# Patient Record
Sex: Female | Born: 1945 | Race: Black or African American | Hispanic: No | Smoking: Former smoker
Health system: Southern US, Community
[De-identification: ages and names within clinical notes are randomized; demographics above are authoritative.]

## PROBLEM LIST (undated history)

## (undated) DIAGNOSIS — J449 Chronic obstructive pulmonary disease, unspecified: Secondary | ICD-10-CM

## (undated) DIAGNOSIS — J189 Pneumonia, unspecified organism: Secondary | ICD-10-CM

## (undated) DIAGNOSIS — R51 Headache: Secondary | ICD-10-CM

## (undated) DIAGNOSIS — I10 Essential (primary) hypertension: Secondary | ICD-10-CM

## (undated) DIAGNOSIS — E119 Type 2 diabetes mellitus without complications: Secondary | ICD-10-CM

## (undated) DIAGNOSIS — N390 Urinary tract infection, site not specified: Secondary | ICD-10-CM

## (undated) DIAGNOSIS — G4730 Sleep apnea, unspecified: Secondary | ICD-10-CM

## (undated) DIAGNOSIS — R519 Headache, unspecified: Secondary | ICD-10-CM

## (undated) DIAGNOSIS — G4733 Obstructive sleep apnea (adult) (pediatric): Secondary | ICD-10-CM

## (undated) DIAGNOSIS — D649 Anemia, unspecified: Secondary | ICD-10-CM

## (undated) DIAGNOSIS — M1990 Unspecified osteoarthritis, unspecified site: Secondary | ICD-10-CM

## (undated) DIAGNOSIS — J45909 Unspecified asthma, uncomplicated: Secondary | ICD-10-CM

## (undated) HISTORY — PX: FOOT SURGERY: SHX648

## (undated) HISTORY — DX: Obstructive sleep apnea (adult) (pediatric): G47.33

---

## 2004-02-20 ENCOUNTER — Encounter (INDEPENDENT_AMBULATORY_CARE_PROVIDER_SITE_OTHER): Payer: Self-pay | Admitting: *Deleted

## 2004-02-20 LAB — CONVERTED CEMR LAB

## 2004-03-20 ENCOUNTER — Encounter: Admission: RE | Admit: 2004-03-20 | Discharge: 2004-03-20 | Payer: Self-pay | Admitting: Family Medicine

## 2004-04-05 ENCOUNTER — Encounter: Admission: RE | Admit: 2004-04-05 | Discharge: 2004-04-05 | Payer: Self-pay | Admitting: Sports Medicine

## 2004-04-11 ENCOUNTER — Encounter: Admission: RE | Admit: 2004-04-11 | Discharge: 2004-04-11 | Payer: Self-pay | Admitting: Sports Medicine

## 2004-04-22 ENCOUNTER — Encounter: Admission: RE | Admit: 2004-04-22 | Discharge: 2004-04-22 | Payer: Self-pay | Admitting: Sports Medicine

## 2004-04-24 ENCOUNTER — Encounter: Admission: RE | Admit: 2004-04-24 | Discharge: 2004-04-24 | Payer: Self-pay | Admitting: Family Medicine

## 2004-08-05 ENCOUNTER — Encounter: Admission: RE | Admit: 2004-08-05 | Discharge: 2004-08-05 | Payer: Self-pay | Admitting: Family Medicine

## 2004-11-28 ENCOUNTER — Encounter: Admission: RE | Admit: 2004-11-28 | Discharge: 2004-11-28 | Payer: Self-pay | Admitting: Occupational Medicine

## 2004-12-23 ENCOUNTER — Encounter: Admission: RE | Admit: 2004-12-23 | Discharge: 2005-01-08 | Payer: Self-pay | Admitting: Nurse Practitioner

## 2005-03-17 ENCOUNTER — Emergency Department (HOSPITAL_COMMUNITY): Admission: EM | Admit: 2005-03-17 | Discharge: 2005-03-17 | Payer: Self-pay | Admitting: Emergency Medicine

## 2005-03-21 ENCOUNTER — Ambulatory Visit (HOSPITAL_COMMUNITY): Admission: RE | Admit: 2005-03-21 | Discharge: 2005-03-21 | Payer: Self-pay | Admitting: Family Medicine

## 2005-03-21 ENCOUNTER — Emergency Department (HOSPITAL_COMMUNITY): Admission: EM | Admit: 2005-03-21 | Discharge: 2005-03-21 | Payer: Self-pay | Admitting: Emergency Medicine

## 2005-03-22 ENCOUNTER — Emergency Department (HOSPITAL_COMMUNITY): Admission: EM | Admit: 2005-03-22 | Discharge: 2005-03-22 | Payer: Self-pay | Admitting: Family Medicine

## 2005-03-31 ENCOUNTER — Ambulatory Visit: Payer: Self-pay | Admitting: Internal Medicine

## 2005-04-10 ENCOUNTER — Ambulatory Visit: Payer: Self-pay | Admitting: Internal Medicine

## 2005-04-16 ENCOUNTER — Encounter (HOSPITAL_COMMUNITY): Admission: RE | Admit: 2005-04-16 | Discharge: 2005-07-15 | Payer: Self-pay | Admitting: Internal Medicine

## 2005-05-27 ENCOUNTER — Ambulatory Visit: Payer: Self-pay | Admitting: *Deleted

## 2005-05-27 ENCOUNTER — Ambulatory Visit: Payer: Self-pay | Admitting: Internal Medicine

## 2005-06-03 ENCOUNTER — Ambulatory Visit: Payer: Self-pay | Admitting: Internal Medicine

## 2005-08-07 ENCOUNTER — Ambulatory Visit: Payer: Self-pay | Admitting: Internal Medicine

## 2006-01-14 ENCOUNTER — Ambulatory Visit: Payer: Self-pay | Admitting: Internal Medicine

## 2006-01-28 ENCOUNTER — Emergency Department (HOSPITAL_COMMUNITY): Admission: EM | Admit: 2006-01-28 | Discharge: 2006-01-28 | Payer: Self-pay | Admitting: Emergency Medicine

## 2006-03-20 ENCOUNTER — Ambulatory Visit: Payer: Self-pay | Admitting: Internal Medicine

## 2006-04-22 ENCOUNTER — Encounter: Admission: RE | Admit: 2006-04-22 | Discharge: 2006-06-17 | Payer: Self-pay | Admitting: Orthopedic Surgery

## 2006-08-20 ENCOUNTER — Ambulatory Visit: Payer: Self-pay | Admitting: Internal Medicine

## 2006-10-19 ENCOUNTER — Emergency Department (HOSPITAL_COMMUNITY): Admission: EM | Admit: 2006-10-19 | Discharge: 2006-10-19 | Payer: Self-pay | Admitting: Emergency Medicine

## 2006-11-23 ENCOUNTER — Ambulatory Visit: Payer: Self-pay | Admitting: Internal Medicine

## 2006-12-30 ENCOUNTER — Emergency Department (HOSPITAL_COMMUNITY): Admission: EM | Admit: 2006-12-30 | Discharge: 2006-12-30 | Payer: Self-pay | Admitting: Emergency Medicine

## 2007-01-25 ENCOUNTER — Ambulatory Visit: Payer: Self-pay | Admitting: Internal Medicine

## 2007-02-18 DIAGNOSIS — Z87891 Personal history of nicotine dependence: Secondary | ICD-10-CM | POA: Insufficient documentation

## 2007-02-18 DIAGNOSIS — M545 Low back pain, unspecified: Secondary | ICD-10-CM | POA: Insufficient documentation

## 2007-02-18 DIAGNOSIS — J309 Allergic rhinitis, unspecified: Secondary | ICD-10-CM | POA: Insufficient documentation

## 2007-02-18 DIAGNOSIS — M79609 Pain in unspecified limb: Secondary | ICD-10-CM | POA: Insufficient documentation

## 2007-02-18 DIAGNOSIS — F17200 Nicotine dependence, unspecified, uncomplicated: Secondary | ICD-10-CM | POA: Insufficient documentation

## 2007-02-18 DIAGNOSIS — J45909 Unspecified asthma, uncomplicated: Secondary | ICD-10-CM | POA: Insufficient documentation

## 2007-02-19 ENCOUNTER — Encounter (INDEPENDENT_AMBULATORY_CARE_PROVIDER_SITE_OTHER): Payer: Self-pay | Admitting: *Deleted

## 2007-07-29 ENCOUNTER — Ambulatory Visit: Payer: Self-pay | Admitting: Internal Medicine

## 2007-07-29 LAB — CONVERTED CEMR LAB
ALT: 22 units/L (ref 0–35)
AST: 17 units/L (ref 0–37)
Albumin: 3.8 g/dL (ref 3.5–5.2)
Alkaline Phosphatase: 156 units/L — ABNORMAL HIGH (ref 39–117)
BUN: 13 mg/dL (ref 6–23)
CO2: 23 meq/L (ref 19–32)
Calcium: 9.7 mg/dL (ref 8.4–10.5)
Chloride: 102 meq/L (ref 96–112)
Creatinine, Ser: 0.62 mg/dL (ref 0.40–1.20)
Free Thyroxine Index: 10.9 — ABNORMAL HIGH (ref 1.0–3.9)
Glucose, Bld: 355 mg/dL — ABNORMAL HIGH (ref 70–99)
Microalb, Ur: 0.55 mg/dL (ref 0.00–1.89)
Potassium: 4.4 meq/L (ref 3.5–5.3)
Sodium: 138 meq/L (ref 135–145)
T3 Uptake Ratio: 53 % — ABNORMAL HIGH (ref 22.5–37.0)
T4, Total: 20.6 ug/dL — ABNORMAL HIGH (ref 5.0–12.5)
TSH: 0.004 microintl units/mL — ABNORMAL LOW (ref 0.350–5.50)
Total Bilirubin: 0.3 mg/dL (ref 0.3–1.2)
Total Protein: 7.1 g/dL (ref 6.0–8.3)

## 2007-08-02 ENCOUNTER — Ambulatory Visit (HOSPITAL_COMMUNITY): Admission: RE | Admit: 2007-08-02 | Discharge: 2007-08-02 | Payer: Self-pay | Admitting: Internal Medicine

## 2007-08-19 ENCOUNTER — Ambulatory Visit: Payer: Self-pay | Admitting: Internal Medicine

## 2007-09-08 ENCOUNTER — Encounter (INDEPENDENT_AMBULATORY_CARE_PROVIDER_SITE_OTHER): Payer: Self-pay | Admitting: *Deleted

## 2007-12-06 ENCOUNTER — Ambulatory Visit: Payer: Self-pay | Admitting: Internal Medicine

## 2007-12-07 ENCOUNTER — Encounter: Payer: Self-pay | Admitting: Internal Medicine

## 2007-12-07 LAB — CONVERTED CEMR LAB
ALT: 40 units/L — ABNORMAL HIGH (ref 0–35)
AST: 29 units/L (ref 0–37)
Albumin: 4.3 g/dL (ref 3.5–5.2)
Alkaline Phosphatase: 186 units/L — ABNORMAL HIGH (ref 39–117)
BUN: 17 mg/dL (ref 6–23)
Basophils Absolute: 0 10*3/uL (ref 0.0–0.1)
Basophils Relative: 0 % (ref 0–1)
CO2: 25 meq/L (ref 19–32)
Calcium: 10.1 mg/dL (ref 8.4–10.5)
Chloride: 104 meq/L (ref 96–112)
Creatinine, Ser: 0.54 mg/dL (ref 0.40–1.20)
Eosinophils Absolute: 0.4 10*3/uL (ref 0.2–0.7)
Eosinophils Relative: 4 % (ref 0–5)
Free T4: 4.37 ng/dL — ABNORMAL HIGH (ref 0.89–1.80)
Glucose, Bld: 104 mg/dL — ABNORMAL HIGH (ref 70–99)
HCT: 43.6 % (ref 36.0–46.0)
Hemoglobin: 14.4 g/dL (ref 12.0–15.0)
Lymphocytes Relative: 33 % (ref 12–46)
Lymphs Abs: 3.4 10*3/uL (ref 0.7–4.0)
MCHC: 33 g/dL (ref 30.0–36.0)
MCV: 82.4 fL (ref 78.0–100.0)
Monocytes Absolute: 0.9 10*3/uL (ref 0.1–1.0)
Monocytes Relative: 9 % (ref 3–12)
Neutro Abs: 5.5 10*3/uL (ref 1.7–7.7)
Neutrophils Relative %: 54 % (ref 43–77)
Platelets: 251 10*3/uL (ref 150–400)
Potassium: 4.4 meq/L (ref 3.5–5.3)
RBC: 5.29 M/uL — ABNORMAL HIGH (ref 3.87–5.11)
RDW: 13.9 % (ref 11.5–15.5)
Sodium: 141 meq/L (ref 135–145)
TSH: <0.004 microintl units/mL — ABNORMAL LOW (ref 0.350–5.50)
Total Bilirubin: 0.4 mg/dL (ref 0.3–1.2)
Total Protein: 8 g/dL (ref 6.0–8.3)
WBC: 10.1 10*3/uL (ref 4.0–10.5)

## 2007-12-14 ENCOUNTER — Ambulatory Visit: Payer: Self-pay | Admitting: Internal Medicine

## 2007-12-21 ENCOUNTER — Ambulatory Visit: Payer: Self-pay | Admitting: Internal Medicine

## 2007-12-21 LAB — CONVERTED CEMR LAB
Free Thyroxine Index: 5.3 — ABNORMAL HIGH (ref 1.0–3.9)
T3 Uptake Ratio: 39.8 % — ABNORMAL HIGH (ref 22.5–37.0)
T4, Total: 13.3 ug/dL — ABNORMAL HIGH (ref 5.0–12.5)
TSH: 0.008 microintl units/mL — ABNORMAL LOW (ref 0.350–5.50)

## 2008-01-07 ENCOUNTER — Ambulatory Visit: Payer: Self-pay | Admitting: Internal Medicine

## 2008-01-07 LAB — CONVERTED CEMR LAB
Free Thyroxine Index: 5.4 — ABNORMAL HIGH (ref 1.0–3.9)
T3 Uptake Ratio: 37.4 % — ABNORMAL HIGH (ref 22.5–37.0)
T4, Total: 14.5 ug/dL — ABNORMAL HIGH (ref 5.0–12.5)
TSH: <0.004 microintl units/mL — ABNORMAL LOW (ref 0.350–5.50)

## 2008-03-09 ENCOUNTER — Ambulatory Visit: Payer: Self-pay | Admitting: Internal Medicine

## 2008-03-09 LAB — CONVERTED CEMR LAB
ALT: 20 units/L (ref 0–35)
AST: 22 units/L (ref 0–37)
Albumin: 4.7 g/dL (ref 3.5–5.2)
Alkaline Phosphatase: 209 units/L — ABNORMAL HIGH (ref 39–117)
BUN: 17 mg/dL (ref 6–23)
CO2: 24 meq/L (ref 19–32)
Calcium: 10.1 mg/dL (ref 8.4–10.5)
Chloride: 103 meq/L (ref 96–112)
Creatinine, Ser: 0.57 mg/dL (ref 0.40–1.20)
Free T4: 1.64 ng/dL (ref 0.89–1.80)
Glucose, Bld: 120 mg/dL — ABNORMAL HIGH (ref 70–99)
Potassium: 4.7 meq/L (ref 3.5–5.3)
Sodium: 137 meq/L (ref 135–145)
TSH: 0.014 microintl units/mL — ABNORMAL LOW (ref 0.350–5.50)
Total Bilirubin: 0.5 mg/dL (ref 0.3–1.2)
Total Protein: 8.4 g/dL — ABNORMAL HIGH (ref 6.0–8.3)

## 2008-05-08 ENCOUNTER — Ambulatory Visit: Payer: Self-pay | Admitting: Internal Medicine

## 2008-05-16 ENCOUNTER — Ambulatory Visit: Payer: Self-pay | Admitting: Internal Medicine

## 2008-05-25 ENCOUNTER — Ambulatory Visit (HOSPITAL_COMMUNITY): Admission: RE | Admit: 2008-05-25 | Discharge: 2008-05-25 | Payer: Self-pay | Admitting: Internal Medicine

## 2009-12-28 ENCOUNTER — Ambulatory Visit: Payer: Self-pay | Admitting: Family Medicine

## 2009-12-28 ENCOUNTER — Encounter (INDEPENDENT_AMBULATORY_CARE_PROVIDER_SITE_OTHER): Payer: Self-pay | Admitting: Family Medicine

## 2009-12-28 LAB — CONVERTED CEMR LAB
ALT: 19 units/L (ref 0–35)
AST: 20 units/L (ref 0–37)
Albumin: 4.5 g/dL (ref 3.5–5.2)
Alkaline Phosphatase: 84 units/L (ref 39–117)
BUN: 10 mg/dL (ref 6–23)
Basophils Absolute: 0.1 10*3/uL (ref 0.0–0.1)
Basophils Relative: 1 % (ref 0–1)
CO2: 24 meq/L (ref 19–32)
Calcium: 9.1 mg/dL (ref 8.4–10.5)
Chloride: 103 meq/L (ref 96–112)
Creatinine, Ser: 0.7 mg/dL (ref 0.40–1.20)
Eosinophils Absolute: 0.3 10*3/uL (ref 0.0–0.7)
Eosinophils Relative: 4 % (ref 0–5)
Glucose, Bld: 201 mg/dL — ABNORMAL HIGH (ref 70–99)
HCT: 41 % (ref 36.0–46.0)
Hemoglobin: 13.2 g/dL (ref 12.0–15.0)
Hgb A1c MFr Bld: 8.7 % — ABNORMAL HIGH (ref 4.6–6.1)
Lymphocytes Relative: 35 % (ref 12–46)
Lymphs Abs: 3.1 10*3/uL (ref 0.7–4.0)
MCHC: 32.2 g/dL (ref 30.0–36.0)
MCV: 91.3 fL (ref 78.0–100.0)
Monocytes Absolute: 0.7 10*3/uL (ref 0.1–1.0)
Monocytes Relative: 8 % (ref 3–12)
Neutro Abs: 4.6 10*3/uL (ref 1.7–7.7)
Neutrophils Relative %: 53 % (ref 43–77)
Platelets: 210 10*3/uL (ref 150–400)
Potassium: 4.1 meq/L (ref 3.5–5.3)
RBC: 4.49 M/uL (ref 3.87–5.11)
RDW: 13.3 % (ref 11.5–15.5)
Sodium: 139 meq/L (ref 135–145)
T3 Uptake Ratio: 34.5 % (ref 22.5–37.0)
T4, Total: 10.6 ug/dL (ref 5.0–12.5)
TSH: 1.176 microintl units/mL (ref 0.350–4.500)
Total Bilirubin: 0.4 mg/dL (ref 0.3–1.2)
Total Protein: 7.6 g/dL (ref 6.0–8.3)
Vit D, 25-Hydroxy: 17 ng/mL — ABNORMAL LOW (ref 30–89)
WBC: 8.7 10*3/uL (ref 4.0–10.5)

## 2010-01-11 ENCOUNTER — Ambulatory Visit: Payer: Self-pay | Admitting: Internal Medicine

## 2010-01-11 LAB — CONVERTED CEMR LAB
Cholesterol: 184 mg/dL (ref 0–200)
HDL: 46 mg/dL (ref >39)
LDL Cholesterol: 113 mg/dL — ABNORMAL HIGH (ref 0–99)
Total CHOL/HDL Ratio: 4
Triglycerides: 125 mg/dL (ref <150)
VLDL: 25 mg/dL (ref 0–40)

## 2010-04-08 ENCOUNTER — Ambulatory Visit: Payer: Self-pay | Admitting: Family Medicine

## 2016-12-22 DIAGNOSIS — N390 Urinary tract infection, site not specified: Secondary | ICD-10-CM

## 2016-12-22 HISTORY — DX: Urinary tract infection, site not specified: N39.0

## 2017-06-29 ENCOUNTER — Encounter: Payer: Self-pay | Admitting: Emergency Medicine

## 2017-06-29 ENCOUNTER — Inpatient Hospital Stay (HOSPITAL_COMMUNITY)
Admission: EM | Admit: 2017-06-29 | Discharge: 2017-07-04 | DRG: 871 | Disposition: A | Discharge status: Home / Self Care | Payer: Medicare (Managed Care) | Attending: Internal Medicine | Admitting: Internal Medicine

## 2017-06-29 ENCOUNTER — Inpatient Hospital Stay (HOSPITAL_COMMUNITY): Payer: Medicare (Managed Care)

## 2017-06-29 ENCOUNTER — Emergency Department (HOSPITAL_COMMUNITY): Payer: Medicare (Managed Care)

## 2017-06-29 DIAGNOSIS — R1110 Vomiting, unspecified: Secondary | ICD-10-CM

## 2017-06-29 DIAGNOSIS — D649 Anemia, unspecified: Secondary | ICD-10-CM

## 2017-06-29 DIAGNOSIS — R531 Weakness: Secondary | ICD-10-CM | POA: Diagnosis present

## 2017-06-29 DIAGNOSIS — R400 Somnolence: Secondary | ICD-10-CM

## 2017-06-29 DIAGNOSIS — G4733 Obstructive sleep apnea (adult) (pediatric): Secondary | ICD-10-CM | POA: Diagnosis present

## 2017-06-29 DIAGNOSIS — M4316 Spondylolisthesis, lumbar region: Secondary | ICD-10-CM | POA: Diagnosis present

## 2017-06-29 DIAGNOSIS — D500 Iron deficiency anemia secondary to blood loss (chronic): Secondary | ICD-10-CM | POA: Diagnosis present

## 2017-06-29 DIAGNOSIS — K31819 Angiodysplasia of stomach and duodenum without bleeding: Secondary | ICD-10-CM | POA: Diagnosis present

## 2017-06-29 DIAGNOSIS — M5440 Lumbago with sciatica, unspecified side: Secondary | ICD-10-CM | POA: Diagnosis not present

## 2017-06-29 DIAGNOSIS — J440 Chronic obstructive pulmonary disease with acute lower respiratory infection: Secondary | ICD-10-CM | POA: Diagnosis present

## 2017-06-29 DIAGNOSIS — K5730 Diverticulosis of large intestine without perforation or abscess without bleeding: Secondary | ICD-10-CM | POA: Diagnosis present

## 2017-06-29 DIAGNOSIS — M545 Low back pain, unspecified: Secondary | ICD-10-CM

## 2017-06-29 DIAGNOSIS — N170 Acute kidney failure with tubular necrosis: Secondary | ICD-10-CM | POA: Diagnosis not present

## 2017-06-29 DIAGNOSIS — A4151 Sepsis due to Escherichia coli [E. coli]: Secondary | ICD-10-CM | POA: Diagnosis present

## 2017-06-29 DIAGNOSIS — R42 Dizziness and giddiness: Secondary | ICD-10-CM

## 2017-06-29 DIAGNOSIS — Z87891 Personal history of nicotine dependence: Secondary | ICD-10-CM

## 2017-06-29 DIAGNOSIS — J209 Acute bronchitis, unspecified: Secondary | ICD-10-CM | POA: Diagnosis present

## 2017-06-29 DIAGNOSIS — R059 Cough, unspecified: Secondary | ICD-10-CM

## 2017-06-29 DIAGNOSIS — E1169 Type 2 diabetes mellitus with other specified complication: Secondary | ICD-10-CM | POA: Diagnosis present

## 2017-06-29 DIAGNOSIS — E538 Deficiency of other specified B group vitamins: Secondary | ICD-10-CM | POA: Diagnosis present

## 2017-06-29 DIAGNOSIS — Z7982 Long term (current) use of aspirin: Secondary | ICD-10-CM

## 2017-06-29 DIAGNOSIS — K625 Hemorrhage of anus and rectum: Secondary | ICD-10-CM | POA: Diagnosis present

## 2017-06-29 DIAGNOSIS — K648 Other hemorrhoids: Secondary | ICD-10-CM | POA: Diagnosis present

## 2017-06-29 DIAGNOSIS — Z6834 Body mass index (BMI) 34.0-34.9, adult: Secondary | ICD-10-CM

## 2017-06-29 DIAGNOSIS — R7881 Bacteremia: Secondary | ICD-10-CM | POA: Diagnosis present

## 2017-06-29 DIAGNOSIS — E861 Hypovolemia: Secondary | ICD-10-CM | POA: Diagnosis present

## 2017-06-29 DIAGNOSIS — R05 Cough: Secondary | ICD-10-CM

## 2017-06-29 DIAGNOSIS — Z8249 Family history of ischemic heart disease and other diseases of the circulatory system: Secondary | ICD-10-CM

## 2017-06-29 DIAGNOSIS — K644 Residual hemorrhoidal skin tags: Secondary | ICD-10-CM | POA: Diagnosis present

## 2017-06-29 DIAGNOSIS — Z833 Family history of diabetes mellitus: Secondary | ICD-10-CM

## 2017-06-29 DIAGNOSIS — K921 Melena: Secondary | ICD-10-CM | POA: Diagnosis not present

## 2017-06-29 DIAGNOSIS — R6521 Severe sepsis with septic shock: Secondary | ICD-10-CM | POA: Diagnosis present

## 2017-06-29 DIAGNOSIS — A419 Sepsis, unspecified organism: Secondary | ICD-10-CM | POA: Diagnosis present

## 2017-06-29 DIAGNOSIS — E871 Hypo-osmolality and hyponatremia: Secondary | ICD-10-CM | POA: Diagnosis present

## 2017-06-29 DIAGNOSIS — R195 Other fecal abnormalities: Secondary | ICD-10-CM | POA: Diagnosis not present

## 2017-06-29 DIAGNOSIS — N179 Acute kidney failure, unspecified: Secondary | ICD-10-CM | POA: Diagnosis present

## 2017-06-29 DIAGNOSIS — N3000 Acute cystitis without hematuria: Secondary | ICD-10-CM | POA: Insufficient documentation

## 2017-06-29 DIAGNOSIS — N800 Endometriosis of uterus: Secondary | ICD-10-CM | POA: Diagnosis present

## 2017-06-29 DIAGNOSIS — I10 Essential (primary) hypertension: Secondary | ICD-10-CM | POA: Diagnosis present

## 2017-06-29 DIAGNOSIS — D508 Other iron deficiency anemias: Secondary | ICD-10-CM | POA: Diagnosis not present

## 2017-06-29 DIAGNOSIS — N390 Urinary tract infection, site not specified: Secondary | ICD-10-CM | POA: Diagnosis present

## 2017-06-29 DIAGNOSIS — R319 Hematuria, unspecified: Secondary | ICD-10-CM

## 2017-06-29 DIAGNOSIS — E119 Type 2 diabetes mellitus without complications: Secondary | ICD-10-CM | POA: Diagnosis present

## 2017-06-29 DIAGNOSIS — E1165 Type 2 diabetes mellitus with hyperglycemia: Secondary | ICD-10-CM | POA: Diagnosis not present

## 2017-06-29 DIAGNOSIS — D251 Intramural leiomyoma of uterus: Secondary | ICD-10-CM | POA: Diagnosis present

## 2017-06-29 DIAGNOSIS — D269 Other benign neoplasm of uterus, unspecified: Secondary | ICD-10-CM | POA: Diagnosis not present

## 2017-06-29 DIAGNOSIS — N939 Abnormal uterine and vaginal bleeding, unspecified: Secondary | ICD-10-CM

## 2017-06-29 DIAGNOSIS — E669 Obesity, unspecified: Secondary | ICD-10-CM | POA: Diagnosis present

## 2017-06-29 DIAGNOSIS — Z7984 Long term (current) use of oral hypoglycemic drugs: Secondary | ICD-10-CM

## 2017-06-29 DIAGNOSIS — D509 Iron deficiency anemia, unspecified: Secondary | ICD-10-CM | POA: Diagnosis not present

## 2017-06-29 HISTORY — DX: Type 2 diabetes mellitus without complications: E11.9

## 2017-06-29 LAB — URINALYSIS, ROUTINE W REFLEX MICROSCOPIC
Bilirubin Urine: NEGATIVE
GLUCOSE, UA: 50 mg/dL — AB
KETONES UR: NEGATIVE mg/dL
NITRITE: NEGATIVE
PROTEIN: 30 mg/dL — AB
Specific Gravity, Urine: 1.006 (ref 1.005–1.030)
pH: 5 (ref 5.0–8.0)

## 2017-06-29 LAB — I-STAT TROPONIN, ED: Troponin i, poc: 0.02 ng/mL (ref 0.00–0.08)

## 2017-06-29 LAB — BLOOD GAS, ARTERIAL
Acid-base deficit: 3.2 mmol/L — ABNORMAL HIGH (ref 0.0–2.0)
BICARBONATE: 20.3 mmol/L (ref 20.0–28.0)
Drawn by: 308601
FIO2: 21
O2 Saturation: 96.6 %
PATIENT TEMPERATURE: 98.6
PH ART: 7.435 (ref 7.350–7.450)
PO2 ART: 86.6 mmHg (ref 83.0–108.0)
pCO2 arterial: 30.8 mmHg — ABNORMAL LOW (ref 32.0–48.0)

## 2017-06-29 LAB — BASIC METABOLIC PANEL
Anion gap: 10 (ref 5–15)
BUN: 21 mg/dL — ABNORMAL HIGH (ref 6–20)
CO2: 22 mmol/L (ref 22–32)
CREATININE: 2.05 mg/dL — AB (ref 0.44–1.00)
Calcium: 8.2 mg/dL — ABNORMAL LOW (ref 8.9–10.3)
Chloride: 94 mmol/L — ABNORMAL LOW (ref 101–111)
GFR calc Af Amer: 28 mL/min — ABNORMAL LOW (ref >60)
GFR calc non Af Amer: 24 mL/min — ABNORMAL LOW (ref >60)
Glucose, Bld: 329 mg/dL — ABNORMAL HIGH (ref 65–99)
POTASSIUM: 4.2 mmol/L (ref 3.5–5.1)
Sodium: 126 mmol/L — ABNORMAL LOW (ref 135–145)

## 2017-06-29 LAB — IRON AND TIBC
IRON: 5 ug/dL — AB (ref 28–170)
Saturation Ratios: 1 % — ABNORMAL LOW (ref 10.4–31.8)
TIBC: 398 ug/dL (ref 250–450)
UIBC: 393 ug/dL

## 2017-06-29 LAB — LIPASE, BLOOD: LIPASE: 26 U/L (ref 11–51)

## 2017-06-29 LAB — FOLATE: FOLATE: 25.9 ng/mL (ref >5.9)

## 2017-06-29 LAB — CBC WITH DIFFERENTIAL/PLATELET
Basophils Absolute: 0 10*3/uL (ref 0.0–0.1)
Basophils Relative: 0 %
EOS ABS: 0 10*3/uL (ref 0.0–0.7)
Eosinophils Relative: 0 %
HCT: 17.5 % — ABNORMAL LOW (ref 36.0–46.0)
Hemoglobin: 5.1 g/dL — CL (ref 12.0–15.0)
Lymphocytes Relative: 4 %
Lymphs Abs: 0.9 10*3/uL (ref 0.7–4.0)
MCH: 17.6 pg — ABNORMAL LOW (ref 26.0–34.0)
MCHC: 29.1 g/dL — ABNORMAL LOW (ref 30.0–36.0)
MCV: 60.6 fL — ABNORMAL LOW (ref 78.0–100.0)
Monocytes Absolute: 2.6 10*3/uL — ABNORMAL HIGH (ref 0.1–1.0)
Monocytes Relative: 11 %
Neutro Abs: 20.1 10*3/uL — ABNORMAL HIGH (ref 1.7–7.7)
Neutrophils Relative %: 85 %
PLATELETS: 348 10*3/uL (ref 150–400)
RBC: 2.89 MIL/uL — ABNORMAL LOW (ref 3.87–5.11)
RDW: 18.2 % — ABNORMAL HIGH (ref 11.5–15.5)
WBC MORPHOLOGY: INCREASED
WBC: 23.6 10*3/uL — ABNORMAL HIGH (ref 4.0–10.5)

## 2017-06-29 LAB — HEPATIC FUNCTION PANEL
ALBUMIN: 2.8 g/dL — AB (ref 3.5–5.0)
ALK PHOS: 58 U/L (ref 38–126)
ALT: 20 U/L (ref 14–54)
AST: 34 U/L (ref 15–41)
BILIRUBIN TOTAL: 0.7 mg/dL (ref 0.3–1.2)
Bilirubin, Direct: 0.3 mg/dL (ref 0.1–0.5)
Indirect Bilirubin: 0.4 mg/dL (ref 0.3–0.9)
TOTAL PROTEIN: 6.2 g/dL — AB (ref 6.5–8.1)

## 2017-06-29 LAB — RETICULOCYTES
RBC.: 2.85 MIL/uL — ABNORMAL LOW (ref 3.87–5.11)
Retic Count, Absolute: 48.5 10*3/uL (ref 19.0–186.0)
Retic Ct Pct: 1.7 % (ref 0.4–3.1)

## 2017-06-29 LAB — APTT: APTT: 41 s — AB (ref 24–36)

## 2017-06-29 LAB — FERRITIN: FERRITIN: 32 ng/mL (ref 11–307)

## 2017-06-29 LAB — GLUCOSE, CAPILLARY
GLUCOSE-CAPILLARY: 185 mg/dL — AB (ref 65–99)
Glucose-Capillary: 265 mg/dL — ABNORMAL HIGH (ref 65–99)
Glucose-Capillary: 246 mg/dL — ABNORMAL HIGH (ref 65–99)

## 2017-06-29 LAB — HEMOGLOBIN AND HEMATOCRIT, BLOOD
HCT: 20.3 % — ABNORMAL LOW (ref 36.0–46.0)
HEMOGLOBIN: 6.1 g/dL — AB (ref 12.0–15.0)

## 2017-06-29 LAB — PREPARE RBC (CROSSMATCH)

## 2017-06-29 LAB — VITAMIN B12: Vitamin B-12: 335 pg/mL (ref 180–914)

## 2017-06-29 LAB — ABO/RH: ABO/RH(D): O POS

## 2017-06-29 LAB — MRSA PCR SCREENING: MRSA by PCR: NEGATIVE

## 2017-06-29 LAB — PROTIME-INR
INR: 1.22
PROTHROMBIN TIME: 15.5 s — AB (ref 11.4–15.2)

## 2017-06-29 LAB — LACTIC ACID, PLASMA
Lactic Acid, Venous: 2.2 mmol/L (ref 0.5–1.9)
Lactic Acid, Venous: 1.2 mmol/L (ref 0.5–1.9)

## 2017-06-29 LAB — POC OCCULT BLOOD, ED: Fecal Occult Bld: NEGATIVE

## 2017-06-29 LAB — PROCALCITONIN: PROCALCITONIN: 8.98 ng/mL

## 2017-06-29 MED ORDER — PIPERACILLIN-TAZOBACTAM 3.375 G IVPB 30 MIN: 3.3750 g | Freq: Once | INTRAVENOUS | Status: DC

## 2017-06-29 MED ORDER — SODIUM CHLORIDE 0.9 % IV SOLN
Freq: Once | INTRAVENOUS | Status: AC
  Administered 2017-06-29: 22:00:00 via INTRAVENOUS

## 2017-06-29 MED ORDER — ONDANSETRON HCL 4 MG/2ML IJ SOLN: 4.0000 mg | Freq: Four times a day (QID) | INTRAMUSCULAR | Status: DC | PRN

## 2017-06-29 MED ORDER — AZITHROMYCIN 500 MG IV SOLR
500.0000 mg | Freq: Once | INTRAVENOUS | Status: AC
  Administered 2017-06-29: 500 mg via INTRAVENOUS
  Filled 2017-06-29: qty 500

## 2017-06-29 MED ORDER — ACETAMINOPHEN 325 MG PO TABS
650.0000 mg | ORAL_TABLET | Freq: Four times a day (QID) | ORAL | Status: DC | PRN
  Administered 2017-06-29 – 2017-07-02 (×6): 650 mg via ORAL
  Filled 2017-06-29 (×5): qty 2

## 2017-06-29 MED ORDER — SODIUM CHLORIDE 0.9 % IV SOLN
10.0000 mL/h | Freq: Once | INTRAVENOUS | Status: AC
  Administered 2017-06-29: 10 mL/h via INTRAVENOUS

## 2017-06-29 MED ORDER — IPRATROPIUM-ALBUTEROL 0.5-2.5 (3) MG/3ML IN SOLN: 3.0000 mL | Freq: Four times a day (QID) | RESPIRATORY_TRACT | Status: DC | PRN

## 2017-06-29 MED ORDER — ONDANSETRON HCL 4 MG/2ML IJ SOLN
4.0000 mg | Freq: Once | INTRAMUSCULAR | Status: AC
  Administered 2017-06-29: 4 mg via INTRAVENOUS
  Filled 2017-06-29: qty 2

## 2017-06-29 MED ORDER — ONDANSETRON HCL 4 MG PO TABS: 4.0000 mg | ORAL_TABLET | Freq: Four times a day (QID) | ORAL | Status: DC | PRN

## 2017-06-29 MED ORDER — SODIUM CHLORIDE 0.9 % IV BOLUS (SEPSIS)
500.0000 mL | Freq: Once | INTRAVENOUS | Status: AC
  Administered 2017-06-29: 500 mL via INTRAVENOUS

## 2017-06-29 MED ORDER — SODIUM CHLORIDE 0.9 % IV BOLUS (SEPSIS)
2000.0000 mL | Freq: Once | INTRAVENOUS | Status: AC
  Administered 2017-06-29: 2000 mL via INTRAVENOUS

## 2017-06-29 MED ORDER — DEXTROSE 5 % IV SOLN
1.0000 g | Freq: Once | INTRAVENOUS | Status: AC
  Administered 2017-06-29: 1 g via INTRAVENOUS
  Filled 2017-06-29: qty 10

## 2017-06-29 MED ORDER — SODIUM CHLORIDE 0.9 % IV SOLN
INTRAVENOUS | Status: AC
  Administered 2017-06-29 (×2): 100 mL/h via INTRAVENOUS

## 2017-06-29 MED ORDER — SODIUM CHLORIDE 0.9 % IV BOLUS (SEPSIS): 2000.0000 mL | Freq: Once | INTRAVENOUS | Status: DC

## 2017-06-29 MED ORDER — DEXTROSE 5 % IV SOLN
500.0000 mg | INTRAVENOUS | Status: DC
  Administered 2017-06-30: 500 mg via INTRAVENOUS
  Filled 2017-06-29: qty 500

## 2017-06-29 MED ORDER — ALBUTEROL SULFATE (2.5 MG/3ML) 0.083% IN NEBU
5.0000 mg | INHALATION_SOLUTION | Freq: Once | RESPIRATORY_TRACT | Status: AC
  Administered 2017-06-29: 5 mg via RESPIRATORY_TRACT
  Filled 2017-06-29: qty 6

## 2017-06-29 MED ORDER — LIP MEDEX EX OINT
TOPICAL_OINTMENT | CUTANEOUS | Status: AC
  Administered 2017-06-29: 14:00:00
  Filled 2017-06-29: qty 7

## 2017-06-29 MED ORDER — INSULIN ASPART 100 UNIT/ML ~~LOC~~ SOLN
0.0000 [IU] | Freq: Three times a day (TID) | SUBCUTANEOUS | Status: DC
  Administered 2017-06-29: 5 [IU] via SUBCUTANEOUS
  Administered 2017-06-29 – 2017-06-30 (×4): 3 [IU] via SUBCUTANEOUS
  Administered 2017-07-01: 2 [IU] via SUBCUTANEOUS

## 2017-06-29 MED ORDER — DEXTROSE 5 % IV SOLN
1.0000 g | INTRAVENOUS | Status: DC
  Administered 2017-06-30: 1 g via INTRAVENOUS
  Filled 2017-06-29 (×2): qty 10

## 2017-06-29 MED ORDER — ACETAMINOPHEN 325 MG PO TABS
650.0000 mg | ORAL_TABLET | Freq: Once | ORAL | Status: AC
  Administered 2017-06-29: 650 mg via ORAL
  Filled 2017-06-29: qty 2

## 2017-06-29 MED ORDER — ACETAMINOPHEN 650 MG RE SUPP: 650.0000 mg | Freq: Four times a day (QID) | RECTAL | Status: DC | PRN

## 2017-06-29 MED ORDER — VANCOMYCIN HCL IN DEXTROSE 1-5 GM/200ML-% IV SOLN: 1000.0000 mg | Freq: Once | INTRAVENOUS | Status: DC

## 2017-06-29 NOTE — ED Notes (Signed)
Second set of blood cultures sent. *Ordered and collected after pt had antibiotics*

## 2017-06-29 NOTE — ED Notes (Signed)
Patient transported to CT 

## 2017-06-29 NOTE — ED Notes (Signed)
Called 4W, per secretary, bed not approved yet. Will check again in about 15 min.

## 2017-06-29 NOTE — ED Notes (Signed)
Bed: HC62 Expected date:  Expected time:  Means of arrival:  Comments: 71 yo F/Fever N/V

## 2017-06-29 NOTE — ED Provider Notes (Signed)
[facility] DEPT Provider Note   CSN: [id number] Arrival date & time: 06/29/17  0022  By signing my name below, I, Ny'Kea Lewis, attest that this documentation has been prepared under the direction and in the presence of Plato, Aleksey Newbern, MD. Electronically Signed: Lise Auer, ED Scribe. 06/29/17. 2:08 AM.  History   Chief Complaint Chief Complaint  Patient presents with  . Nausea  . Emesis   The history is provided by the patient and a relative. No language interpreter was used.  Cough  This is a chronic problem. The current episode started more than 1 week ago. The problem occurs constantly. The problem has not changed since onset.The cough is productive of sputum. There has been no fever. Pertinent negatives include no chest pain and no wheezing. She has tried nothing for the symptoms. The treatment provided no relief. She is a smoker. Her past medical history does not include pneumonia.   HPI Comments: [name] [name] is a 71 y.o. female with a history of diabetes, brought in by ambulance, who presents to the Emergency Department complaining of acute on chronic vomiting for the past two months. Pt notes associated fatigue, decreased appetite, cough, and a subjective fever. Pt reports she has been vomiting and coughing up thick mucus for the past two months. Pt's accompanying party suggested that pt come in. She reports that the pt has been sleeping all day and had a decreased appetite. Pt is currently on Ramipril. She has note taken any medication to relieve her systems. Denies tobacco or alcohol usage. Denies melena, hematochezia, or blood in stool.  Past Medical History:  Diagnosis Date  . Diabetes mellitus without complication [facility])    Patient Active Problem List   Diagnosis Date Noted  . TOBACCO DEPENDENCE 02/18/2007  . RHINITIS, ALLERGIC 02/18/2007  . ASTHMA, UNSPECIFIED 02/18/2007  . BACK PAIN, LOW 02/18/2007  . LEG PAIN OR KNEE PAIN 02/18/2007   History reviewed. No  pertinent surgical history.  OB History    No data available     Home Medications    Prior to Admission medications   Not on File   Family History No family history on file.  Social History Social History  Substance Use Topics  . Smoking status: Not on file  . Smokeless tobacco: Not on file  . Alcohol use Not on file   Allergies   Patient has no allergy information on record.  Review of Systems Review of Systems  Constitutional: Positive for fatigue.  Respiratory: Positive for cough. Negative for wheezing.   Cardiovascular: Negative for chest pain.  Gastrointestinal: Positive for vomiting. Negative for blood in stool.  Genitourinary: Negative for dysuria.  All other systems reviewed and are negative.  Physical Exam Updated Vital Signs BP (!) 96/49 (BP Location: Left Arm)   Pulse (!) 113   Temp 99.7 F (37.6 C) (Oral)   Resp 16   SpO2 (!) 89%   Physical Exam  Constitutional: She appears well-developed and well-nourished.  HENT:  Head: Normocephalic.  Mouth/Throat: Oropharynx is clear and moist. No oropharyngeal exudate.  Eyes: Conjunctivae and EOM are normal. Pupils are equal, round, and reactive to light. Right eye exhibits no discharge. Left eye exhibits no discharge. No scleral icterus.  Neck: Normal range of motion. Neck supple. No JVD present. No tracheal deviation present.  Trachea is midline. No stridor or carotid bruits.   Cardiovascular: Normal rate, regular rhythm, normal heart sounds and intact distal pulses.   No murmur heard. Diminished heart sounds.  Pulmonary/Chest: Effort normal and breath sounds normal. No stridor. Tachypnea noted. No respiratory distress. She has no wheezes. She has no rales.  Diminished breath sounds.   Abdominal: Soft. Bowel sounds are normal. She exhibits no distension. There is no tenderness. There is no rebound and no guarding.  Genitourinary: Rectal exam shows guaiac negative stool.  Genitourinary Comments: Chaperone  present. No gross blood. Rectum exam was normal.   Musculoskeletal: Normal range of motion. She exhibits no edema () or tenderness.  All compartments are soft. No palpable cords.   Lymphadenopathy:    She has no cervical adenopathy.  Neurological: She is alert. She has normal reflexes. She displays normal reflexes. She exhibits normal muscle tone.  Skin: Skin is warm and dry. Capillary refill takes less than 2 seconds. There is pallor.  Psychiatric: She has a normal mood and affect. Her behavior is normal.  Nursing note and vitals reviewed.  ED Treatments / Results   Vitals:   06/29/17 0430 06/29/17 0445  BP: (!) 97/56 98/68  Pulse: (!) 112 (!) 108  Resp: (!) 26 (!) 26  Temp:      DIAGNOSTIC STUDIES: Oxygen Saturation is 89% on RA, low by my interpretation.   COORDINATION OF CARE: 1:54 AM-Discussed next steps with pt. Pt verbalized understanding and is agreeable with the plan.   Labs (all labs ordered are listed, but only abnormal results are displayed)  Results for orders placed or performed during the hospital encounter of 06/29/17  CBC with Differential  Result Value Ref Range   WBC 23.6 (H) 4.0 - 10.5 K/uL   RBC 2.89 (L) 3.87 - 5.11 MIL/uL   Hemoglobin 5.1 (LL) 12.0 - 15.0 g/dL   HCT 17.5 (L) 36.0 - 46.0 %   MCV 60.6 (L) 78.0 - 100.0 fL   MCH 17.6 (L) 26.0 - 34.0 pg   MCHC 29.1 (L) 30.0 - 36.0 g/dL   RDW 18.2 (H) 11.5 - 15.5 %   Platelets 348 150 - 400 K/uL   Neutrophils Relative % 85 %   Lymphocytes Relative 4 %   Monocytes Relative 11 %   Eosinophils Relative 0 %   Basophils Relative 0 %   Neutro Abs 20.1 (H) 1.7 - 7.7 K/uL   Lymphs Abs 0.9 0.7 - 4.0 K/uL   Monocytes Absolute 2.6 (H) 0.1 - 1.0 K/uL   Eosinophils Absolute 0.0 0.0 - 0.7 K/uL   Basophils Absolute 0.0 0.0 - 0.1 K/uL   RBC Morphology POLYCHROMASIA PRESENT    WBC Morphology INCREASED BANDS (>20% BANDS)   Basic metabolic panel  Result Value Ref Range   Sodium 126 (L) 135 - 145 mmol/L    Potassium 4.2 3.5 - 5.1 mmol/L   Chloride 94 (L) 101 - 111 mmol/L   CO2 22 22 - 32 mmol/L   Glucose, Bld 329 (H) 65 - 99 mg/dL   BUN 21 (H) 6 - 20 mg/dL   Creatinine, Ser 2.05 (H) 0.44 - 1.00 mg/dL   Calcium 8.2 (L) 8.9 - 10.3 mg/dL   GFR calc non Af Amer 24 (L) >60 mL/min   GFR calc Af Amer 28 (L) >60 mL/min   Anion gap 10 5 - 15  Lipase, blood  Result Value Ref Range   Lipase 26 11 - 51 U/L  Urinalysis, Routine w reflex microscopic  Result Value Ref Range   Color, Urine YELLOW YELLOW   APPearance CLOUDY (A) CLEAR   Specific Gravity, Urine 1.006 1.005 - 1.030   pH 5.0  5.0 - 8.0   Glucose, UA 50 (A) NEGATIVE mg/dL   Hgb urine dipstick MODERATE (A) NEGATIVE   Bilirubin Urine NEGATIVE NEGATIVE   Ketones, ur NEGATIVE NEGATIVE mg/dL   Protein, ur 30 (A) NEGATIVE mg/dL   Nitrite NEGATIVE NEGATIVE   Leukocytes, UA LARGE (A) NEGATIVE   RBC / HPF 6-30 0 - 5 RBC/hpf   WBC, UA TOO NUMEROUS TO COUNT 0 - 5 WBC/hpf   Bacteria, UA MANY (A) NONE SEEN   Squamous Epithelial / LPF 0-5 (A) NONE SEEN   Mucous PRESENT    Hyaline Casts, UA PRESENT   Blood gas, arterial ([facility] & [facility] ONLY)  Result Value Ref Range   FIO2 21.00    Mode PRESSURE REGULATED VOLUME CONTROL    pH, Arterial 7.435 7.350 - 7.450   pCO2 arterial 30.8 (L) 32.0 - 48.0 mmHg   pO2, Arterial 86.6 83.0 - 108.0 mmHg   Bicarbonate 20.3 20.0 - 28.0 mmol/L   Acid-base deficit 3.2 (H) 0.0 - 2.0 mmol/L   O2 Saturation 96.6 %   Patient temperature 98.6    Collection site LEFT RADIAL    Drawn by [id number]    Sample type ARTERIAL DRAW    Allens test (pass/fail) PASS PASS  POC occult blood, ED  Result Value Ref Range   Fecal Occult Bld NEGATIVE NEGATIVE  I-stat troponin, ED  Result Value Ref Range   Troponin i, poc 0.02 0.00 - 0.08 ng/mL   Comment 3          Type and screen [place]  Result Value Ref Range   ABO/RH(D) O POS    Antibody Screen NEG    Sample Expiration 07/02/2017    Unit Number [id number]     Blood Component Type RED CELLS,LR    Unit division 00    Status of Unit ALLOCATED    Transfusion Status OK TO TRANSFUSE    Crossmatch Result Compatible    Unit Number [id number]    Blood Component Type RED CELLS,LR    Unit division 00    Status of Unit ALLOCATED    Transfusion Status OK TO TRANSFUSE    Crossmatch Result Compatible   Prepare RBC  Result Value Ref Range   Order Confirmation ORDER PROCESSED BY BLOOD BANK   ABO/Rh  Result Value Ref Range   ABO/RH(D) O POS   BPAM RBC  Result Value Ref Range   Blood Product Unit Number [id number]    Unit Type and Rh 5100    Blood Product Expiration Date 301601093235    Blood Product Unit Number [id number]    Unit Type and Rh 5100    Blood Product Expiration Date [id number]    Dg Abd Acute W/chest  Result Date: 06/29/2017 CLINICAL DATA:  71 year old female with weakness and mid abdominal pain. EXAM: DG ABDOMEN ACUTE W/ 1V CHEST COMPARISON:  Chest radiograph dated 08/02/2007 FINDINGS: Mild diffuse interstitial coarsening primarily involving the lung bases. No focal consolidation, pleural effusion, or pneumothorax. The cardiac silhouette is within normal limits. There degenerative changes of the shoulders. No acute osseous pathology. No bowel dilatation or evidence of obstruction. No free air or radiopaque calculi. The osseous structures and soft tissues appear unremarkable. IMPRESSION: Negative abdominal radiographs.  No acute cardiopulmonary disease. Electronically Signed   By: Anner Crete M.D.   On: 06/29/2017 01:51     EKG Interpretation  Date/Time:  Monday June 29 2017 02:54:57 EDT Ventricular Rate:  112 PR Interval:    QRS  Duration: 92 QT Interval:  317 QTC Calculation: 433 R Axis:   77 Text Interpretation:  Sinus tachycardia Confirmed by Randal Buba, Akhila Mahnken ([id number]) on 06/29/2017 4:31:53 AM        Procedures Procedures (including critical care time)  Medications Ordered in ED  Medications  sodium chloride 0.9  % bolus 2,000 mL (0 mLs Intravenous Stopped 06/29/17 0248)  cefTRIAXone (ROCEPHIN) 1 g in dextrose 5 % 50 mL IVPB (0 g Intravenous Stopped 06/29/17 0258)  azithromycin (ZITHROMAX) 500 mg in dextrose 5 % 250 mL IVPB (0 mg Intravenous Stopped 06/29/17 0450)  0.9 %  sodium chloride infusion (10 mL/hr Intravenous New Bag/Given 06/29/17 0232)  ondansetron (ZOFRAN) injection 4 mg (4 mg Intravenous Given 06/29/17 0227)  albuterol (PROVENTIL) (2.5 MG/3ML) 0.083% nebulizer solution 5 mg (5 mg Nebulization Given 06/29/17 0211)   Blood transfusion  Final Clinical Impressions(s) / ED Diagnoses  Will admit for transfusion and IV antibiotics and renal failure   Niang Mitcheltree, MD 06/29/17 6147

## 2017-06-29 NOTE — Progress Notes (Signed)
PROGRESS NOTE    [name] [name]  JJK:[id number] DOB: 1946-03-13 DOA: 06/29/2017 PCP: System, Pcp Not In    Brief Narrative: HPI: [name] [name] is a 71 y.o. female with history of diabetes mellitus type 2 who is visiting her daughter in [place] and is about to go to [place] back in 2-3 days started experiencing dizziness on exertion with nausea and poor appetite and weakness. Patient also has subjective feeling of fever and chills. Denies any diarrhea. Denies any chest pain but has been having persistent productive cough. Patient states he was started on Lasix last month at [place] for lower extremity edema. Patient states she also takes ramipril. Patient says many years ago she has had a cardiac cath but does not have any stents.  ED Course: In the ER patient is found to be febrile and tachycardic with leukocytosis and hemoglobin of 5. Patient states she is not aware that patient has had anemia previously. Labs also showed renal failure. Patient is hypotensive. Chest x-ray does not show anything acute. UA shows features consistent with UTI. Blood cultures obtained patient was started on fluid bolus and admitted for sepsis and severe anemia. Stool for occult blood as being negative. Patient states she has never had any colonoscopy previously.    Assessment & Plan:   Principal Problem:   Sepsis ([place]) Active Problems:   Acute lower UTI   Diabetes mellitus type 2 in obese (HCC)   Microcytic hypochromic anemia   ARF (acute renal failure) (HCC)  Sepsis; UTI Hypotension; Presents with hypotension, fever, leukocytosis.  Admit to step down unit. She is alert and in no distress. IV bolus, IV antibiotic.  Check lactic acid.  If hypotension persist will consult CCM.    Hyponatremia; suspect related to hypovolemia, renal failure.  IV fluids.  Repeat labs in am.   Nausea, vomiting; Cough  she vomit after she cough/  [place], no cholelithiasis.  Check LFT.  Support care.  Repeat chest x ray  tomorrow after hydration.   History of COPD; start nebulizer.   Acute renal failure; in setting of hypovolemia, hypotension.  IV fluids. Repeat labs in am.  Hold ACE and metformin.   DM; SSI/  Hold metformin.   Severe Anemia;  Occult blood negative.  Repeat occult blood. Denies bleeding. She doesn't know if she has had black stool.  Await anemia panel.  Getting 2 units of PRBC.  Repeat hb after transfusion.  Might need GI evaluation at some point.   UTI; IV ceftriaxone. Follow urine culture.    DVT prophylaxis: SCD Code Status: full code.  Family Communication: daughter was at bedside.  Disposition Plan: admit step down unit    Consultants:   none   Procedures:   [place];    Antimicrobials:  Ceftriaxone  Azithro   Subjective: She is alert, talking, she report vomiting after she cough, phlegm./  Denies abdominal pain.   Objective: Vitals:   06/29/17 0600 06/29/17 0646 06/29/17 0730 06/29/17 0731  BP: (!) 103/56  (!) 87/49 (!) 95/50  Pulse: (!) 112  (!) 101 (!) 102  Resp: (!) 21  (!) 23 (!) 27  Temp:  (S) (!) 101.2 F (38.4 C)    TempSrc:  (S) Oral    SpO2: 100%  91% 93%   No intake or output data in the 24 hours ending 06/29/17 0825 There were no vitals filed for this visit.  Examination:  General exam: Appears calm and comfortable  Respiratory system: Clear to auscultation.  Respiratory effort normal. Cardiovascular system: S1 & S2 heard, RRR. No JVD, murmurs, rubs, gallops or clicks. No pedal edema. Gastrointestinal system: Abdomen is nondistended, soft and nontender. No organomegaly or masses felt. Normal bowel sounds heard. Central nervous system: Alert and oriented. No focal neurological deficits. Extremities: Symmetric 5 x 5 power. Skin: No rashes, lesions or ulcers Psychiatry: Judgement and insight appear normal. Mood & affect appropriate.     Data Reviewed: I have personally reviewed following labs and imaging studies  CBC:  Recent  Labs Lab 06/29/17 0040  WBC 23.6*  NEUTROABS 20.1*  HGB 5.1*  HCT 17.5*  MCV 60.6*  PLT 277   Basic Metabolic Panel:  Recent Labs Lab 06/29/17 0040  NA 126*  K 4.2  CL 94*  CO2 22  GLUCOSE 329*  BUN 21*  CREATININE 2.05*  CALCIUM 8.2*   GFR: CrCl cannot be calculated (Unknown ideal weight.). Liver Function Tests: No results for input(s): AST, ALT, ALKPHOS, BILITOT, PROT, ALBUMIN in the last 168 hours.  Recent Labs Lab 06/29/17 0040  LIPASE 26   No results for input(s): AMMONIA in the last 168 hours. Coagulation Profile: No results for input(s): INR, PROTIME in the last 168 hours. Cardiac Enzymes: No results for input(s): CKTOTAL, CKMB, CKMBINDEX, TROPONINI in the last 168 hours. BNP (last 3 results) No results for input(s): PROBNP in the last 8760 hours. HbA1C: No results for input(s): HGBA1C in the last 72 hours. CBG: No results for input(s): GLUCAP in the last 168 hours. Lipid Profile: No results for input(s): CHOL, HDL, LDLCALC, TRIG, CHOLHDL, LDLDIRECT in the last 72 hours. Thyroid Function Tests: No results for input(s): TSH, T4TOTAL, FREET4, T3FREE, THYROIDAB in the last 72 hours. Anemia Panel:  Recent Labs  06/29/17 0040  RETICCTPCT 1.7   Sepsis Labs: No results for input(s): PROCALCITON, LATICACIDVEN in the last 168 hours.  No results found for this or any previous visit (from the past 240 hour(s)).       Radiology Studies: Ct Head Wo Contrast  Result Date: 06/29/2017 CLINICAL DATA:  71 year old female with dizziness and fatigue. History of diabetes. EXAM: CT HEAD WITHOUT CONTRAST TECHNIQUE: Contiguous axial images were obtained from the base of the skull through the vertex without intravenous contrast. COMPARISON:  None. FINDINGS: Brain: There is mild age-related atrophy and chronic microvascular ischemic changes. No acute intracranial hemorrhage. No mass effect or midline shift. No extra-axial fluid collection. Vascular: No hyperdense  vessel or unexpected calcification. Skull: Normal. Negative for fracture or focal lesion. Sinuses/Orbits: No acute finding. Bilateral exophthalmos. Clinical correlation is recommended. There is mild thickened appearance of the ocular muscles which may represent thyroid opthalmopathy. Clinical correlation is recommended. Other: None IMPRESSION: 1. No acute intracranial pathology. 2. Mild age-related atrophy and chronic microvascular ischemic changes. 3. Bilateral exophthalmos may be related to underlying thyroid disease. Electronically Signed   By: Anner Crete M.D.   On: 06/29/2017 05:48   US Abdomen Complete  Result Date: 06/29/2017 CLINICAL DATA:  Initial evaluation for acute dizziness. EXAM: ABDOMEN ULTRASOUND COMPLETE COMPARISON:  Prior radiograph from earlier same day. FINDINGS: Gallbladder: No gallstones or wall thickening visualized. A positive sonographic Murphy sign was elicited on exam. Common bile duct: Diameter: 2.2 mm Liver: No focal lesion identified. Within normal limits in parenchymal echogenicity. IVC: No abnormality visualized. Pancreas: Visualized portion unremarkable. Spleen: Size and appearance within normal limits. Right Kidney: Length: 12.9 cm. Echogenicity within normal limits. No mass or hydronephrosis visualized. Left Kidney: Length: 10.9 cm. Echogenicity within normal limits.  No mass or hydronephrosis visualized. Abdominal aorta: No aneurysm visualized. Other findings: None. IMPRESSION: 1. Positive sonographic Murphy's sign without cholelithiasis or other sonographic features to suggest acute cholecystitis. No biliary dilatation. 2. Otherwise unremarkable abdominal ultrasound. Electronically Signed   By: Jeannine Boga M.D.   On: 06/29/2017 06:50   Dg Abd Acute W/chest  Result Date: 06/29/2017 CLINICAL DATA:  71 year old female with weakness and mid abdominal pain. EXAM: DG ABDOMEN ACUTE W/ 1V CHEST COMPARISON:  Chest radiograph dated 08/02/2007 FINDINGS: Mild diffuse  interstitial coarsening primarily involving the lung bases. No focal consolidation, pleural effusion, or pneumothorax. The cardiac silhouette is within normal limits. There degenerative changes of the shoulders. No acute osseous pathology. No bowel dilatation or evidence of obstruction. No free air or radiopaque calculi. The osseous structures and soft tissues appear unremarkable. IMPRESSION: Negative abdominal radiographs.  No acute cardiopulmonary disease. Electronically Signed   By: Anner Crete M.D.   On: 06/29/2017 01:51        Scheduled Meds: . insulin aspart  0-9 Units Subcutaneous TID WC   Continuous Infusions: . sodium chloride    . sodium chloride       LOS: 0 days    Time spent: 35 minutes.     Elmarie Shiley, MD [facility] Pager [phone]  If 7PM-7AM, please contact night-coverage [email or web address] Password [email or web address] 06/29/2017, 8:25 AM

## 2017-06-29 NOTE — ED Triage Notes (Signed)
Per EMS pt complaining of nausea, vomiting, and dizziness. CBG 450. Pt states she only takes metformin 1000mg  twice a day and states she has taken all medications for today. Pt A&O x 4 but delayed in answering questions with EMS. EMS placed 18g in the left AC. Pt denies any pain at this time.   93/54 HR 112 96% RA

## 2017-06-29 NOTE — ED Notes (Signed)
Pt has elevated temperature, blood was returned to blood bank, Dr Hal Hope notified, VO for tylenol 650 received.

## 2017-06-29 NOTE — Progress Notes (Signed)
Dr. Frederic Jericho notified of lactic acid 2.2, hgb 6.1 Orders received

## 2017-06-29 NOTE — ED Notes (Signed)
NOTE:  1st unit of RBC that was released was returned back to blood bank d/t pt's elevated temperature; pt's 2 units of RBC in blood bank.

## 2017-06-29 NOTE — H&P (Signed)
History and Physical    [name] [name]:[id number] DOB: 05-04-1946 DOA: 06/29/2017  PCP: System, Pcp Not In  Patient coming from: Home.  Chief Complaint: Dizziness weakness and nausea.  HPI: [name] [name] is a 71 y.o. female with history of diabetes mellitus type 2 who is visiting [name] daughter in [place] and is about to go to [place] back in 2-3 days started experiencing dizziness on exertion with nausea and poor appetite and weakness. Patient also has subjective feeling of fever and chills. Denies any diarrhea. Denies any chest pain but has been having persistent productive cough. Patient states he was started on Lasix last month at [place] for lower extremity edema. Patient states she also takes ramipril. Patient says many years ago she has had a cardiac cath but does not have any stents.  ED Course: In the ER patient is found to be febrile and tachycardic with leukocytosis and hemoglobin of 5. Patient states she is not aware that patient has had anemia previously. Labs also showed renal failure. Patient is hypotensive. Chest x-ray does not show anything acute. UA shows features consistent with UTI. Blood cultures obtained patient was started on fluid bolus and admitted for sepsis and severe anemia. Stool for occult blood as being negative. Patient states she has never had any colonoscopy previously.  Review of Systems: As per HPI, rest all negative.   Past Medical History:  Diagnosis Date  . Diabetes mellitus without complication [facility])     Past Surgical History:  Procedure Laterality Date  . CESAREAN SECTION       reports that she has quit smoking. She has never used smokeless tobacco. She reports that she drinks alcohol. [name] drug history is not on file.  No Known Allergies  Family History  Problem Relation Age of Onset  . CAD Neg Hx   . Diabetes Mellitus II Neg Hx     Prior to Admission medications   Medication Sig Start Date End Date Taking? Authorizing Provider    aspirin EC 81 MG tablet Take 81 mg by mouth daily.   Yes [provider]  metFORMIN (GLUCOPHAGE) 1000 MG tablet Take 1,000 mg by mouth 2 (two) times daily with a meal.   Yes [provider]    Physical Exam: Vitals:   06/29/17 0425 06/29/17 0430 06/29/17 0445 06/29/17 0543  BP: (!) 92/51 (!) 97/56 98/68 (!) 97/54  Pulse: (!) 115 (!) 112 (!) 108 (!) 111  Resp: (!) 21 (!) 26 (!) 26 20  Temp:    (S) (!) 103.1 F (39.5 C)  TempSrc:    (S) Oral  SpO2: 100% 99% 99% 100%      Constitutional: Obese not in distress. Vitals:   06/29/17 0425 06/29/17 0430 06/29/17 0445 06/29/17 0543  BP: (!) 92/51 (!) 97/56 98/68 (!) 97/54  Pulse: (!) 115 (!) 112 (!) 108 (!) 111  Resp: (!) 21 (!) 26 (!) 26 20  Temp:    (S) (!) 103.1 F (39.5 C)  TempSrc:    (S) Oral  SpO2: 100% 99% 99% 100%   Eyes: Anicteric no pallor. ENMT: No discharge from the ears eyes nose and mouth. Neck: No JVD appreciated no mass felt. Respiratory: No rhonchi or crepitations. Cardiovascular: S1-S2 heard no murmurs appreciated. Abdomen: Soft nontender bowel sounds present. Musculoskeletal: No edema. No joint effusion. Skin: No rash or skin appears warm. Neurologic: Alert awake oriented to time place and person. Moves all extremities. Psychiatric: Appears normal. Normal affect.  Labs on Admission: I have personally reviewed following labs and imaging studies  CBC:  Recent Labs Lab 06/29/17 0040  WBC 23.6*  NEUTROABS 20.1*  HGB 5.1*  HCT 17.5*  MCV 60.6*  PLT 176   Basic Metabolic Panel:  Recent Labs Lab 06/29/17 0040  NA 126*  K 4.2  CL 94*  CO2 22  GLUCOSE 329*  BUN 21*  CREATININE 2.05*  CALCIUM 8.2*   GFR: CrCl cannot be calculated (Unknown ideal weight.). Liver Function Tests: No results for input(s): AST, ALT, ALKPHOS, BILITOT, PROT, ALBUMIN in the last 168 hours.  Recent Labs Lab 06/29/17 0040  LIPASE 26   No results for input(s): AMMONIA in the last 168  hours. Coagulation Profile: No results for input(s): INR, PROTIME in the last 168 hours. Cardiac Enzymes: No results for input(s): CKTOTAL, CKMB, CKMBINDEX, TROPONINI in the last 168 hours. BNP (last 3 results) No results for input(s): PROBNP in the last 8760 hours. HbA1C: No results for input(s): HGBA1C in the last 72 hours. CBG: No results for input(s): GLUCAP in the last 168 hours. Lipid Profile: No results for input(s): CHOL, HDL, LDLCALC, TRIG, CHOLHDL, LDLDIRECT in the last 72 hours. Thyroid Function Tests: No results for input(s): TSH, T4TOTAL, FREET4, T3FREE, THYROIDAB in the last 72 hours. Anemia Panel:  Recent Labs  06/29/17 0040  RETICCTPCT 1.7   Urine analysis:    Component Value Date/Time   COLORURINE YELLOW 06/29/2017 0255   APPEARANCEUR CLOUDY (A) 06/29/2017 0255   LABSPEC 1.006 06/29/2017 0255   PHURINE 5.0 06/29/2017 0255   GLUCOSEU 50 (A) 06/29/2017 0255   HGBUR MODERATE (A) 06/29/2017 0255   BILIRUBINUR NEGATIVE 06/29/2017 0255   KETONESUR NEGATIVE 06/29/2017 0255   PROTEINUR 30 (A) 06/29/2017 0255   NITRITE NEGATIVE 06/29/2017 0255   LEUKOCYTESUR LARGE (A) 06/29/2017 0255   Sepsis Labs: @LABRCNTIP (procalcitonin:4,lacticidven:4) )No results found for this or any previous visit (from the past 240 hour(s)).   Radiological Exams on Admission: Ct Head Wo Contrast  Result Date: 06/29/2017 CLINICAL DATA:  71 year old female with dizziness and fatigue. History of diabetes. EXAM: CT HEAD WITHOUT CONTRAST TECHNIQUE: Contiguous axial images were obtained from the base of the skull through the vertex without intravenous contrast. COMPARISON:  None. FINDINGS: Brain: There is mild age-related atrophy and chronic microvascular ischemic changes. No acute intracranial hemorrhage. No mass effect or midline shift. No extra-axial fluid collection. Vascular: No hyperdense vessel or unexpected calcification. Skull: Normal. Negative for fracture or focal lesion.  Sinuses/Orbits: No acute finding. Bilateral exophthalmos. Clinical correlation is recommended. There is mild thickened appearance of the ocular muscles which may represent thyroid opthalmopathy. Clinical correlation is recommended. Other: None IMPRESSION: 1. No acute intracranial pathology. 2. Mild age-related atrophy and chronic microvascular ischemic changes. 3. Bilateral exophthalmos may be related to underlying thyroid disease. Electronically Signed   By: Anner Crete M.D.   On: 06/29/2017 05:48   Dg Abd Acute W/chest  Result Date: 06/29/2017 CLINICAL DATA:  71 year old female with weakness and mid abdominal pain. EXAM: DG ABDOMEN ACUTE W/ 1V CHEST COMPARISON:  Chest radiograph dated 08/02/2007 FINDINGS: Mild diffuse interstitial coarsening primarily involving the lung bases. No focal consolidation, pleural effusion, or pneumothorax. The cardiac silhouette is within normal limits. There degenerative changes of the shoulders. No acute osseous pathology. No bowel dilatation or evidence of obstruction. No free air or radiopaque calculi. The osseous structures and soft tissues appear unremarkable. IMPRESSION: Negative abdominal radiographs.  No acute cardiopulmonary disease. Electronically Signed   By: Milas Hock  Radparvar M.D.   On: 06/29/2017 01:51    EKG: Independently reviewed. Sinus tachycardia.  Assessment/Plan Principal Problem:   Sepsis ([place]) Active Problems:   Acute lower UTI   Diabetes mellitus type 2 in obese (HCC)   Microcytic hypochromic anemia   ARF (acute renal failure) ([place])    1. Sepsis likely source could be UTI - patient is on attempting antibiotics follow cultures continue hydration for lactate levels procalcitonin levels. 2. Nausea - I have ordered abdominal sonogram both to check for any gallbladder pathology and also to rule out any urinary obstruction. We'll also check LFTs and lipase. 3. Severe anemia microcytic hypochromic - stool for blood has been negative. Check  anemia panel. Transfuse 2 units of PRBC. 4. Diabetes mellitus type 2 - for now I'll place patient on sliding scale coverage. Follow CBGs closely. 5. Acute renal failure - no old labs to compare. Gently hydrated and closely follow metabolic panel. Follow sonogram of abdomen for any obstruction. 6. Patient states she does take ramipril and Lasix which will be held.   DVT prophylaxis: SCDs. Code Status: Full code.  Family Communication: Discussed with patient.  Disposition Plan: Home.  Consults called: None.  Admission status: Inpatient.    Rise Patience MD [facility] Pager [phone].  If 7PM-7AM, please contact night-coverage [email or web address] Password [email or web address]  06/29/2017, 6:02 AM

## 2017-06-29 NOTE — ED Notes (Signed)
Pt ambulated to bathroom, got very lightheaded, and had to sit down in the hallway. Wheel chair was brought on and pt assisted to bathroom and back to room.

## 2017-06-29 NOTE — ED Notes (Signed)
Admitting MD at the bedside.  

## 2017-06-30 ENCOUNTER — Inpatient Hospital Stay (HOSPITAL_COMMUNITY): Payer: Medicare (Managed Care)

## 2017-06-30 ENCOUNTER — Encounter (HOSPITAL_COMMUNITY): Payer: Self-pay | Admitting: Radiology

## 2017-06-30 DIAGNOSIS — D509 Iron deficiency anemia, unspecified: Secondary | ICD-10-CM

## 2017-06-30 LAB — BASIC METABOLIC PANEL
Anion gap: 10 (ref 5–15)
BUN: 16 mg/dL (ref 6–20)
CALCIUM: 8.2 mg/dL — AB (ref 8.9–10.3)
CO2: 22 mmol/L (ref 22–32)
CREATININE: 1.34 mg/dL — AB (ref 0.44–1.00)
Chloride: 102 mmol/L (ref 101–111)
GFR calc Af Amer: 46 mL/min — ABNORMAL LOW (ref >60)
GFR calc non Af Amer: 40 mL/min — ABNORMAL LOW (ref >60)
Glucose, Bld: 260 mg/dL — ABNORMAL HIGH (ref 65–99)
Potassium: 4.1 mmol/L (ref 3.5–5.1)
SODIUM: 134 mmol/L — AB (ref 135–145)

## 2017-06-30 LAB — CBC
HCT: 26.1 % — ABNORMAL LOW (ref 36.0–46.0)
Hemoglobin: 8.4 g/dL — ABNORMAL LOW (ref 12.0–15.0)
MCH: 21.7 pg — AB (ref 26.0–34.0)
MCHC: 32.2 g/dL (ref 30.0–36.0)
MCV: 67.4 fL — ABNORMAL LOW (ref 78.0–100.0)
PLATELETS: 294 10*3/uL (ref 150–400)
RBC: 3.87 MIL/uL (ref 3.87–5.11)
RDW: 22.8 % — AB (ref 11.5–15.5)
WBC: 21.9 10*3/uL — ABNORMAL HIGH (ref 4.0–10.5)

## 2017-06-30 LAB — TYPE AND SCREEN
ABO/RH(D): O POS
ANTIBODY SCREEN: NEGATIVE
UNIT DIVISION: 0
UNIT DIVISION: 0
Unit division: 0
Unit division: 0

## 2017-06-30 LAB — BLOOD CULTURE ID PANEL (REFLEXED)
ACINETOBACTER BAUMANNII: NOT DETECTED
CANDIDA ALBICANS: NOT DETECTED
CANDIDA KRUSEI: NOT DETECTED
CANDIDA PARAPSILOSIS: NOT DETECTED
CARBAPENEM RESISTANCE: NOT DETECTED
Candida glabrata: NOT DETECTED
Candida tropicalis: NOT DETECTED
ENTEROBACTER CLOACAE COMPLEX: NOT DETECTED
ENTEROBACTERIACEAE SPECIES: DETECTED — AB
ENTEROCOCCUS SPECIES: NOT DETECTED
Escherichia coli: DETECTED — AB
Haemophilus influenzae: NOT DETECTED
KLEBSIELLA OXYTOCA: NOT DETECTED
KLEBSIELLA PNEUMONIAE: NOT DETECTED
LISTERIA MONOCYTOGENES: NOT DETECTED
Neisseria meningitidis: NOT DETECTED
PSEUDOMONAS AERUGINOSA: NOT DETECTED
Proteus species: NOT DETECTED
STAPHYLOCOCCUS AUREUS BCID: NOT DETECTED
STREPTOCOCCUS PNEUMONIAE: NOT DETECTED
STREPTOCOCCUS PYOGENES: NOT DETECTED
STREPTOCOCCUS SPECIES: NOT DETECTED
Serratia marcescens: NOT DETECTED
Staphylococcus species: NOT DETECTED
Streptococcus agalactiae: NOT DETECTED

## 2017-06-30 LAB — BPAM RBC
BLOOD PRODUCT EXPIRATION DATE: 201807282359
BLOOD PRODUCT EXPIRATION DATE: 201807282359
BLOOD PRODUCT EXPIRATION DATE: 201807282359
Blood Product Expiration Date: 201807282359
ISSUE DATE / TIME: 201807090822
ISSUE DATE / TIME: 201807092141
ISSUE DATE / TIME: 201807091212
ISSUE DATE / TIME: 201807092335
UNIT TYPE AND RH: 5100
Unit Type and Rh: 5100
Unit Type and Rh: 5100
Unit Type and Rh: 5100

## 2017-06-30 LAB — GLUCOSE, CAPILLARY
GLUCOSE-CAPILLARY: 248 mg/dL — AB (ref 65–99)
GLUCOSE-CAPILLARY: 249 mg/dL — AB (ref 65–99)
GLUCOSE-CAPILLARY: 217 mg/dL — AB (ref 65–99)
Glucose-Capillary: 206 mg/dL — ABNORMAL HIGH (ref 65–99)

## 2017-06-30 LAB — URINE CULTURE

## 2017-06-30 MED ORDER — DEXTROSE 5 % IV SOLN
2.0000 g | INTRAVENOUS | Status: DC
  Administered 2017-06-30 – 2017-07-02 (×3): 2 g via INTRAVENOUS
  Filled 2017-06-30 (×3): qty 2

## 2017-06-30 MED ORDER — SODIUM CHLORIDE 0.9 % IV SOLN
INTRAVENOUS | Status: DC
  Administered 2017-06-30 – 2017-07-01 (×2): via INTRAVENOUS

## 2017-06-30 MED ORDER — IOPAMIDOL (ISOVUE-300) INJECTION 61%
INTRAVENOUS | Status: AC
  Administered 2017-06-30: 15 mL via ORAL
  Filled 2017-06-30: qty 30

## 2017-06-30 MED ORDER — IOPAMIDOL (ISOVUE-300) INJECTION 61%
15.0000 mL | Freq: Once | INTRAVENOUS | Status: AC | PRN
  Administered 2017-06-30: 15 mL via ORAL

## 2017-06-30 NOTE — Progress Notes (Signed)
PHARMACY - PHYSICIAN COMMUNICATION CRITICAL VALUE ALERT - BLOOD CULTURE IDENTIFICATION (BCID)  Results for orders placed or performed during the hospital encounter of 06/29/17  Blood Culture ID Panel (Reflexed) (Collected: 06/29/2017 12:38 AM)  Result Value Ref Range   Enterococcus species NOT DETECTED NOT DETECTED   Listeria monocytogenes NOT DETECTED NOT DETECTED   Staphylococcus species NOT DETECTED NOT DETECTED   Staphylococcus aureus NOT DETECTED NOT DETECTED   Streptococcus species NOT DETECTED NOT DETECTED   Streptococcus agalactiae NOT DETECTED NOT DETECTED   Streptococcus pneumoniae NOT DETECTED NOT DETECTED   Streptococcus pyogenes NOT DETECTED NOT DETECTED   Acinetobacter baumannii NOT DETECTED NOT DETECTED   Enterobacteriaceae species DETECTED (A) NOT DETECTED   Enterobacter cloacae complex NOT DETECTED NOT DETECTED   Escherichia coli DETECTED (A) NOT DETECTED   Klebsiella oxytoca NOT DETECTED NOT DETECTED   Klebsiella pneumoniae NOT DETECTED NOT DETECTED   Proteus species NOT DETECTED NOT DETECTED   Serratia marcescens NOT DETECTED NOT DETECTED   Carbapenem resistance NOT DETECTED NOT DETECTED   Haemophilus influenzae NOT DETECTED NOT DETECTED   Neisseria meningitidis NOT DETECTED NOT DETECTED   Pseudomonas aeruginosa NOT DETECTED NOT DETECTED   Candida albicans NOT DETECTED NOT DETECTED   Candida glabrata NOT DETECTED NOT DETECTED   Candida krusei NOT DETECTED NOT DETECTED   Candida parapsilosis NOT DETECTED NOT DETECTED   Candida tropicalis NOT DETECTED NOT DETECTED    Name of physician (or Provider) Contacted: Forrest Moron  Changes to prescribed antibiotics required: Increased ceftriaxone to 2 gr IV q24h   Royetta Asal, PharmD, BCPS Pager [phone] 06/30/2017 9:17 PM

## 2017-06-30 NOTE — Progress Notes (Signed)
RT placed patient on CPAP due to patient having periods of apnea. Sterile water added to water chamber for humidification.RT placed patient on auto 8-20 cmH2O for patient comfort. 2 liters oxygen bleed into tubing. Patient is tolerating well at this time. RT will continue to monitor .

## 2017-06-30 NOTE — Progress Notes (Signed)
PROGRESS NOTE    [name]  KVQ:[id number] DOB: [birth year] DOA: 06/29/2017 PCP: System, Pcp Not In    Brief Narrative: HPI: [name] [name] is a 71 y.o. female with history of diabetes mellitus type 2 who is visiting her daughter in [name] and is about to go to [name] back in 2-3 days started experiencing dizziness on exertion with nausea and poor appetite and weakness. Patient also has subjective feeling of fever and chills. Denies any diarrhea. Denies any chest pain but has been having persistent productive cough. Patient states he was started on Lasix last month at [name] for lower extremity edema. Patient states she also takes ramipril. Patient says many years ago she has had a cardiac cath but does not have any stents.  ED Course: In the ER patient is found to be febrile and tachycardic with leukocytosis and hemoglobin of 5. Patient states she is not aware that patient has had anemia previously. Labs also showed renal failure. Patient is hypotensive. Chest x-ray does not show anything acute. UA shows features consistent with UTI. Blood cultures obtained patient was started on fluid bolus and admitted for sepsis and severe anemia. Stool for occult blood as being negative. Patient states she has never had any colonoscopy previously.    Assessment & Plan:   Principal Problem:   Sepsis ([place]) Active Problems:   Acute lower UTI   Diabetes mellitus type 2 in obese (HCC)   Microcytic hypochromic anemia   ARF (acute renal failure) (HCC)  Sepsis; UTI Hypotension; Presents with hypotension, fever, leukocytosis.  Admit to step down unit. She is alert and in no distress. IV bolus, IV antibiotic.  Lactic acid 2.2--1.2 Improved.  continue with ceftriaxone and Zithromax.  WBC trending down 23--21  Hyponatremia; suspect related to hypovolemia, renal failure.  improved with fluids.   Nausea, vomiting; Cough  she vomit after she cough/  [place], no cholelithiasis.  LFT normal.  Support  care.  Chest x ray pleural effusion.   History of COPD;  nebulizer.   Acute Renal Failure; in setting of hypovolemia, hypotension.  Improved with IV fluids and blood transfusion.  Cr peak to 2..0 Hold ACE and metformin.   DM; SSI/  Hold metformin.   Severe Anemia;  Occult blood negative.  Repeat occult blood. Denies bleeding. She doesn't know if she has had black stool.  anemia panel Iron deficiency anemia. She will need screening colonoscopy./  Will get CT abdomen.  Received 4  units of PRBC. ----hb at 7.9. Repeat hb , transfusion as needed.  Will need GI evaluation at some point.  Needs IV iron when infection resolved.   UTI; IV ceftriaxone. Follow urine culture.     DVT prophylaxis: SCD Code Status: full code.  Family Communication: daughter 7-09 Disposition Plan: admit step down unit    Consultants:   none   Procedures:   [place];    Antimicrobials:  Ceftriaxone  Azithro   Subjective: She is feeling better, no significant dizziness.  She use CPAP last night, she will need sleep study   Objective: Vitals:   06/30/17 0500 06/30/17 0503 06/30/17 0509 06/30/17 0600  BP:  (!) 101/59  (!) 100/58  Pulse:  88  86  Resp:  (!) 26  (!) 21  Temp:   98.1 F (36.7 C)   TempSrc:   Oral   SpO2:  99%  100%  Weight: 95 kg (209 lb 7 oz)     Height:  Intake/Output Summary (Last 24 hours) at 06/30/17 0740 Last data filed at 06/30/17 0600  Gross per 24 hour  Intake             3935 ml  Output             1400 ml  Net             2535 ml   Filed Weights   06/29/17 0908 06/29/17 1200 06/30/17 0500  Weight: 89.4 kg (197 lb) 92.4 kg (203 lb 11.3 oz) 95 kg (209 lb 7 oz)    Examination:  General exam: Sitting in chair, no acute distress.  Respiratory system: CTA, normal respiratory effort.  Cardiovascular system: S 1, S 2 RRR Gastrointestinal system: obese, NT Central nervous system: non focal.  Extremities: Symmetric power.  Skin: No rashes, lesions or  ulcers Psychiatry: Judgement and insight appear normal. Mood & affect appropriate.     Data Reviewed: I have personally reviewed following labs and imaging studies  CBC:  Recent Labs Lab 06/29/17 0040 06/29/17 1710 06/30/17 0359  WBC 23.6*  --  21.7*  NEUTROABS 20.1*  --   --   HGB 5.1* 6.1* 7.9*  HCT 17.5* 20.3* 25.1*  MCV 60.6*  --  68.8*  PLT 348  --  768   Basic Metabolic Panel:  Recent Labs Lab 06/29/17 0040 06/30/17 0359  NA 126* 134*  K 4.2 4.1  CL 94* 102  CO2 22 22  GLUCOSE 329* 260*  BUN 21* 16  CREATININE 2.05* 1.34*  CALCIUM 8.2* 8.2*   GFR: Estimated Creatinine Clearance: 45.8 mL/min (A) (by C-G formula based on SCr of 1.34 mg/dL (H)). Liver Function Tests:  Recent Labs Lab 06/29/17 1710  AST 34  ALT 20  ALKPHOS 58  BILITOT 0.7  PROT 6.2*  ALBUMIN 2.8*    Recent Labs Lab 06/29/17 0040  LIPASE 26   No results for input(s): AMMONIA in the last 168 hours. Coagulation Profile:  Recent Labs Lab 06/29/17 1710  INR 1.22   Cardiac Enzymes: No results for input(s): CKTOTAL, CKMB, CKMBINDEX, TROPONINI in the last 168 hours. BNP (last 3 results) No results for input(s): PROBNP in the last 8760 hours. HbA1C: No results for input(s): HGBA1C in the last 72 hours. CBG:  Recent Labs Lab 06/29/17 1203 06/29/17 1534 06/29/17 2033  GLUCAP 265* 246* 185*   Lipid Profile: No results for input(s): CHOL, HDL, LDLCALC, TRIG, CHOLHDL, LDLDIRECT in the last 72 hours. Thyroid Function Tests: No results for input(s): TSH, T4TOTAL, FREET4, T3FREE, THYROIDAB in the last 72 hours. Anemia Panel:  Recent Labs  06/29/17 0040  VITAMINB12 335  FOLATE 25.9  FERRITIN 32  TIBC 398  IRON 5*  RETICCTPCT 1.7   Sepsis Labs:  Recent Labs Lab 06/29/17 1710 06/29/17 2112  PROCALCITON 8.98  --   LATICACIDVEN 2.2* 1.2    Recent Results (from the past 240 hour(s))  MRSA PCR Screening     Status: None   Collection Time: 06/29/17 12:11 PM  Result  Value Ref Range Status   MRSA by PCR NEGATIVE NEGATIVE Final    Comment:        The GeneXpert MRSA Assay (FDA approved for NASAL specimens only), is one component of a comprehensive MRSA colonization surveillance program. It is not intended to diagnose MRSA infection nor to guide or monitor treatment for MRSA infections.          Radiology Studies: Ct Head Wo Contrast  Result Date: 06/29/2017 CLINICAL  DATA:  71 year old female with dizziness and fatigue. History of diabetes. EXAM: CT HEAD WITHOUT CONTRAST TECHNIQUE: Contiguous axial images were obtained from the base of the skull through the vertex without intravenous contrast. COMPARISON:  None. FINDINGS: Brain: There is mild age-related atrophy and chronic microvascular ischemic changes. No acute intracranial hemorrhage. No mass effect or midline shift. No extra-axial fluid collection. Vascular: No hyperdense vessel or unexpected calcification. Skull: Normal. Negative for fracture or focal lesion. Sinuses/Orbits: No acute finding. Bilateral exophthalmos. Clinical correlation is recommended. There is mild thickened appearance of the ocular muscles which may represent thyroid opthalmopathy. Clinical correlation is recommended. Other: None IMPRESSION: 1. No acute intracranial pathology. 2. Mild age-related atrophy and chronic microvascular ischemic changes. 3. Bilateral exophthalmos may be related to underlying thyroid disease. Electronically Signed   By: Anner Crete M.D.   On: 06/29/2017 05:48   US Abdomen Complete  Result Date: 06/29/2017 CLINICAL DATA:  Initial evaluation for acute dizziness. EXAM: ABDOMEN ULTRASOUND COMPLETE COMPARISON:  Prior radiograph from earlier same day. FINDINGS: Gallbladder: No gallstones or wall thickening visualized. A positive sonographic Murphy sign was elicited on exam. Common bile duct: Diameter: 2.2 mm Liver: No focal lesion identified. Within normal limits in parenchymal echogenicity. IVC: No  abnormality visualized. Pancreas: Visualized portion unremarkable. Spleen: Size and appearance within normal limits. Right Kidney: Length: 12.9 cm. Echogenicity within normal limits. No mass or hydronephrosis visualized. Left Kidney: Length: 10.9 cm. Echogenicity within normal limits. No mass or hydronephrosis visualized. Abdominal aorta: No aneurysm visualized. Other findings: None. IMPRESSION: 1. Positive sonographic Murphy's sign without cholelithiasis or other sonographic features to suggest acute cholecystitis. No biliary dilatation. 2. Otherwise unremarkable abdominal ultrasound. Electronically Signed   By: Jeannine Boga M.D.   On: 06/29/2017 06:50   Dg Abd Acute W/chest  Result Date: 06/29/2017 CLINICAL DATA:  71 year old female with weakness and mid abdominal pain. EXAM: DG ABDOMEN ACUTE W/ 1V CHEST COMPARISON:  Chest radiograph dated 08/02/2007 FINDINGS: Mild diffuse interstitial coarsening primarily involving the lung bases. No focal consolidation, pleural effusion, or pneumothorax. The cardiac silhouette is within normal limits. There degenerative changes of the shoulders. No acute osseous pathology. No bowel dilatation or evidence of obstruction. No free air or radiopaque calculi. The osseous structures and soft tissues appear unremarkable. IMPRESSION: Negative abdominal radiographs.  No acute cardiopulmonary disease. Electronically Signed   By: Anner Crete M.D.   On: 06/29/2017 01:51        Scheduled Meds: . insulin aspart  0-9 Units Subcutaneous TID WC   Continuous Infusions: . azithromycin Stopped (06/30/17 0249)  . cefTRIAXone (ROCEPHIN)  IV Stopped (06/30/17 0150)  . sodium chloride       LOS: 1 day    Time spent: 35 minutes.     Elmarie Shiley, MD [facility] Pager [phone]  If 7PM-7AM, please contact night-coverage [email or web address] Password [email or web address] 06/30/2017, 7:40 AM

## 2017-06-30 NOTE — Progress Notes (Signed)
Inpatient Diabetes Program Recommendations  AACE/ADA: New Consensus Statement on Inpatient Glycemic Control (2015)  Target Ranges:  Prepandial:   less than 140 mg/dL      Peak postprandial:   less than 180 mg/dL (1-2 hours)      Critically ill patients:  140 - 180 mg/dL   Results for KENNIDEE, HEYNE (MRN [id number]) as of 06/30/2017 13:45  Ref. Range 06/29/2017 12:03 06/29/2017 15:34 06/29/2017 20:33 06/30/2017 07:53 06/30/2017 12:18  Glucose-Capillary Latest Ref Range: 65 - 99 mg/dL 265 (H) 246 (H) 185 (H) 248 (H) 249 (H)    Review of Glycemic Control  Diabetes history: DM2 Outpatient Diabetes medications: Metformin 1000 mg BID Current orders for Inpatient glycemic control: Novolog 0-9 units TID with meals  Inpatient Diabetes Program Recommendations: Insulin - Basal: Please consider ordeirng Lantus 10 units Q24H (based on 95 kg x 0.1 units) Correction (SSI): Please consider adding Novolog 0-5 units QHS for bedtime correction scale. HgbA1C: Please consider ordering an A1C to evaluate glycemic control over the past 2-3 months.  Thanks, Barnie Alderman, RN, MSN, CDE Diabetes Coordinator Inpatient Diabetes Program [phone] (Team Pager from 8am to 5pm)

## 2017-06-30 NOTE — Progress Notes (Signed)
While wiping after urinating, patient reported a "small amount" of bright red blood "spotting" on the toilet paper from the vaginal area. Pt reports this is a new finding and denies any vaginal or menstrual blood for 10 years. When patient wiped a second time no blood was present for this RN to observe. Instructed patient to alert the RN if this occurs again. Will continue to monitor. S.Kerstin Crusoe, RN

## 2017-07-01 ENCOUNTER — Inpatient Hospital Stay (HOSPITAL_COMMUNITY): Payer: Medicare (Managed Care)

## 2017-07-01 DIAGNOSIS — D508 Other iron deficiency anemias: Secondary | ICD-10-CM

## 2017-07-01 DIAGNOSIS — D649 Anemia, unspecified: Secondary | ICD-10-CM

## 2017-07-01 DIAGNOSIS — E1165 Type 2 diabetes mellitus with hyperglycemia: Secondary | ICD-10-CM

## 2017-07-01 LAB — CBC
HCT: 25.1 % — ABNORMAL LOW (ref 36.0–46.0)
HCT: 25.1 % — ABNORMAL LOW (ref 36.0–46.0)
Hemoglobin: 7.9 g/dL — ABNORMAL LOW (ref 12.0–15.0)
Hemoglobin: 8 g/dL — ABNORMAL LOW (ref 12.0–15.0)
MCH: 21.6 pg — AB (ref 26.0–34.0)
MCH: 21.5 pg — AB (ref 26.0–34.0)
MCHC: 31.5 g/dL (ref 30.0–36.0)
MCHC: 31.9 g/dL (ref 30.0–36.0)
MCV: 68.8 fL — ABNORMAL LOW (ref 78.0–100.0)
MCV: 67.5 fL — ABNORMAL LOW (ref 78.0–100.0)
PLATELETS: 328 10*3/uL (ref 150–400)
Platelets: 268 10*3/uL (ref 150–400)
RBC: 3.65 MIL/uL — ABNORMAL LOW (ref 3.87–5.11)
RBC: 3.72 MIL/uL — AB (ref 3.87–5.11)
RDW: 22.8 % — AB (ref 11.5–15.5)
RDW: 23.8 % — ABNORMAL HIGH (ref 11.5–15.5)
WBC: 21.7 10*3/uL — ABNORMAL HIGH (ref 4.0–10.5)
WBC: 20.9 10*3/uL — AB (ref 4.0–10.5)

## 2017-07-01 LAB — BASIC METABOLIC PANEL
Anion gap: 9 (ref 5–15)
BUN: 12 mg/dL (ref 6–20)
CALCIUM: 8.8 mg/dL — AB (ref 8.9–10.3)
CO2: 25 mmol/L (ref 22–32)
CREATININE: 1.04 mg/dL — AB (ref 0.44–1.00)
Chloride: 104 mmol/L (ref 101–111)
GFR, EST NON AFRICAN AMERICAN: 54 mL/min — AB (ref >60)
Glucose, Bld: 232 mg/dL — ABNORMAL HIGH (ref 65–99)
Potassium: 3.5 mmol/L (ref 3.5–5.1)
Sodium: 138 mmol/L (ref 135–145)

## 2017-07-01 LAB — GLUCOSE, CAPILLARY
GLUCOSE-CAPILLARY: 195 mg/dL — AB (ref 65–99)
Glucose-Capillary: 213 mg/dL — ABNORMAL HIGH (ref 65–99)
Glucose-Capillary: 170 mg/dL — ABNORMAL HIGH (ref 65–99)
Glucose-Capillary: 243 mg/dL — ABNORMAL HIGH (ref 65–99)

## 2017-07-01 MED ORDER — INSULIN ASPART 100 UNIT/ML ~~LOC~~ SOLN
0.0000 [IU] | Freq: Every day | SUBCUTANEOUS | Status: DC
  Administered 2017-07-01: 2 [IU] via SUBCUTANEOUS
  Administered 2017-07-04: 0 [IU] via SUBCUTANEOUS

## 2017-07-01 MED ORDER — INSULIN ASPART 100 UNIT/ML ~~LOC~~ SOLN: 0.0000 [IU] | Freq: Three times a day (TID) | SUBCUTANEOUS | Status: DC

## 2017-07-01 MED ORDER — FA-PYRIDOXINE-CYANOCOBALAMIN 2.5-25-2 MG PO TABS
1.0000 | ORAL_TABLET | Freq: Every day | ORAL | Status: DC
  Administered 2017-07-01: 1 via ORAL
  Filled 2017-07-01: qty 1

## 2017-07-01 MED ORDER — INSULIN ASPART 100 UNIT/ML ~~LOC~~ SOLN
0.0000 [IU] | Freq: Three times a day (TID) | SUBCUTANEOUS | Status: DC
  Administered 2017-07-01: 3 [IU] via SUBCUTANEOUS
  Administered 2017-07-01: 5 [IU] via SUBCUTANEOUS
  Administered 2017-07-02: 2 [IU] via SUBCUTANEOUS
  Administered 2017-07-02: 15 [IU] via SUBCUTANEOUS
  Administered 2017-07-03: 2 [IU] via SUBCUTANEOUS
  Administered 2017-07-03: 3 [IU] via SUBCUTANEOUS
  Administered 2017-07-03 – 2017-07-04 (×2): 5 [IU] via SUBCUTANEOUS

## 2017-07-01 MED ORDER — ZOLPIDEM TARTRATE 5 MG PO TABS
5.0000 mg | ORAL_TABLET | Freq: Every evening | ORAL | Status: DC | PRN
  Administered 2017-07-01: 5 mg via ORAL
  Filled 2017-07-01: qty 1

## 2017-07-01 MED ORDER — INSULIN GLARGINE 100 UNIT/ML ~~LOC~~ SOLN
10.0000 [IU] | Freq: Every day | SUBCUTANEOUS | Status: DC
  Administered 2017-07-01 – 2017-07-04 (×4): 10 [IU] via SUBCUTANEOUS
  Filled 2017-07-01 (×4): qty 0.1

## 2017-07-01 MED ORDER — VITAMIN B-12 1000 MCG PO TABS
1000.0000 ug | ORAL_TABLET | Freq: Every day | ORAL | Status: DC
  Administered 2017-07-01 – 2017-07-04 (×4): 1000 ug via ORAL
  Filled 2017-07-01 (×4): qty 1

## 2017-07-01 NOTE — Progress Notes (Signed)
Pt. placed on BIPAP for h/s, humidity filled, placed 2 lpm oxygen into circuit, pt. tolerating well, RT to monitor.

## 2017-07-01 NOTE — Progress Notes (Signed)
Inpatient Diabetes Program Recommendations  AACE/ADA: New Consensus Statement on Inpatient Glycemic Control (2015)  Target Ranges:  Prepandial:   less than 140 mg/dL      Peak postprandial:   less than 180 mg/dL (1-2 hours)      Critically ill patients:  140 - 180 mg/dL   Results for [name] [name], [name] [name] (MRN [id number]) as of 07/01/2017 08:17  Ref. Range 06/30/2017 07:53 06/30/2017 12:18 06/30/2017 16:04 06/30/2017 22:02  Glucose-Capillary Latest Ref Range: 65 - 99 mg/dL 248 (H) 249 (H) 217 (H) 206 (H)   Results for [name] [name], [name] [name] (MRN [id number]) as of 07/01/2017 08:17  Ref. Range 07/01/2017 07:49  Glucose-Capillary Latest Ref Range: 65 - 99 mg/dL 195 (H)    Outpatient DM meds: Metformin 1000 mg BID  Current Insulin Orders: Novolog 0-9 units TID with meals      MD- Please consider the following in-hospital insulin adjustments:  1. Start Lantus 10 units Q24H (based on 98 kg x 0.1 units)  2. Add Novolog 0-5 units QHS for bedtime correction scale (SSI)  3. Order an A1C to evaluate glycemic control over the past 2-3 months     --Will follow patient during hospitalization--  Wyn Quaker RN, MSN, CDE Diabetes Coordinator Inpatient Glycemic Control Team Team Pager: [phone] (8a-5p)

## 2017-07-01 NOTE — Progress Notes (Signed)
Pt. seen for CPAP for h/s, wants to wait for little bit yet, told to notify when ready, RT to monitor.

## 2017-07-01 NOTE — Progress Notes (Signed)
RT placed patient on CPAP. Patient setting is auto 8-20 cmH2O.Water added to water chamber for humidification. 2 Liters oxygen bleed into tubing.Patient is tolerating well.

## 2017-07-01 NOTE — Progress Notes (Signed)
PROGRESS NOTE                                                                                                                                                                                                             Patient Demographics:    [name] [name], is a 71 y.o. female, DOB - 1946/12/13, AST:[id number]  Admit date - 06/29/2017   Admitting Physician Elmarie Shiley, MD  Outpatient Primary MD for the patient is System, Pcp Not In  LOS - 2  Outpatient Specialists:None  Chief Complaint  Patient presents with  . Nausea  . Emesis       Brief Narrative   71 year old female with history of uncontrolled type 2 diabetes mellitus who is visiting her daughter in [place] presented with Marlou Starks 3 days of dizziness, nausea and poor appetite with generalized weakness. She also reported subjective fever with chills. Denied any dysuria, hematuria or diarrhea. Denied any chest pain reported having persistent productive cough. Patient reported being started on Lasix one month back in [place] for lower extremity edema. Also reported having a cardiac cath without stent placed many years ago.  In the ED she was septic with fever, tachycardia and leukocytosis with WBC of 23 K.. She was also hypotensive and received IV fluid bolus. Had elevated lactic acid of 2.2 along with acute kidney injury with creatinine of 2.. UA was positive for UTI. Her hemoglobin was found to be 5 and transfuse with 4 unit PRBC.   Subjective:   Last evening while wiping after urinating she reported small moderate bright red blood on the toilet paper which is new for her. Reported her dizziness to be better after blood transfusion. Cough has improved. He reports being unable to sleep last night.   Assessment  & Plan :    Principal Problem:   Sepsis With septic shock ([place]) Resolving. Secondary to UTI with bacteremia and severe symptomatic anemia.  Blood culture  on admission growing Enterobacter Escherichia coli (likely urinary source), sensitivity pending. Urine culture with mixed growth. -Antibiotic narrowed to IV Rocephin.   Active Problems: Severe iron deficiency anemia Stool for Hemoccult was negative. H&H improved with 4 unit PRBC. Has low normal B12 and will replenish. No prior EGD or colonoscopy done. Denies use of NSAIDs. Check renal and pelvic ultrasound to rule out any bladder or uterine  mass. CT of the abdomen and pelvis show some perinephric stranding in both kidneys. -Patient plans to stay back in [place] with her daughter. I will arrange outpatient follow-up with GI upon discharge.  UTI As outlined above. On empiric Rocephin. Follow blood culture sensitivity.    Diabetes mellitus type 2 in obese (HCC) Only on metformin. Check A1c. Switch to moderate sliding scale coverage and add lantus 10 u daily.     ARF (acute renal failure) (HCC) Due to septic shock. Now resolved  ? acute bronchitis On  ceftriaxone. Cough resolved.    Code Status : full code  Family Communication  : Daughter at bedside  Disposition Plan  : Home once improved, pending blood culture sensitivity results. possibly in the next 24-48 hours. Stable for transfer to telemetry.  Barriers For Discharge : Active symptoms  Consults  : None  Procedures  : CT abdomen and pelvis  DVT Prophylaxis  :  SCDs  Lab Results  Component Value Date   PLT 328 07/01/2017    Antibiotics  :    Anti-infectives    Start     Dose/Rate Route Frequency Ordered Stop   06/30/17 2200  cefTRIAXone (ROCEPHIN) 2 g in dextrose 5 % 50 mL IVPB     2 g 100 mL/hr over 30 Minutes Intravenous Every 24 hours 06/30/17 2118     06/29/17 2300  cefTRIAXone (ROCEPHIN) 1 g in dextrose 5 % 50 mL IVPB  Status:  Discontinued     1 g 100 mL/hr over 30 Minutes Intravenous Every 24 hours 06/29/17 0902 06/30/17 2117   06/29/17 2300  azithromycin (ZITHROMAX) 500 mg in dextrose 5 % 250 mL IVPB   Status:  Discontinued     500 mg 250 mL/hr over 60 Minutes Intravenous Every 24 hours 06/29/17 0902 06/30/17 2132   06/29/17 0945  piperacillin-tazobactam (ZOSYN) IVPB 3.375 g  Status:  Discontinued     3.375 g 100 mL/hr over 30 Minutes Intravenous  Once 06/29/17 0933 06/29/17 1012   06/29/17 0945  vancomycin (VANCOCIN) IVPB 1000 mg/200 mL premix  Status:  Discontinued     1,000 mg 200 mL/hr over 60 Minutes Intravenous  Once 06/29/17 0933 06/29/17 1012   06/29/17 0200  cefTRIAXone (ROCEPHIN) 1 g in dextrose 5 % 50 mL IVPB     1 g 100 mL/hr over 30 Minutes Intravenous  Once 06/29/17 0159 06/29/17 0258   06/29/17 0200  azithromycin (ZITHROMAX) 500 mg in dextrose 5 % 250 mL IVPB     500 mg 250 mL/hr over 60 Minutes Intravenous  Once 06/29/17 0159 06/29/17 0450        Objective:   Vitals:   07/01/17 0800 07/01/17 0900 07/01/17 1000 07/01/17 1159  BP: 115/61     Pulse: 84 81 79   Resp: 20 16 (!) 21   Temp: 98.3 F (36.8 C)   98.5 F (36.9 C)  TempSrc: Oral   Oral  SpO2: 98% 99% 100%   Weight:      Height:        Wt Readings from Last 3 Encounters:  07/01/17 98.2 kg (216 lb 7.9 oz)     Intake/Output Summary (Last 24 hours) at 07/01/17 1208 Last data filed at 07/01/17 0900  Gross per 24 hour  Intake          1701.67 ml  Output             2950 ml  Net         -  1248.33 ml     Physical Exam  Gen: not in distress HEENT: pallor+,  moist mucosa, supple neck Chest: clear b/l, no added sounds CVS: N S1&S2, no murmurs GI: soft, NT, ND,  Musculoskeletal: warm, no edema     Data Review:    CBC  Recent Labs Lab 06/29/17 0040 06/29/17 1710 06/30/17 0359 06/30/17 0908 07/01/17 0958  WBC 23.6*  --  21.7* 21.9* 20.9*  HGB 5.1* 6.1* 7.9* 8.4* 8.0*  HCT 17.5* 20.3* 25.1* 26.1* 25.1*  PLT 348  --  268 294 328  MCV 60.6*  --  68.8* 67.4* 67.5*  MCH 17.6*  --  21.6* 21.7* 21.5*  MCHC 29.1*  --  31.5 32.2 31.9  RDW 18.2*  --  22.8* 22.8* 23.8*  LYMPHSABS 0.9  --    --   --   --   MONOABS 2.6*  --   --   --   --   EOSABS 0.0  --   --   --   --   BASOSABS 0.0  --   --   --   --     Chemistries   Recent Labs Lab 06/29/17 0040 06/29/17 1710 06/30/17 0359 07/01/17 0958  NA 126*  --  134* 138  K 4.2  --  4.1 3.5  CL 94*  --  102 104  CO2 22  --  22 25  GLUCOSE 329*  --  260* 232*  BUN 21*  --  16 12  CREATININE 2.05*  --  1.34* 1.04*  CALCIUM 8.2*  --  8.2* 8.8*  AST  --  34  --   --   ALT  --  20  --   --   ALKPHOS  --  58  --   --   BILITOT  --  0.7  --   --    ------------------------------------------------------------------------------------------------------------------ No results for input(s): CHOL, HDL, LDLCALC, TRIG, CHOLHDL, LDLDIRECT in the last 72 hours.  Lab Results  Component Value Date   HGBA1C 8.7 (H) 12/28/2009   ------------------------------------------------------------------------------------------------------------------ No results for input(s): TSH, T4TOTAL, T3FREE, THYROIDAB in the last 72 hours.  Invalid input(s): FREET3 ------------------------------------------------------------------------------------------------------------------  Recent Labs  06/29/17 0040  VITAMINB12 335  FOLATE 25.9  FERRITIN 32  TIBC 398  IRON 5*  RETICCTPCT 1.7    Coagulation profile  Recent Labs Lab 06/29/17 1710  INR 1.22    No results for input(s): DDIMER in the last 72 hours.  Cardiac Enzymes No results for input(s): CKMB, TROPONINI, MYOGLOBIN in the last 168 hours.  Invalid input(s): CK ------------------------------------------------------------------------------------------------------------------ No results found for: BNP  Inpatient Medications  Scheduled Meds: . folic acid-pyridoxine-cyancobalamin  1 tablet Oral Daily  . insulin aspart  0-9 Units Subcutaneous TID WC   Continuous Infusions: . cefTRIAXone (ROCEPHIN)  IV Stopped (06/30/17 2205)  . sodium chloride     PRN Meds:.acetaminophen **OR**  acetaminophen, ipratropium-albuterol, ondansetron **OR** ondansetron (ZOFRAN) IV  Micro Results Recent Results (from the past 240 hour(s))  Culture, blood (single)     Status: Abnormal (Preliminary result)   Collection Time: 06/29/17 12:38 AM  Result Value Ref Range Status   Specimen Description BLOOD RIGHT ANTECUBITAL  Final   Special Requests   Final    BOTTLES DRAWN AEROBIC AND ANAEROBIC Blood Culture adequate volume   Culture  Setup Time   Final    GRAM NEGATIVE RODS ANAEROBIC BOTTLE ONLY CRITICAL RESULT CALLED TO, READ BACK BY AND VERIFIED WITH: N GLOGOVAC PHARMD 2100  06/30/17 A BROWNING    Culture (A)  Final    ESCHERICHIA COLI SUSCEPTIBILITIES TO FOLLOW Performed at [facility], [place] [address] [place], [place] [address]    Report Status PENDING  Incomplete  Blood Culture ID Panel (Reflexed)     Status: Abnormal   Collection Time: 06/29/17 12:38 AM  Result Value Ref Range Status   Enterococcus species NOT DETECTED NOT DETECTED Final   Listeria monocytogenes NOT DETECTED NOT DETECTED Final   Staphylococcus species NOT DETECTED NOT DETECTED Final   Staphylococcus aureus NOT DETECTED NOT DETECTED Final   Streptococcus species NOT DETECTED NOT DETECTED Final   Streptococcus agalactiae NOT DETECTED NOT DETECTED Final   Streptococcus pneumoniae NOT DETECTED NOT DETECTED Final   Streptococcus pyogenes NOT DETECTED NOT DETECTED Final   Acinetobacter baumannii NOT DETECTED NOT DETECTED Final   Enterobacteriaceae species DETECTED (A) NOT DETECTED Final    Comment: Enterobacteriaceae represent a large family of gram-negative bacteria, not a single organism. CRITICAL RESULT CALLED TO, READ BACK BY AND VERIFIED WITH: N GLOGOVAC PHARMD 2100 06/30/17 A BROWNING    Enterobacter cloacae complex NOT DETECTED NOT DETECTED Final   Escherichia coli DETECTED (A) NOT DETECTED Final    Comment: CRITICAL RESULT CALLED TO, READ BACK BY AND VERIFIED WITH: N GLOGOVAC PHARMD 2100 06/30/17 A  BROWNING    Klebsiella oxytoca NOT DETECTED NOT DETECTED Final   Klebsiella pneumoniae NOT DETECTED NOT DETECTED Final   Proteus species NOT DETECTED NOT DETECTED Final   Serratia marcescens NOT DETECTED NOT DETECTED Final   Carbapenem resistance NOT DETECTED NOT DETECTED Final   Haemophilus influenzae NOT DETECTED NOT DETECTED Final   Neisseria meningitidis NOT DETECTED NOT DETECTED Final   Pseudomonas aeruginosa NOT DETECTED NOT DETECTED Final   Candida albicans NOT DETECTED NOT DETECTED Final   Candida glabrata NOT DETECTED NOT DETECTED Final   Candida krusei NOT DETECTED NOT DETECTED Final   Candida parapsilosis NOT DETECTED NOT DETECTED Final   Candida tropicalis NOT DETECTED NOT DETECTED Final    Comment: Performed at [facility], [place] [address] [place], [place] [address]  Blood culture (routine x 2)     Status: None (Preliminary result)   Collection Time: 06/29/17  2:25 AM  Result Value Ref Range Status   Specimen Description BLOOD LEFT ANTECUBITAL  Final   Special Requests   Final    BOTTLES DRAWN AEROBIC AND ANAEROBIC Blood Culture adequate volume   Culture   Final    NO GROWTH 2 DAYS Performed at [facility], [place] [address], [place], [place] [address]    Report Status PENDING  Incomplete  Urine Culture     Status: Abnormal   Collection Time: 06/29/17  2:58 AM  Result Value Ref Range Status   Specimen Description URINE, CLEAN CATCH  Final   Special Requests NONE  Final   Culture MULTIPLE ORGANISMS PRESENT, NONE PREDOMINANT (A)  Final   Report Status 06/30/2017 FINAL  Final  Blood culture (routine x 2)     Status: None (Preliminary result)   Collection Time: 06/29/17  7:30 AM  Result Value Ref Range Status   Specimen Description BLOOD BLOOD RIGHT HAND  Final   Special Requests   Final    BOTTLES DRAWN AEROBIC AND ANAEROBIC Blood Culture adequate volume   Culture   Final    NO GROWTH 2 DAYS Performed at [facility], [place] [address]  [place], [place] [address]    Report  Status PENDING  Incomplete  MRSA PCR Screening     Status: None   Collection Time: 06/29/17 12:11 PM  Result Value Ref Range Status   MRSA by PCR NEGATIVE NEGATIVE Final    Comment:        The GeneXpert MRSA Assay (FDA approved for NASAL specimens only), is one component of a comprehensive MRSA colonization surveillance program. It is not intended to diagnose MRSA infection nor to guide or monitor treatment for MRSA infections.     Radiology Reports Ct Abdomen Pelvis Wo Contrast  Result Date: 06/30/2017 CLINICAL DATA:  Anemia.  Vomiting.  Renal failure. EXAM: CT ABDOMEN AND PELVIS WITHOUT CONTRAST TECHNIQUE: Multidetector CT imaging of the abdomen and pelvis was performed following the standard protocol without IV contrast. COMPARISON:  Right upper quadrant ultrasound yesterday. FINDINGS: Lower chest: Small bilateral pleural effusions, right greater than left. Associated compressive atelectasis in the right lower lobe. Hepatobiliary: Mild decreased hepatic density suggesting steatosis. No evidence of focal lesion allowing for lack contrast. Gallbladder is decompressed. No calcified gallstone. No biliary dilatation. Pancreas: No pancreatic ductal dilatation or surrounding inflammatory changes. Lack of IV contrast limits detailed assessment. Spleen: Normal in size without focal abnormality. Small splenule inferiorly. Adrenals/Urinary Tract: No adrenal nodule. Mild perinephric edema about both kidneys. Fullness of both renal collecting systems without frank hydronephrosis. Ureters are not well-defined. No urolithiasis. Urinary bladder is physiologically distended. No definite bladder wall thickening. Stomach/Bowel: Stomach physiologically distended with ingested contents. No bowel obstruction with enteric contrast throughout the colon. Appendix filled without contrast and normal. There is no bowel dilatation, evidence of wall thickening or inflammation. Occasional  colonic diverticula in the sigmoid colon. No diverticulitis. Vascular/Lymphatic: Mild aortic atherosclerosis without aneurysm. Probable small retroperitoneal lymph nodes, for example left periaortic node measures 8 mm at the level of the knees renal vein. Retroperitoneal structures not well-defined without IV contrast. No evidence pelvic adenopathy. Reproductive: Uterus and bilateral adnexa are unremarkable. Other: Laxity of the anterior abdominal wall left small fat containing umbilical hernia. No free air, free fluid, or intra-abdominal fluid collection. Musculoskeletal: Degenerative change in the lumbar spine and the pubic symphysis. Mild degenerative change of both hips. There are no acute or suspicious osseous abnormalities. IMPRESSION: 1. Mild perinephric stranding about both kidneys, nonspecific, can be seen with urinary tract infection or chronic renal disease. 2. Trace pleural effusions, right greater than left. 3. Mild hepatic steatosis.  Mild aortic atherosclerosis. 4. Occasional colonic diverticulosis without diverticulitis. Electronically Signed   By: Jeb Levering M.D.   On: 06/30/2017 16:24   Dg Chest 2 View  Result Date: 06/30/2017 CLINICAL DATA:  71 year old female cough. Prior smoker. Subsequent encounter. EXAM: CHEST  2 VIEW COMPARISON:  06/29/2017 and 08/02/2007. FINDINGS: Small right-sided pleural effusion. Mild pulmonary vascular prominence. No segmental consolidation or pneumothorax. No plain film evidence of pulmonary malignancy. Calcified slightly tortuous aorta. Heart size within normal limits. Shoulder joint degenerative changes. Mild degenerative changes thoracic spine without compression fracture. IMPRESSION: Small right-sided pleural effusion. Mild pulmonary vascular prominence. Aortic atherosclerosis. Electronically Signed   By: Genia Del M.D.   On: 06/30/2017 08:45   Ct Head Wo Contrast  Result Date: 06/29/2017 CLINICAL DATA:  71 year old female with dizziness and  fatigue. History of diabetes. EXAM: CT HEAD WITHOUT CONTRAST TECHNIQUE: Contiguous axial images were obtained from the base of the skull through the vertex without intravenous contrast. COMPARISON:  None. FINDINGS: Brain: There is mild age-related atrophy and chronic microvascular ischemic changes. No acute intracranial hemorrhage. No mass  effect or midline shift. No extra-axial fluid collection. Vascular: No hyperdense vessel or unexpected calcification. Skull: Normal. Negative for fracture or focal lesion. Sinuses/Orbits: No acute finding. Bilateral exophthalmos. Clinical correlation is recommended. There is mild thickened appearance of the ocular muscles which may represent thyroid opthalmopathy. Clinical correlation is recommended. Other: None IMPRESSION: 1. No acute intracranial pathology. 2. Mild age-related atrophy and chronic microvascular ischemic changes. 3. Bilateral exophthalmos may be related to underlying thyroid disease. Electronically Signed   By: Anner Crete M.D.   On: 06/29/2017 05:48   US Abdomen Complete  Result Date: 06/29/2017 CLINICAL DATA:  Initial evaluation for acute dizziness. EXAM: ABDOMEN ULTRASOUND COMPLETE COMPARISON:  Prior radiograph from earlier same day. FINDINGS: Gallbladder: No gallstones or wall thickening visualized. A positive sonographic Murphy sign was elicited on exam. Common bile duct: Diameter: 2.2 mm Liver: No focal lesion identified. Within normal limits in parenchymal echogenicity. IVC: No abnormality visualized. Pancreas: Visualized portion unremarkable. Spleen: Size and appearance within normal limits. Right Kidney: Length: 12.9 cm. Echogenicity within normal limits. No mass or hydronephrosis visualized. Left Kidney: Length: 10.9 cm. Echogenicity within normal limits. No mass or hydronephrosis visualized. Abdominal aorta: No aneurysm visualized. Other findings: None. IMPRESSION: 1. Positive sonographic Murphy's sign without cholelithiasis or other sonographic  features to suggest acute cholecystitis. No biliary dilatation. 2. Otherwise unremarkable abdominal ultrasound. Electronically Signed   By: Jeannine Boga M.D.   On: 06/29/2017 06:50   Dg Abd Acute W/chest  Result Date: 06/29/2017 CLINICAL DATA:  71 year old female with weakness and mid abdominal pain. EXAM: DG ABDOMEN ACUTE W/ 1V CHEST COMPARISON:  Chest radiograph dated 08/02/2007 FINDINGS: Mild diffuse interstitial coarsening primarily involving the lung bases. No focal consolidation, pleural effusion, or pneumothorax. The cardiac silhouette is within normal limits. There degenerative changes of the shoulders. No acute osseous pathology. No bowel dilatation or evidence of obstruction. No free air or radiopaque calculi. The osseous structures and soft tissues appear unremarkable. IMPRESSION: Negative abdominal radiographs.  No acute cardiopulmonary disease. Electronically Signed   By: Anner Crete M.D.   On: 06/29/2017 01:51    Time Spent in minutes  35   Louellen Molder M.D on 07/01/2017 at 12:08 PM  Between 7am to 7pm - Pager - [phone]  After 7pm go to [email or web address] - password [facility]  [facility] -  Office  [phone]

## 2017-07-02 ENCOUNTER — Encounter: Payer: Self-pay | Admitting: Physician Assistant

## 2017-07-02 DIAGNOSIS — N800 Endometriosis of uterus: Secondary | ICD-10-CM

## 2017-07-02 LAB — GLUCOSE, CAPILLARY
Glucose-Capillary: 200 mg/dL — ABNORMAL HIGH (ref 65–99)
Glucose-Capillary: 96 mg/dL (ref 65–99)
Glucose-Capillary: 131 mg/dL — ABNORMAL HIGH (ref 65–99)
Glucose-Capillary: 165 mg/dL — ABNORMAL HIGH (ref 65–99)

## 2017-07-02 LAB — CBC
HEMATOCRIT: 24 % — AB (ref 36.0–46.0)
HEMOGLOBIN: 7.5 g/dL — AB (ref 12.0–15.0)
MCH: 21.6 pg — AB (ref 26.0–34.0)
MCHC: 31.3 g/dL (ref 30.0–36.0)
MCV: 69.2 fL — AB (ref 78.0–100.0)
Platelets: 329 10*3/uL (ref 150–400)
RBC: 3.47 MIL/uL — AB (ref 3.87–5.11)
RDW: 24.8 % — ABNORMAL HIGH (ref 11.5–15.5)
WBC: 18.8 10*3/uL — ABNORMAL HIGH (ref 4.0–10.5)

## 2017-07-02 LAB — HEMOGLOBIN A1C
HEMOGLOBIN A1C: 6.8 % — AB (ref 4.8–5.6)
MEAN PLASMA GLUCOSE: 148 mg/dL

## 2017-07-02 MED ORDER — SODIUM CHLORIDE 0.9 % IV SOLN
510.0000 mg | Freq: Once | INTRAVENOUS | Status: AC
  Administered 2017-07-02: 510 mg via INTRAVENOUS
  Filled 2017-07-02 (×2): qty 17

## 2017-07-02 MED ORDER — INSULIN ASPART 100 UNIT/ML ~~LOC~~ SOLN
3.0000 [IU] | Freq: Three times a day (TID) | SUBCUTANEOUS | Status: DC
  Administered 2017-07-02 – 2017-07-04 (×6): 3 [IU] via SUBCUTANEOUS

## 2017-07-02 NOTE — Progress Notes (Signed)
PROGRESS NOTE                                                                                                                                                                                                             Patient Demographics:    [name] [name], is a 71 y.o. female, DOB - 1946-01-09, JYN:[id number]  Admit date - 06/29/2017   Admitting Physician Elmarie Shiley, MD  Outpatient Primary MD for the patient is System, Pcp Not In  LOS - 3  Outpatient Specialists:None  Chief Complaint  Patient presents with  . Nausea  . Emesis       Brief Narrative   71 year old female with history of uncontrolled type 2 diabetes mellitus who is visiting her daughter in [place] presented with Marlou Starks 3 days of dizziness, nausea and poor appetite with generalized weakness. She also reported subjective fever with chills. Denied any dysuria, hematuria or diarrhea. Denied any chest pain reported having persistent productive cough. Patient reported being started on Lasix one month back in [place] for lower extremity edema. Also reported having a cardiac cath without stent placed many years ago.  In the ED she was septic with fever, tachycardia and leukocytosis with WBC of 23 K.. She was also hypotensive and received IV fluid bolus. Had elevated lactic acid of 2.2 along with acute kidney injury with creatinine of 2.. UA was positive for UTI. Her hemoglobin was found to be 5 and transfuse with 4 unit PRBC.   Subjective:   No further spotting from the vaginal area. Renal ultrasound unremarkable. Pelvic ultrasound shows heterogeneous texture throughout uterus suggestive of intramural fibroid (adenomyosis). Feels weak but better.   Assessment  & Plan :    Principal Problem:   Sepsis With septic shock ([place]) Resolved except for leukocytosis which is also improving. Secondary to UTI with bacteremia and severe symptomatic anemia.  Blood culture  positive for Enterobacter Escherichia coli (likely urinary source), sensitive to cephalosporins. Continue empiric Rocephin. Urine culture with mixed growth.    Active Problems: Severe iron deficiency anemia Stool for Hemoccult was negative. H&H improved with 4 unit PRBC. Has low normal B12 and added supplement. No prior EGD or colonoscopy done. Denies use of NSAIDs. Renal ultrasound negative for mass. Pelvic ultrasound showed diffuse heterogeneous texture throughout uterus suggestive of intramural fibroid (adenomyosis). No further basilar spotting.  Should benefit from outpatient referral to GYN. -Severe iron deficiency on iron  panel. Ordered a dose of IV feraheme. Consulted [facility] and has appointment on 7/20 at 1:15 PM.  UTI As outlined above. On empiric Rocephin. Culture sensitive to cephalosporins.    Diabetes mellitus type 2 in obese (HCC) Only on metformin. A1c of 6.8. On moderate sliding scale coverage and daily Lantus.     ARF (acute renal failure) (HCC) Due to septic shock. Now resolved  ? acute bronchitis On  ceftriaxone. Cough resolved.  Obstructive sleep apnea Continue nighttime CPAP    Code Status : full code  Family Communication  : None at bedside  Disposition Plan  : Home tomorrow if continues to improve and H&H stable. Patient plans to stay in [place] (will provide prescriptions and arrange appointment to establish care  Barriers For Discharge : Active symptoms  Consults  : None  Procedures  : CT abdomen and pelvis  DVT Prophylaxis  :  SCDs  Lab Results  Component Value Date   PLT 329 07/02/2017    Antibiotics  :    Anti-infectives    Start     Dose/Rate Route Frequency Ordered Stop   06/30/17 2200  cefTRIAXone (ROCEPHIN) 2 g in dextrose 5 % 50 mL IVPB     2 g 100 mL/hr over 30 Minutes Intravenous Every 24 hours 06/30/17 2118     06/29/17 2300  cefTRIAXone (ROCEPHIN) 1 g in dextrose 5 % 50 mL IVPB  Status:  Discontinued     1 g 100 mL/hr  over 30 Minutes Intravenous Every 24 hours 06/29/17 0902 06/30/17 2117   06/29/17 2300  azithromycin (ZITHROMAX) 500 mg in dextrose 5 % 250 mL IVPB  Status:  Discontinued     500 mg 250 mL/hr over 60 Minutes Intravenous Every 24 hours 06/29/17 0902 06/30/17 2132   06/29/17 0945  piperacillin-tazobactam (ZOSYN) IVPB 3.375 g  Status:  Discontinued     3.375 g 100 mL/hr over 30 Minutes Intravenous  Once 06/29/17 0933 06/29/17 1012   06/29/17 0945  vancomycin (VANCOCIN) IVPB 1000 mg/200 mL premix  Status:  Discontinued     1,000 mg 200 mL/hr over 60 Minutes Intravenous  Once 06/29/17 0933 06/29/17 1012   06/29/17 0200  cefTRIAXone (ROCEPHIN) 1 g in dextrose 5 % 50 mL IVPB     1 g 100 mL/hr over 30 Minutes Intravenous  Once 06/29/17 0159 06/29/17 0258   06/29/17 0200  azithromycin (ZITHROMAX) 500 mg in dextrose 5 % 250 mL IVPB     500 mg 250 mL/hr over 60 Minutes Intravenous  Once 06/29/17 0159 06/29/17 0450        Objective:   Vitals:   07/02/17 0600 07/02/17 0623 07/02/17 0737 07/02/17 1023  BP: (!) 138/57  (!) 138/57   Pulse: 87  78 76  Resp: (!) 29  16 17   Temp:   97.7 F (36.5 C) 97.8 F (36.6 C)  TempSrc:   Oral Oral  SpO2: 97%  98% 100%  Weight:  93.8 kg (206 lb 12.7 oz)    Height:        Wt Readings from Last 3 Encounters:  07/02/17 93.8 kg (206 lb 12.7 oz)     Intake/Output Summary (Last 24 hours) at 07/02/17 1406 Last data filed at 07/02/17 0900  Gross per 24 hour  Intake              290 ml  Output  2050 ml  Net            -1760 ml     Physical Exam Gen.: Elderly female not in distress HEENT: Pallor present, moist mucosa, supple neck CVS: Normal S1 and S2, no murmurs Chest: Clear to auscultation GI: Soft, nondistended, nontender Musculoskeletal: Warm, no edema      Data Review:    CBC  Recent Labs Lab 06/29/17 0040 06/29/17 1710 06/30/17 0359 06/30/17 0908 07/01/17 0958 07/02/17 0254  WBC 23.6*  --  21.7* 21.9* 20.9* 18.8*    HGB 5.1* 6.1* 7.9* 8.4* 8.0* 7.5*  HCT 17.5* 20.3* 25.1* 26.1* 25.1* 24.0*  PLT 348  --  268 294 328 329  MCV 60.6*  --  68.8* 67.4* 67.5* 69.2*  MCH 17.6*  --  21.6* 21.7* 21.5* 21.6*  MCHC 29.1*  --  31.5 32.2 31.9 31.3  RDW 18.2*  --  22.8* 22.8* 23.8* 24.8*  LYMPHSABS 0.9  --   --   --   --   --   MONOABS 2.6*  --   --   --   --   --   EOSABS 0.0  --   --   --   --   --   BASOSABS 0.0  --   --   --   --   --     Chemistries   Recent Labs Lab 06/29/17 0040 06/29/17 1710 06/30/17 0359 07/01/17 0958  NA 126*  --  134* 138  K 4.2  --  4.1 3.5  CL 94*  --  102 104  CO2 22  --  22 25  GLUCOSE 329*  --  260* 232*  BUN 21*  --  16 12  CREATININE 2.05*  --  1.34* 1.04*  CALCIUM 8.2*  --  8.2* 8.8*  AST  --  34  --   --   ALT  --  20  --   --   ALKPHOS  --  58  --   --   BILITOT  --  0.7  --   --    ------------------------------------------------------------------------------------------------------------------ No results for input(s): CHOL, HDL, LDLCALC, TRIG, CHOLHDL, LDLDIRECT in the last 72 hours.  Lab Results  Component Value Date   HGBA1C 6.8 (H) 07/01/2017   ------------------------------------------------------------------------------------------------------------------ No results for input(s): TSH, T4TOTAL, T3FREE, THYROIDAB in the last 72 hours.  Invalid input(s): FREET3 ------------------------------------------------------------------------------------------------------------------ No results for input(s): VITAMINB12, FOLATE, FERRITIN, TIBC, IRON, RETICCTPCT in the last 72 hours.  Coagulation profile  Recent Labs Lab 06/29/17 1710  INR 1.22    No results for input(s): DDIMER in the last 72 hours.  Cardiac Enzymes No results for input(s): CKMB, TROPONINI, MYOGLOBIN in the last 168 hours.  Invalid input(s): CK ------------------------------------------------------------------------------------------------------------------ No results found for:  BNP  Inpatient Medications  Scheduled Meds: . insulin aspart  0-15 Units Subcutaneous TID WC  . insulin aspart  0-5 Units Subcutaneous QHS  . insulin aspart  3 Units Subcutaneous TID WC  . insulin glargine  10 Units Subcutaneous Daily  . vitamin B-12  1,000 mcg Oral Daily   Continuous Infusions: . cefTRIAXone (ROCEPHIN)  IV Stopped (07/01/17 2145)  . sodium chloride     PRN Meds:.acetaminophen **OR** acetaminophen, ipratropium-albuterol, ondansetron **OR** ondansetron (ZOFRAN) IV, zolpidem  Micro Results Recent Results (from the past 240 hour(s))  Culture, blood (single)     Status: Abnormal (Preliminary result)   Collection Time: 06/29/17 12:38 AM  Result Value Ref Range Status   Specimen  Description BLOOD RIGHT ANTECUBITAL  Final   Special Requests   Final    BOTTLES DRAWN AEROBIC AND ANAEROBIC Blood Culture adequate volume   Culture  Setup Time   Final    GRAM NEGATIVE RODS ANAEROBIC BOTTLE ONLY CRITICAL RESULT CALLED TO, READ BACK BY AND VERIFIED WITHGuadlupe Spanish PHARMD 2100 06/30/17 A BROWNING Performed at [facility], [place] [address] [place], [place] [address]    Culture ESCHERICHIA COLI (A)  Final   Report Status PENDING  Incomplete   Organism ID, Bacteria ESCHERICHIA COLI  Final      Susceptibility   Escherichia coli - MIC*    AMPICILLIN >=32 RESISTANT Resistant     CEFAZOLIN <=4 SENSITIVE Sensitive     CEFEPIME <=1 SENSITIVE Sensitive     CEFTAZIDIME <=1 SENSITIVE Sensitive     CEFTRIAXONE <=1 SENSITIVE Sensitive     CIPROFLOXACIN <=0.25 SENSITIVE Sensitive     GENTAMICIN <=1 SENSITIVE Sensitive     IMIPENEM <=0.25 SENSITIVE Sensitive     TRIMETH/SULFA >=320 RESISTANT Resistant     AMPICILLIN/SULBACTAM >=32 RESISTANT Resistant     PIP/TAZO <=4 SENSITIVE Sensitive     Extended ESBL NEGATIVE Sensitive     * ESCHERICHIA COLI  Blood Culture ID Panel (Reflexed)     Status: Abnormal   Collection Time: 06/29/17 12:38 AM  Result Value Ref Range Status    Enterococcus species NOT DETECTED NOT DETECTED Final   Listeria monocytogenes NOT DETECTED NOT DETECTED Final   Staphylococcus species NOT DETECTED NOT DETECTED Final   Staphylococcus aureus NOT DETECTED NOT DETECTED Final   Streptococcus species NOT DETECTED NOT DETECTED Final   Streptococcus agalactiae NOT DETECTED NOT DETECTED Final   Streptococcus pneumoniae NOT DETECTED NOT DETECTED Final   Streptococcus pyogenes NOT DETECTED NOT DETECTED Final   Acinetobacter baumannii NOT DETECTED NOT DETECTED Final   Enterobacteriaceae species DETECTED (A) NOT DETECTED Final    Comment: Enterobacteriaceae represent a large family of gram-negative bacteria, not a single organism. CRITICAL RESULT CALLED TO, READ BACK BY AND VERIFIED WITH: N GLOGOVAC PHARMD 2100 06/30/17 A BROWNING    Enterobacter cloacae complex NOT DETECTED NOT DETECTED Final   Escherichia coli DETECTED (A) NOT DETECTED Final    Comment: CRITICAL RESULT CALLED TO, READ BACK BY AND VERIFIED WITH: N GLOGOVAC PHARMD 2100 06/30/17 A BROWNING    Klebsiella oxytoca NOT DETECTED NOT DETECTED Final   Klebsiella pneumoniae NOT DETECTED NOT DETECTED Final   Proteus species NOT DETECTED NOT DETECTED Final   Serratia marcescens NOT DETECTED NOT DETECTED Final   Carbapenem resistance NOT DETECTED NOT DETECTED Final   Haemophilus influenzae NOT DETECTED NOT DETECTED Final   Neisseria meningitidis NOT DETECTED NOT DETECTED Final   Pseudomonas aeruginosa NOT DETECTED NOT DETECTED Final   Candida albicans NOT DETECTED NOT DETECTED Final   Candida glabrata NOT DETECTED NOT DETECTED Final   Candida krusei NOT DETECTED NOT DETECTED Final   Candida parapsilosis NOT DETECTED NOT DETECTED Final   Candida tropicalis NOT DETECTED NOT DETECTED Final    Comment: Performed at [facility], [place] [address] [place], [place] [address]  Blood culture (routine x 2)     Status: None (Preliminary result)   Collection Time: 06/29/17  2:25 AM  Result Value  Ref Range Status   Specimen Description BLOOD LEFT ANTECUBITAL  Final   Special Requests   Final    BOTTLES DRAWN AEROBIC AND ANAEROBIC Blood Culture adequate volume   Culture  Setup Time  Final    GRAM NEGATIVE RODS AEROBIC BOTTLE ONLY CRITICAL RESULT CALLED TO, READ BACK BY AND VERIFIED WITH: J GADHIA AT 5366 ON [id number] BY SJW Performed at [facility], [place] [address], [place], [place] [address]    Culture GRAM NEGATIVE RODS  Final   Report Status PENDING  Incomplete  Urine Culture     Status: Abnormal   Collection Time: 06/29/17  2:58 AM  Result Value Ref Range Status   Specimen Description URINE, CLEAN CATCH  Final   Special Requests NONE  Final   Culture MULTIPLE ORGANISMS PRESENT, NONE PREDOMINANT (A)  Final   Report Status 06/30/2017 FINAL  Final  Blood culture (routine x 2)     Status: None (Preliminary result)   Collection Time: 06/29/17  7:30 AM  Result Value Ref Range Status   Specimen Description BLOOD BLOOD RIGHT HAND  Final   Special Requests   Final    BOTTLES DRAWN AEROBIC AND ANAEROBIC Blood Culture adequate volume   Culture   Final    NO GROWTH 3 DAYS Performed at [facility], [place] [address], [place], [place] [address]    Report Status PENDING  Incomplete  MRSA PCR Screening     Status: None   Collection Time: 06/29/17 12:11 PM  Result Value Ref Range Status   MRSA by PCR NEGATIVE NEGATIVE Final    Comment:        The GeneXpert MRSA Assay (FDA approved for NASAL specimens only), is one component of a comprehensive MRSA colonization surveillance program. It is not intended to diagnose MRSA infection nor to guide or monitor treatment for MRSA infections.     Radiology Reports Ct Abdomen Pelvis Wo Contrast  Result Date: 06/30/2017 CLINICAL DATA:  Anemia.  Vomiting.  Renal failure. EXAM: CT ABDOMEN AND PELVIS WITHOUT CONTRAST TECHNIQUE: Multidetector CT imaging of the abdomen and pelvis was performed following the standard protocol without  IV contrast. COMPARISON:  Right upper quadrant ultrasound yesterday. FINDINGS: Lower chest: Small bilateral pleural effusions, right greater than left. Associated compressive atelectasis in the right lower lobe. Hepatobiliary: Mild decreased hepatic density suggesting steatosis. No evidence of focal lesion allowing for lack contrast. Gallbladder is decompressed. No calcified gallstone. No biliary dilatation. Pancreas: No pancreatic ductal dilatation or surrounding inflammatory changes. Lack of IV contrast limits detailed assessment. Spleen: Normal in size without focal abnormality. Small splenule inferiorly. Adrenals/Urinary Tract: No adrenal nodule. Mild perinephric edema about both kidneys. Fullness of both renal collecting systems without frank hydronephrosis. Ureters are not well-defined. No urolithiasis. Urinary bladder is physiologically distended. No definite bladder wall thickening. Stomach/Bowel: Stomach physiologically distended with ingested contents. No bowel obstruction with enteric contrast throughout the colon. Appendix filled without contrast and normal. There is no bowel dilatation, evidence of wall thickening or inflammation. Occasional colonic diverticula in the sigmoid colon. No diverticulitis. Vascular/Lymphatic: Mild aortic atherosclerosis without aneurysm. Probable small retroperitoneal lymph nodes, for example left periaortic node measures 8 mm at the level of the knees renal vein. Retroperitoneal structures not well-defined without IV contrast. No evidence pelvic adenopathy. Reproductive: Uterus and bilateral adnexa are unremarkable. Other: Laxity of the anterior abdominal wall left small fat containing umbilical hernia. No free air, free fluid, or intra-abdominal fluid collection. Musculoskeletal: Degenerative change in the lumbar spine and the pubic symphysis. Mild degenerative change of both hips. There are no acute or suspicious osseous abnormalities. IMPRESSION: 1. Mild perinephric  stranding about both kidneys, nonspecific, can be seen with urinary tract infection or  chronic renal disease. 2. Trace pleural effusions, right greater than left. 3. Mild hepatic steatosis.  Mild aortic atherosclerosis. 4. Occasional colonic diverticulosis without diverticulitis. Electronically Signed   By: Jeb Levering M.D.   On: 06/30/2017 16:24   Dg Chest 2 View  Result Date: 06/30/2017 CLINICAL DATA:  71 year old female cough. Prior smoker. Subsequent encounter. EXAM: CHEST  2 VIEW COMPARISON:  06/29/2017 and 08/02/2007. FINDINGS: Small right-sided pleural effusion. Mild pulmonary vascular prominence. No segmental consolidation or pneumothorax. No plain film evidence of pulmonary malignancy. Calcified slightly tortuous aorta. Heart size within normal limits. Shoulder joint degenerative changes. Mild degenerative changes thoracic spine without compression fracture. IMPRESSION: Small right-sided pleural effusion. Mild pulmonary vascular prominence. Aortic atherosclerosis. Electronically Signed   By: Genia Del M.D.   On: 06/30/2017 08:45   Ct Head Wo Contrast  Result Date: 06/29/2017 CLINICAL DATA:  71 year old female with dizziness and fatigue. History of diabetes. EXAM: CT HEAD WITHOUT CONTRAST TECHNIQUE: Contiguous axial images were obtained from the base of the skull through the vertex without intravenous contrast. COMPARISON:  None. FINDINGS: Brain: There is mild age-related atrophy and chronic microvascular ischemic changes. No acute intracranial hemorrhage. No mass effect or midline shift. No extra-axial fluid collection. Vascular: No hyperdense vessel or unexpected calcification. Skull: Normal. Negative for fracture or focal lesion. Sinuses/Orbits: No acute finding. Bilateral exophthalmos. Clinical correlation is recommended. There is mild thickened appearance of the ocular muscles which may represent thyroid opthalmopathy. Clinical correlation is recommended. Other: None IMPRESSION: 1. No  acute intracranial pathology. 2. Mild age-related atrophy and chronic microvascular ischemic changes. 3. Bilateral exophthalmos may be related to underlying thyroid disease. Electronically Signed   By: Anner Crete M.D.   On: 06/29/2017 05:48   US Abdomen Complete  Result Date: 06/29/2017 CLINICAL DATA:  Initial evaluation for acute dizziness. EXAM: ABDOMEN ULTRASOUND COMPLETE COMPARISON:  Prior radiograph from earlier same day. FINDINGS: Gallbladder: No gallstones or wall thickening visualized. A positive sonographic Murphy sign was elicited on exam. Common bile duct: Diameter: 2.2 mm Liver: No focal lesion identified. Within normal limits in parenchymal echogenicity. IVC: No abnormality visualized. Pancreas: Visualized portion unremarkable. Spleen: Size and appearance within normal limits. Right Kidney: Length: 12.9 cm. Echogenicity within normal limits. No mass or hydronephrosis visualized. Left Kidney: Length: 10.9 cm. Echogenicity within normal limits. No mass or hydronephrosis visualized. Abdominal aorta: No aneurysm visualized. Other findings: None. IMPRESSION: 1. Positive sonographic Murphy's sign without cholelithiasis or other sonographic features to suggest acute cholecystitis. No biliary dilatation. 2. Otherwise unremarkable abdominal ultrasound. Electronically Signed   By: Jeannine Boga M.D.   On: 06/29/2017 06:50   US Transvaginal Non-ob  Result Date: 07/01/2017 CLINICAL DATA:  Vaginal bleeding EXAM: TRANSABDOMINAL AND TRANSVAGINAL ULTRASOUND OF PELVIS TECHNIQUE: Both transabdominal and transvaginal ultrasound examinations of the pelvis were performed. Transabdominal technique was performed for global imaging of the pelvis including uterus, ovaries, adnexal regions, and pelvic cul-de-sac. It was necessary to proceed with endovaginal exam following the transabdominal exam to visualize the uterus, endometrium, ovaries and adnexa . COMPARISON:  CT 06/30/2017 FINDINGS: Uterus  Measurements: 9.7 x 4.8 x 6.5 cm. Heterogeneous echotexture. 2.9 x 1.9 x 1.8 cm anterior intramural fibroid. Endometrium Thickness: 12 mm in thickness.  No focal abnormality visualized. Right ovary Measurements: 2.9 x 1.6 x 2.4 cm. Normal appearance/no adnexal mass. Left ovary Measurements: Not visualized. No adnexal mass seen. Other findings No abnormal free fluid. IMPRESSION: Heterogeneous echotexture throughout the uterus can be seen with adenomyosis. Focal 2.9 cm anterior intramural fibroid.  Electronically Signed   By: Rolm Baptise M.D.   On: 07/01/2017 18:07   US Pelvis Complete  Result Date: 07/01/2017 CLINICAL DATA:  Vaginal bleeding EXAM: TRANSABDOMINAL AND TRANSVAGINAL ULTRASOUND OF PELVIS TECHNIQUE: Both transabdominal and transvaginal ultrasound examinations of the pelvis were performed. Transabdominal technique was performed for global imaging of the pelvis including uterus, ovaries, adnexal regions, and pelvic cul-de-sac. It was necessary to proceed with endovaginal exam following the transabdominal exam to visualize the uterus, endometrium, ovaries and adnexa . COMPARISON:  CT 06/30/2017 FINDINGS: Uterus Measurements: 9.7 x 4.8 x 6.5 cm. Heterogeneous echotexture. 2.9 x 1.9 x 1.8 cm anterior intramural fibroid. Endometrium Thickness: 12 mm in thickness.  No focal abnormality visualized. Right ovary Measurements: 2.9 x 1.6 x 2.4 cm. Normal appearance/no adnexal mass. Left ovary Measurements: Not visualized. No adnexal mass seen. Other findings No abnormal free fluid. IMPRESSION: Heterogeneous echotexture throughout the uterus can be seen with adenomyosis. Focal 2.9 cm anterior intramural fibroid. Electronically Signed   By: Rolm Baptise M.D.   On: 07/01/2017 18:07   US Renal  Result Date: 07/01/2017 CLINICAL DATA:  Hematuria EXAM: RENAL / URINARY TRACT ULTRASOUND COMPLETE COMPARISON:  CT 06/30/2017 FINDINGS: Right Kidney: Length: 13.7 cm. Echogenicity within normal limits. No mass or  hydronephrosis visualized. Left Kidney: Length: 13.2 cm. Echogenicity within normal limits. No mass or hydronephrosis visualized. Bladder: Appears normal for degree of bladder distention. IMPRESSION: Unremarkable renal ultrasound. Electronically Signed   By: Rolm Baptise M.D.   On: 07/01/2017 18:06   Dg Abd Acute W/chest  Result Date: 06/29/2017 CLINICAL DATA:  71 year old female with weakness and mid abdominal pain. EXAM: DG ABDOMEN ACUTE W/ 1V CHEST COMPARISON:  Chest radiograph dated 08/02/2007 FINDINGS: Mild diffuse interstitial coarsening primarily involving the lung bases. No focal consolidation, pleural effusion, or pneumothorax. The cardiac silhouette is within normal limits. There degenerative changes of the shoulders. No acute osseous pathology. No bowel dilatation or evidence of obstruction. No free air or radiopaque calculi. The osseous structures and soft tissues appear unremarkable. IMPRESSION: Negative abdominal radiographs.  No acute cardiopulmonary disease. Electronically Signed   By: Anner Crete M.D.   On: 06/29/2017 01:51    Time Spent in minutes  25   Louellen Molder M.D on 07/02/2017 at 2:06 PM  Between 7am to 7pm - Pager - [phone]  After 7pm go to [email or web address] - password [facility]  [facility] -  Office  [phone]

## 2017-07-02 NOTE — Progress Notes (Signed)
No reactions from Feraheme infusion. Vital signs obtained.

## 2017-07-02 NOTE — Progress Notes (Signed)
Patient transferred fro ICU. Alert and oriented x 3. oriented to room and environment. Vital signs checked and documented.Ambulatory in the room. Will continue to monitor.

## 2017-07-02 NOTE — Care Management Note (Signed)
Case Management Note  Patient Details  Name: [name] [name] MRN: [id number] Date of Birth: October 14, 1946  Subjective/Objective:                    Action/Plan: Date:  July 02, 2017  Chart reviewed for concurrent status and case management needs.  Will continue to follow patient progress.  Discharge Planning: following for needs /will give information concerning finding a pcp and the [facility] Expected discharge date: [id number]  Velva Harman, BSN, [place], [place]   Expected Discharge Date:   (unknown)               Expected Discharge Plan:  Home/Self Care  In-House Referral:  PCP / [facility]  Discharge planning Services  CM Consult, [facility]  Post Acute Care Choice:    Choice offered to:  Patient  DME Arranged:    DME Agency:     HH Arranged:    [place] Agency:     Status of Service:  In process, will continue to follow  If discussed at Long Length of Stay Meetings, dates discussed:    Additional Comments:  Leeroy Cha, RN 07/02/2017, 9:45 AM

## 2017-07-03 ENCOUNTER — Inpatient Hospital Stay (HOSPITAL_COMMUNITY): Payer: Medicare (Managed Care)

## 2017-07-03 ENCOUNTER — Ambulatory Visit (HOSPITAL_COMMUNITY)
Admit: 2017-07-03 | Discharge: 2017-07-03 | Disposition: A | Discharge status: Home / Self Care | Payer: Medicare (Managed Care) | Attending: Internal Medicine | Admitting: Internal Medicine

## 2017-07-03 DIAGNOSIS — R195 Other fecal abnormalities: Secondary | ICD-10-CM

## 2017-07-03 DIAGNOSIS — M5440 Lumbago with sciatica, unspecified side: Secondary | ICD-10-CM

## 2017-07-03 DIAGNOSIS — K625 Hemorrhage of anus and rectum: Secondary | ICD-10-CM | POA: Diagnosis present

## 2017-07-03 DIAGNOSIS — D500 Iron deficiency anemia secondary to blood loss (chronic): Secondary | ICD-10-CM | POA: Diagnosis present

## 2017-07-03 LAB — CBC
HCT: 24.9 % — ABNORMAL LOW (ref 36.0–46.0)
Hemoglobin: 7.6 g/dL — ABNORMAL LOW (ref 12.0–15.0)
MCH: 21.6 pg — AB (ref 26.0–34.0)
MCHC: 30.5 g/dL (ref 30.0–36.0)
MCV: 70.7 fL — AB (ref 78.0–100.0)
PLATELETS: 354 10*3/uL (ref 150–400)
RBC: 3.52 MIL/uL — ABNORMAL LOW (ref 3.87–5.11)
RDW: 25.5 % — AB (ref 11.5–15.5)
WBC: 14.9 10*3/uL — AB (ref 4.0–10.5)

## 2017-07-03 LAB — GLUCOSE, CAPILLARY
GLUCOSE-CAPILLARY: 148 mg/dL — AB (ref 65–99)
Glucose-Capillary: 162 mg/dL — ABNORMAL HIGH (ref 65–99)
Glucose-Capillary: 209 mg/dL — ABNORMAL HIGH (ref 65–99)

## 2017-07-03 MED ORDER — ZOLPIDEM TARTRATE 10 MG PO TABS
10.0000 mg | ORAL_TABLET | Freq: Every evening | ORAL | Status: DC | PRN
  Administered 2017-07-04: 10 mg via ORAL
  Filled 2017-07-03: qty 1

## 2017-07-03 MED ORDER — PEG-KCL-NACL-NASULF-NA ASC-C 100 G PO SOLR
0.5000 | Freq: Once | ORAL | Status: AC
  Administered 2017-07-03: 100 g via ORAL
  Filled 2017-07-03: qty 1

## 2017-07-03 MED ORDER — GADOBENATE DIMEGLUMINE 529 MG/ML IV SOLN
20.0000 mL | Freq: Once | INTRAVENOUS | Status: AC
  Administered 2017-07-03: 20 mL via INTRAVENOUS

## 2017-07-03 MED ORDER — PEG-KCL-NACL-NASULF-NA ASC-C 100 G PO SOLR: 1.0000 | Freq: Once | ORAL | Status: DC

## 2017-07-03 MED ORDER — PEG-KCL-NACL-NASULF-NA ASC-C 100 G PO SOLR
0.5000 | Freq: Once | ORAL | Status: AC
  Administered 2017-07-04: 100 g via ORAL

## 2017-07-03 MED ORDER — METHOCARBAMOL 1000 MG/10ML IJ SOLN
500.0000 mg | Freq: Four times a day (QID) | INTRAVENOUS | Status: DC | PRN
  Administered 2017-07-03: 500 mg via INTRAVENOUS
  Filled 2017-07-03: qty 5
  Filled 2017-07-03: qty 550

## 2017-07-03 MED ORDER — CEFUROXIME AXETIL 500 MG PO TABS
500.0000 mg | ORAL_TABLET | Freq: Two times a day (BID) | ORAL | Status: DC
  Administered 2017-07-03 – 2017-07-04 (×3): 500 mg via ORAL
  Filled 2017-07-03 (×4): qty 1

## 2017-07-03 NOTE — Progress Notes (Addendum)
PROGRESS NOTE                                                                                                                                                                                                             Patient Demographics:    [name] [name], is a 71 y.o. female, DOB - 28-May-1946, YHC:[id number]  Admit date - 06/29/2017   Admitting Physician Elmarie Shiley, MD  Outpatient Primary MD for the patient is System, Pcp Not In  LOS - 4  Outpatient Specialists:None  Chief Complaint  Patient presents with  . Nausea  . Emesis       Brief Narrative   71 year old female with history of uncontrolled type 2 diabetes mellitus who is visiting her daughter in [place] presented with Marlou Starks 3 days of dizziness, nausea and poor appetite with generalized weakness. She also reported subjective fever with chills. Denied any dysuria, hematuria or diarrhea. Denied any chest pain reported having persistent productive cough. Patient reported being started on Lasix one month back in [place] for lower extremity edema. Also reported having a cardiac cath without stent placed many years ago.  In the ED she was septic with fever, tachycardia and leukocytosis with WBC of 23 K.. She was also hypotensive and received IV fluid bolus. Had elevated lactic acid of 2.2 along with acute kidney injury with creatinine of 2.. UA was positive for UTI. Her hemoglobin was found to be 5 and transfuse with 4 unit PRBC.   Subjective:   Complains of low back pain since last night. Reports pain to be localized in the low back and nonradiating. Also noticed blood mixed stool and more bright red blood in the toilet paper when wiping after bowel movement.. Reports she has not been able to sleep last few days.   Assessment  & Plan :    Principal Problem: Severe iron deficiency anemia. Received for unit PRBC and 1 dose of IV iron. Has severe iron deficiency  anemia. Hemoglobin still 7.6 and now complains of having an episode of blood mixed stool. 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 GI consulted and plan on EGD and colonoscopy tomorrow. Monitor H&H closely.   Active problem   Sepsis With septic shock ([place]) Resolved. Leukocytosis improving. Possibly secondary to Escherichia coli UTI with bacteremia. Sensitive to cephalosporin antibiotic changed to Ceftin. (Plan  on total 2 weeks of treatment until 7/23. Urine culture with mixed growth.    Active Problems:     Diabetes mellitus type 2 in obese ([place]) Only on metformin. A1c of 6.8. On moderate sliding scale coverage and daily Lantus.   Acute kidney injury ([place]) Secondary to septic shock. Resolved   ? acute bronchitis Cough resolved. On empiric antibiotics.  Obstructive sleep apnea Continue nighttime CPAP  Low back pain New symptoms. X-ray of the lumbar spine shows degenerative changes in the mid and lower lumbar spine (mild to moderate). Also shows degenerative changes in the posterior lower lumbar facet with associated osseous hypertrophy. No signs of nerve impingement or radiculopathy. Continue Tylenol for pain.  Vitamin B12 deficiency Low normal levels. Added supplement.  Insomnia Increase Ambien dose to 10 mg when necessary at bedtime.  Code Status : full code  Family Communication  : None at bedside  Disposition Plan  : Home pending EGD and colonoscopy  Barriers For Discharge : Active symptoms, EGD and colonoscopy  Consults  : [facility]  Procedures  : CT abdomen and pelvis  DVT Prophylaxis  :  SCDs  Lab Results  Component Value Date   PLT 354 07/03/2017    Antibiotics  :    Anti-infectives    Start     Dose/Rate Route Frequency Ordered Stop   07/03/17 1000  cefUROXime (CEFTIN) tablet 500 mg     500 mg Oral 2 times daily with meals 07/03/17 0923 07/13/17 0759   06/30/17 2200  cefTRIAXone (ROCEPHIN) 2 g in dextrose 5 % 50 mL IVPB   Status:  Discontinued     2 g 100 mL/hr over 30 Minutes Intravenous Every 24 hours 06/30/17 2118 07/03/17 0923   06/29/17 2300  cefTRIAXone (ROCEPHIN) 1 g in dextrose 5 % 50 mL IVPB  Status:  Discontinued     1 g 100 mL/hr over 30 Minutes Intravenous Every 24 hours 06/29/17 0902 06/30/17 2117   06/29/17 2300  azithromycin (ZITHROMAX) 500 mg in dextrose 5 % 250 mL IVPB  Status:  Discontinued     500 mg 250 mL/hr over 60 Minutes Intravenous Every 24 hours 06/29/17 0902 06/30/17 2132   06/29/17 0945  piperacillin-tazobactam (ZOSYN) IVPB 3.375 g  Status:  Discontinued     3.375 g 100 mL/hr over 30 Minutes Intravenous  Once 06/29/17 0933 06/29/17 1012   06/29/17 0945  vancomycin (VANCOCIN) IVPB 1000 mg/200 mL premix  Status:  Discontinued     1,000 mg 200 mL/hr over 60 Minutes Intravenous  Once 06/29/17 0933 06/29/17 1012   06/29/17 0200  cefTRIAXone (ROCEPHIN) 1 g in dextrose 5 % 50 mL IVPB     1 g 100 mL/hr over 30 Minutes Intravenous  Once 06/29/17 0159 06/29/17 0258   06/29/17 0200  azithromycin (ZITHROMAX) 500 mg in dextrose 5 % 250 mL IVPB     500 mg 250 mL/hr over 60 Minutes Intravenous  Once 06/29/17 0159 06/29/17 0450        Objective:   Vitals:   07/02/17 1407 07/02/17 1527 07/02/17 2039 07/03/17 0450  BP: 140/71 125/61 136/70 117/60  Pulse: 79 75 74 74  Resp: _0 Temp:   98.1 F (36.7 C) (!) 97.4 F (36.3 C)  TempSrc:  Oral Oral Oral  SpO2: 98% 98% 99% 99%  Weight:      Height:        Wt Readings from Last 3 Encounters:  07/02/17 93.8 kg (206 lb  12.7 oz)     Intake/Output Summary (Last 24 hours) at 07/03/17 1158 Last data filed at 07/03/17 1028  Gross per 24 hour  Intake              290 ml  Output                0 ml  Net              290 ml    Physical exam Gen.: Elderly female not in distress HEENT: Pallor present, moist mucosa, supple neck Chest: Clear bilaterally CVS: Normal S1 and S2, no murmurs GI: Soft, nondistended, nontender [place]  schedule: Warm, no edema, left lower lumbar tenderness       Data Review:    CBC  Recent Labs Lab 06/29/17 0040  06/30/17 0359 06/30/17 0908 07/01/17 0958 07/02/17 0254 07/03/17 0355  WBC 23.6*  --  21.7* 21.9* 20.9* 18.8* 14.9*  HGB 5.1*  < > 7.9* 8.4* 8.0* 7.5* 7.6*  HCT 17.5*  < > 25.1* 26.1* 25.1* 24.0* 24.9*  PLT 348  --  268 294 328 329 354  MCV 60.6*  --  68.8* 67.4* 67.5* 69.2* 70.7*  MCH 17.6*  --  21.6* 21.7* 21.5* 21.6* 21.6*  MCHC 29.1*  --  31.5 32.2 31.9 31.3 30.5  RDW 18.2*  --  22.8* 22.8* 23.8* 24.8* 25.5*  LYMPHSABS 0.9  --   --   --   --   --   --   MONOABS 2.6*  --   --   --   --   --   --   EOSABS 0.0  --   --   --   --   --   --   BASOSABS 0.0  --   --   --   --   --   --   < > = values in this interval not displayed.  Chemistries   Recent Labs Lab 06/29/17 0040 06/29/17 1710 06/30/17 0359 07/01/17 0958  NA 126*  --  134* 138  K 4.2  --  4.1 3.5  CL 94*  --  102 104  CO2 22  --  22 25  GLUCOSE 329*  --  260* 232*  BUN 21*  --  16 12  CREATININE 2.05*  --  1.34* 1.04*  CALCIUM 8.2*  --  8.2* 8.8*  AST  --  34  --   --   ALT  --  20  --   --   ALKPHOS  --  58  --   --   BILITOT  --  0.7  --   --    ------------------------------------------------------------------------------------------------------------------ No results for input(s): CHOL, HDL, LDLCALC, TRIG, CHOLHDL, LDLDIRECT in the last 72 hours.  Lab Results  Component Value Date   HGBA1C 6.8 (H) 07/01/2017   ------------------------------------------------------------------------------------------------------------------ No results for input(s): TSH, T4TOTAL, T3FREE, THYROIDAB in the last 72 hours.  Invalid input(s): FREET3 ------------------------------------------------------------------------------------------------------------------ No results for input(s): VITAMINB12, FOLATE, FERRITIN, TIBC, IRON, RETICCTPCT in the last 72 hours.  Coagulation profile  Recent  Labs Lab 06/29/17 1710  INR 1.22    No results for input(s): DDIMER in the last 72 hours.  Cardiac Enzymes No results for input(s): CKMB, TROPONINI, MYOGLOBIN in the last 168 hours.  Invalid input(s): CK ------------------------------------------------------------------------------------------------------------------ No results found for: BNP  Inpatient Medications  Scheduled Meds: . cefUROXime  500 mg Oral BID WC  . insulin aspart  0-15 Units Subcutaneous TID WC  .  insulin aspart  0-5 Units Subcutaneous QHS  . insulin aspart  3 Units Subcutaneous TID WC  . insulin glargine  10 Units Subcutaneous Daily  . peg 3350 powder  0.5 kit Oral Once   And  . [START ON 07/04/2017] peg 3350 powder  0.5 kit Oral Once  . vitamin B-12  1,000 mcg Oral Daily   Continuous Infusions: . sodium chloride     PRN Meds:.acetaminophen **OR** acetaminophen, ipratropium-albuterol, ondansetron **OR** ondansetron (ZOFRAN) IV, zolpidem  Micro Results Recent Results (from the past 240 hour(s))  Culture, blood (single)     Status: Abnormal (Preliminary result)   Collection Time: 06/29/17 12:38 AM  Result Value Ref Range Status   Specimen Description BLOOD RIGHT ANTECUBITAL  Final   Special Requests   Final    BOTTLES DRAWN AEROBIC AND ANAEROBIC Blood Culture adequate volume   Culture  Setup Time   Final    GRAM NEGATIVE RODS ANAEROBIC BOTTLE ONLY CRITICAL RESULT CALLED TO, READ BACK BY AND VERIFIED WITH: N GLOGOVAC PHARMD 2100 06/30/17 A BROWNING    Culture ESCHERICHIA COLI (A)  Final   Report Status PENDING  Incomplete   Organism ID, Bacteria ESCHERICHIA COLI  Final      Susceptibility   Escherichia coli - MIC*    AMPICILLIN >=32 RESISTANT Resistant     CEFAZOLIN <=4 SENSITIVE Sensitive     CEFEPIME <=1 SENSITIVE Sensitive     CEFTAZIDIME <=1 SENSITIVE Sensitive     CEFTRIAXONE <=1 SENSITIVE Sensitive     CIPROFLOXACIN <=0.25 SENSITIVE Sensitive     GENTAMICIN <=1 SENSITIVE Sensitive      IMIPENEM <=0.25 SENSITIVE Sensitive     TRIMETH/SULFA >=320 RESISTANT Resistant     AMPICILLIN/SULBACTAM >=32 RESISTANT Resistant     PIP/TAZO <=4 SENSITIVE Sensitive     Extended ESBL Value in next row Sensitive      NEGATIVEPerformed at [facility], [address] [place], [place] [address]    * ESCHERICHIA COLI  Blood Culture ID Panel (Reflexed)     Status: Abnormal   Collection Time: 06/29/17 12:38 AM  Result Value Ref Range Status   Enterococcus species NOT DETECTED NOT DETECTED Final   Listeria monocytogenes NOT DETECTED NOT DETECTED Final   Staphylococcus species NOT DETECTED NOT DETECTED Final   Staphylococcus aureus NOT DETECTED NOT DETECTED Final   Streptococcus species NOT DETECTED NOT DETECTED Final   Streptococcus agalactiae NOT DETECTED NOT DETECTED Final   Streptococcus pneumoniae NOT DETECTED NOT DETECTED Final   Streptococcus pyogenes NOT DETECTED NOT DETECTED Final   Acinetobacter baumannii NOT DETECTED NOT DETECTED Final   Enterobacteriaceae species DETECTED (A) NOT DETECTED Final    Comment: Enterobacteriaceae represent a large family of gram-negative bacteria, not a single organism. CRITICAL RESULT CALLED TO, READ BACK BY AND VERIFIED WITH: N GLOGOVAC PHARMD 2100 06/30/17 A BROWNING    Enterobacter cloacae complex NOT DETECTED NOT DETECTED Final   Escherichia coli DETECTED (A) NOT DETECTED Final    Comment: CRITICAL RESULT CALLED TO, READ BACK BY AND VERIFIED WITH: N GLOGOVAC PHARMD 2100 06/30/17 A BROWNING    Klebsiella oxytoca NOT DETECTED NOT DETECTED Final   Klebsiella pneumoniae NOT DETECTED NOT DETECTED Final   Proteus species NOT DETECTED NOT DETECTED Final   Serratia marcescens NOT DETECTED NOT DETECTED Final   Carbapenem resistance NOT DETECTED NOT DETECTED Final   Haemophilus influenzae NOT DETECTED NOT DETECTED Final   Neisseria meningitidis NOT DETECTED NOT DETECTED Final   Pseudomonas aeruginosa NOT DETECTED  NOT DETECTED Final   Candida  albicans NOT DETECTED NOT DETECTED Final   Candida glabrata NOT DETECTED NOT DETECTED Final   Candida krusei NOT DETECTED NOT DETECTED Final   Candida parapsilosis NOT DETECTED NOT DETECTED Final   Candida tropicalis NOT DETECTED NOT DETECTED Final    Comment: Performed at [facility], [place] [address] [place], [place] [address]  Blood culture (routine x 2)     Status: None (Preliminary result)   Collection Time: 06/29/17  2:25 AM  Result Value Ref Range Status   Specimen Description BLOOD LEFT ANTECUBITAL  Final   Special Requests   Final    BOTTLES DRAWN AEROBIC AND ANAEROBIC Blood Culture adequate volume   Culture  Setup Time   Final    GRAM NEGATIVE RODS AEROBIC BOTTLE ONLY CRITICAL RESULT CALLED TO, READ BACK BY AND VERIFIED WITH: J GADHIA AT 2979 ON [id number] BY SJW Performed at [facility], [place] [address], [place], [place] [address]    Culture GRAM NEGATIVE RODS  Final   Report Status PENDING  Incomplete  Urine Culture     Status: Abnormal   Collection Time: 06/29/17  2:58 AM  Result Value Ref Range Status   Specimen Description URINE, CLEAN CATCH  Final   Special Requests NONE  Final   Culture MULTIPLE ORGANISMS PRESENT, NONE PREDOMINANT (A)  Final   Report Status 06/30/2017 FINAL  Final  Blood culture (routine x 2)     Status: None (Preliminary result)   Collection Time: 06/29/17  7:30 AM  Result Value Ref Range Status   Specimen Description BLOOD BLOOD RIGHT HAND  Final   Special Requests   Final    BOTTLES DRAWN AEROBIC AND ANAEROBIC Blood Culture adequate volume   Culture   Final    NO GROWTH 4 DAYS Performed at [facility], [place] [address], [place], [place] [address]    Report Status PENDING  Incomplete  MRSA PCR Screening     Status: None   Collection Time: 06/29/17 12:11 PM  Result Value Ref Range Status   MRSA by PCR NEGATIVE NEGATIVE Final    Comment:        The GeneXpert MRSA Assay (FDA approved for NASAL specimens only), is one component  of a comprehensive MRSA colonization surveillance program. It is not intended to diagnose MRSA infection nor to guide or monitor treatment for MRSA infections.     Radiology Reports Ct Abdomen Pelvis Wo Contrast  Result Date: 06/30/2017 CLINICAL DATA:  Anemia.  Vomiting.  Renal failure. EXAM: CT ABDOMEN AND PELVIS WITHOUT CONTRAST TECHNIQUE: Multidetector CT imaging of the abdomen and pelvis was performed following the standard protocol without IV contrast. COMPARISON:  Right upper quadrant ultrasound yesterday. FINDINGS: Lower chest: Small bilateral pleural effusions, right greater than left. Associated compressive atelectasis in the right lower lobe. Hepatobiliary: Mild decreased hepatic density suggesting steatosis. No evidence of focal lesion allowing for lack contrast. Gallbladder is decompressed. No calcified gallstone. No biliary dilatation. Pancreas: No pancreatic ductal dilatation or surrounding inflammatory changes. Lack of IV contrast limits detailed assessment. Spleen: Normal in size without focal abnormality. Small splenule inferiorly. Adrenals/Urinary Tract: No adrenal nodule. Mild perinephric edema about both kidneys. Fullness of both renal collecting systems without frank hydronephrosis. Ureters are not well-defined. No urolithiasis. Urinary bladder is physiologically distended. No definite bladder wall thickening. Stomach/Bowel: Stomach physiologically distended with ingested contents. No bowel obstruction with enteric contrast throughout the colon. Appendix filled without contrast and normal.  There is no bowel dilatation, evidence of wall thickening or inflammation. Occasional colonic diverticula in the sigmoid colon. No diverticulitis. Vascular/Lymphatic: Mild aortic atherosclerosis without aneurysm. Probable small retroperitoneal lymph nodes, for example left periaortic node measures 8 mm at the level of the knees renal vein. Retroperitoneal structures not well-defined without IV  contrast. No evidence pelvic adenopathy. Reproductive: Uterus and bilateral adnexa are unremarkable. Other: Laxity of the anterior abdominal wall left small fat containing umbilical hernia. No free air, free fluid, or intra-abdominal fluid collection. Musculoskeletal: Degenerative change in the lumbar spine and the pubic symphysis. Mild degenerative change of both hips. There are no acute or suspicious osseous abnormalities. IMPRESSION: 1. Mild perinephric stranding about both kidneys, nonspecific, can be seen with urinary tract infection or chronic renal disease. 2. Trace pleural effusions, right greater than left. 3. Mild hepatic steatosis.  Mild aortic atherosclerosis. 4. Occasional colonic diverticulosis without diverticulitis. Electronically Signed   By: Jeb Levering M.D.   On: 06/30/2017 16:24   Dg Chest 2 View  Result Date: 06/30/2017 CLINICAL DATA:  71 year old female cough. Prior smoker. Subsequent encounter. EXAM: CHEST  2 VIEW COMPARISON:  06/29/2017 and 08/02/2007. FINDINGS: Small right-sided pleural effusion. Mild pulmonary vascular prominence. No segmental consolidation or pneumothorax. No plain film evidence of pulmonary malignancy. Calcified slightly tortuous aorta. Heart size within normal limits. Shoulder joint degenerative changes. Mild degenerative changes thoracic spine without compression fracture. IMPRESSION: Small right-sided pleural effusion. Mild pulmonary vascular prominence. Aortic atherosclerosis. Electronically Signed   By: Genia Del M.D.   On: 06/30/2017 08:45   Dg Lumbar Spine Complete  Result Date: 07/03/2017 CLINICAL DATA:  Pain across lower back started last night; no injury EXAM: LUMBAR SPINE - COMPLETE 4+ VIEW COMPARISON:  Plain film of the lumbar spine dated 11/28/2004. FINDINGS: Alignment is normal. No fracture line or displaced fracture fragment seen. No acute or suspicious osseous lesion. No evidence of pars interarticularis defect. Upper sacrum appears intact  and normally aligned. Degenerative facet hypertrophy at the L3 through S1 levels and mild disc desiccations within the lower lumbar spine. Visualized paravertebral soft tissues are unremarkable. IMPRESSION: 1. No acute findings. 2. Degenerative changes of the mid and lower lumbar spine, mild to moderate in degree. Most prominent degenerative change noted amongst the posterior facets of the lower lumbar spine, with associated osseous hypertrophy, which can result in nerve root impingement. If any radiculopathic symptoms, would consider nonemergent lumbar spine MRI for further characterization. Electronically Signed   By: Franki Cabot M.D.   On: 07/03/2017 11:26   Ct Head Wo Contrast  Result Date: 06/29/2017 CLINICAL DATA:  71 year old female with dizziness and fatigue. History of diabetes. EXAM: CT HEAD WITHOUT CONTRAST TECHNIQUE: Contiguous axial images were obtained from the base of the skull through the vertex without intravenous contrast. COMPARISON:  None. FINDINGS: Brain: There is mild age-related atrophy and chronic microvascular ischemic changes. No acute intracranial hemorrhage. No mass effect or midline shift. No extra-axial fluid collection. Vascular: No hyperdense vessel or unexpected calcification. Skull: Normal. Negative for fracture or focal lesion. Sinuses/Orbits: No acute finding. Bilateral exophthalmos. Clinical correlation is recommended. There is mild thickened appearance of the ocular muscles which may represent thyroid opthalmopathy. Clinical correlation is recommended. Other: None IMPRESSION: 1. No acute intracranial pathology. 2. Mild age-related atrophy and chronic microvascular ischemic changes. 3. Bilateral exophthalmos may be related to underlying thyroid disease. Electronically Signed   By: Anner Crete M.D.   On: 06/29/2017 05:48   US Abdomen Complete  Result Date: 06/29/2017  CLINICAL DATA:  Initial evaluation for acute dizziness. EXAM: ABDOMEN ULTRASOUND COMPLETE COMPARISON:   Prior radiograph from earlier same day. FINDINGS: Gallbladder: No gallstones or wall thickening visualized. A positive sonographic Murphy sign was elicited on exam. Common bile duct: Diameter: 2.2 mm Liver: No focal lesion identified. Within normal limits in parenchymal echogenicity. IVC: No abnormality visualized. Pancreas: Visualized portion unremarkable. Spleen: Size and appearance within normal limits. Right Kidney: Length: 12.9 cm. Echogenicity within normal limits. No mass or hydronephrosis visualized. Left Kidney: Length: 10.9 cm. Echogenicity within normal limits. No mass or hydronephrosis visualized. Abdominal aorta: No aneurysm visualized. Other findings: None. IMPRESSION: 1. Positive sonographic Murphy's sign without cholelithiasis or other sonographic features to suggest acute cholecystitis. No biliary dilatation. 2. Otherwise unremarkable abdominal ultrasound. Electronically Signed   By: Jeannine Boga M.D.   On: 06/29/2017 06:50   US Transvaginal Non-ob  Result Date: 07/01/2017 CLINICAL DATA:  Vaginal bleeding EXAM: TRANSABDOMINAL AND TRANSVAGINAL ULTRASOUND OF PELVIS TECHNIQUE: Both transabdominal and transvaginal ultrasound examinations of the pelvis were performed. Transabdominal technique was performed for global imaging of the pelvis including uterus, ovaries, adnexal regions, and pelvic cul-de-sac. It was necessary to proceed with endovaginal exam following the transabdominal exam to visualize the uterus, endometrium, ovaries and adnexa . COMPARISON:  CT 06/30/2017 FINDINGS: Uterus Measurements: 9.7 x 4.8 x 6.5 cm. Heterogeneous echotexture. 2.9 x 1.9 x 1.8 cm anterior intramural fibroid. Endometrium Thickness: 12 mm in thickness.  No focal abnormality visualized. Right ovary Measurements: 2.9 x 1.6 x 2.4 cm. Normal appearance/no adnexal mass. Left ovary Measurements: Not visualized. No adnexal mass seen. Other findings No abnormal free fluid. IMPRESSION: Heterogeneous echotexture  throughout the uterus can be seen with adenomyosis. Focal 2.9 cm anterior intramural fibroid. Electronically Signed   By: Rolm Baptise M.D.   On: 07/01/2017 18:07   US Pelvis Complete  Result Date: 07/01/2017 CLINICAL DATA:  Vaginal bleeding EXAM: TRANSABDOMINAL AND TRANSVAGINAL ULTRASOUND OF PELVIS TECHNIQUE: Both transabdominal and transvaginal ultrasound examinations of the pelvis were performed. Transabdominal technique was performed for global imaging of the pelvis including uterus, ovaries, adnexal regions, and pelvic cul-de-sac. It was necessary to proceed with endovaginal exam following the transabdominal exam to visualize the uterus, endometrium, ovaries and adnexa . COMPARISON:  CT 06/30/2017 FINDINGS: Uterus Measurements: 9.7 x 4.8 x 6.5 cm. Heterogeneous echotexture. 2.9 x 1.9 x 1.8 cm anterior intramural fibroid. Endometrium Thickness: 12 mm in thickness.  No focal abnormality visualized. Right ovary Measurements: 2.9 x 1.6 x 2.4 cm. Normal appearance/no adnexal mass. Left ovary Measurements: Not visualized. No adnexal mass seen. Other findings No abnormal free fluid. IMPRESSION: Heterogeneous echotexture throughout the uterus can be seen with adenomyosis. Focal 2.9 cm anterior intramural fibroid. Electronically Signed   By: Rolm Baptise M.D.   On: 07/01/2017 18:07   US Renal  Result Date: 07/01/2017 CLINICAL DATA:  Hematuria EXAM: RENAL / URINARY TRACT ULTRASOUND COMPLETE COMPARISON:  CT 06/30/2017 FINDINGS: Right Kidney: Length: 13.7 cm. Echogenicity within normal limits. No mass or hydronephrosis visualized. Left Kidney: Length: 13.2 cm. Echogenicity within normal limits. No mass or hydronephrosis visualized. Bladder: Appears normal for degree of bladder distention. IMPRESSION: Unremarkable renal ultrasound. Electronically Signed   By: Rolm Baptise M.D.   On: 07/01/2017 18:06   Dg Abd Acute W/chest  Result Date: 06/29/2017 CLINICAL DATA:  71 year old female with weakness and mid abdominal  pain. EXAM: DG ABDOMEN ACUTE W/ 1V CHEST COMPARISON:  Chest radiograph dated 08/02/2007 FINDINGS: Mild diffuse interstitial coarsening primarily involving the lung bases.  No focal consolidation, pleural effusion, or pneumothorax. The cardiac silhouette is within normal limits. There degenerative changes of the shoulders. No acute osseous pathology. No bowel dilatation or evidence of obstruction. No free air or radiopaque calculi. The osseous structures and soft tissues appear unremarkable. IMPRESSION: Negative abdominal radiographs.  No acute cardiopulmonary disease. Electronically Signed   By: Anner Crete M.D.   On: 06/29/2017 01:51    Time Spent in minutes  25   Louellen Molder M.D on 07/03/2017 at 11:58 AM  Between 7am to 7pm - Pager - [phone]  After 7pm go to [email or web address] - password [email or web address]  [facility] -  Office  [phone]

## 2017-07-03 NOTE — Progress Notes (Signed)
Pt. ready to be placed on BiPAP, humidity filled, tolerating well on room air, RT to monitor.

## 2017-07-03 NOTE — Progress Notes (Signed)
Pt is leaving out with [facility] at this time for procedure at [facility]. Pt denied pain at time of transfer lungs clear, NAD noted.

## 2017-07-03 NOTE — Consult Note (Signed)
Referring Provider: Dr. Clementeen Graham, [facility] Primary Care Physician:  System, Pcp Not In Primary Gastroenterologist:  Unassigned   Reason for Consultation:  IDA  HPI: [name] [name] is a 71 y.o. female with history of uncontrolled type 2 diabetes mellitus who is visiting her children in [place] from [place] and presented to [facility] ED on 7/9 with complaints of dizziness, nausea, and poor appetite with generalized weakness.  In the ED she was septic with fever, tachycardia and leukocytosis with WBC of 23 K.. She was also hypotensive and received IV fluid bolus. Had elevated lactic acid of 2.2 along with acute kidney injury with creatinine of 2. UA was positive for UTI. Her hemoglobin was found to be 5.1 grams and transfuse with 4 unit PRBC.  Also given a dose of Feraheme.  Initially she was complaining of vaginal bleeding so that was evaluated.  She was given an appt in our office for next week to discuss and set up GI evaluation (likely EGD and colonoscopy) for her IDA sicne she was found to be hemoccult negative.  Now this AM she reports that she was having rectal bleeding.  She tells me that she urinated and there was no blood, then she had a BM and there was bright red blood in the toilet.  When she wiped she knew that it was coming from her rectum.  Unsure if this was the source of bleeding all along, but regardless, she says that all of this bleeding has only been an issue since she has been in the hospital.  Hgb is now 7.6 grams and only reached a peak of 8.4 grams.  Iron studies very low (ferritin low normal).  Has never seen GI in the past.  She says that she moves her bowels regularly, every day without issues.  Has never seen blood in the past.  Denies abdominal pain.  No complaints of heartburn, reflux, indigestion.  Denies family history of GI malignancy or other major issues.  No NSAID use.   Past Medical History:  Diagnosis Date  . Diabetes mellitus without complication [facility])     Past Surgical  History:  Procedure Laterality Date  . CESAREAN SECTION      Prior to Admission medications   Medication Sig Start Date End Date Taking? Authorizing Provider  albuterol (PROVENTIL HFA;VENTOLIN HFA) 108 (90 Base) MCG/ACT inhaler Inhale 2 puffs into the lungs every 6 (six) hours as needed for wheezing or shortness of breath.   Yes [provider]  aspirin 81 MG tablet Take 81 mg by mouth daily.   Yes [provider]  metFORMIN (GLUCOPHAGE) 1000 MG tablet Take 1,000 mg by mouth 2 (two) times daily with a meal.   Yes [provider]  montelukast (SINGULAIR) 10 MG tablet Take 10 mg by mouth daily.   Yes [provider]  ramipril (ALTACE) 2.5 MG capsule Take 2.5 mg by mouth daily.   Yes [provider]    Current Facility-Administered Medications  Medication Dose Route Frequency Provider Last Rate Last Dose  . acetaminophen (TYLENOL) tablet 650 mg  650 mg Oral Q6H PRN Rise Patience, MD   650 mg at 07/02/17 2253   Or  . acetaminophen (TYLENOL) suppository 650 mg  650 mg Rectal Q6H PRN Rise Patience, MD      . cefUROXime (CEFTIN) tablet 500 mg  500 mg Oral BID WC Dhungel, Nishant, MD      . insulin aspart (novoLOG) injection 0-15 Units  0-15 Units  Subcutaneous TID WC Dhungel, Nishant, MD   2 Units at 07/02/17 1837  . insulin aspart (novoLOG) injection 0-5 Units  0-5 Units Subcutaneous QHS Dhungel, Nishant, MD   2 Units at 07/01/17 2251  . insulin aspart (novoLOG) injection 3 Units  3 Units Subcutaneous TID WC Dhungel, Nishant, MD   3 Units at 07/02/17 1838  . insulin glargine (LANTUS) injection 10 Units  10 Units Subcutaneous Daily Dhungel, Nishant, MD   10 Units at 07/02/17 0941  . ipratropium-albuterol (DUONEB) 0.5-2.5 (3) MG/3ML nebulizer solution 3 mL  3 mL Nebulization Q6H PRN Regalado, Belkys A, MD      . ondansetron (ZOFRAN) tablet 4 mg  4 mg Oral Q6H PRN Rise Patience, MD       Or  . ondansetron [facility]) injection 4 mg  4 mg  Intravenous Q6H PRN Rise Patience, MD      . sodium chloride 0.9 % bolus 2,000 mL  2,000 mL Intravenous Once Rise Patience, MD      . vitamin B-12 (CYANOCOBALAMIN) tablet 1,000 mcg  1,000 mcg Oral Daily Dhungel, Nishant, MD   1,000 mcg at 07/02/17 0939  . zolpidem (AMBIEN) tablet 5 mg  5 mg Oral QHS PRN Gardiner Barefoot, NP   5 mg at 07/01/17 2250    Allergies as of 06/29/2017  . (No Known Allergies)    Family History  Problem Relation Age of Onset  . CAD Neg Hx   . Diabetes Mellitus II Neg Hx     Social History   Social History  . Marital status: Divorced    Spouse name: N/A  . Number of children: N/A  . Years of education: N/A   Occupational History  . Not on file.   Social History Main Topics  . Smoking status: Former Research scientist (life sciences)  . Smokeless tobacco: Never Used  . Alcohol use Yes  . Drug use: Unknown  . Sexual activity: Not on file   Other Topics Concern  . Not on file   Social History Narrative  . No narrative on file    Review of Systems: ROS is O/W negative except as mentioned in HPI.  Physical Exam: Vital signs in last 24 hours: Temp:  [97.4 F (36.3 C)-98.1 F (36.7 C)] 97.4 F (36.3 C) (07/13 0450) Pulse Rate:  [74-79] 74 (07/13 0450) Resp:  [15-20] 20 (07/13 0450) BP: (117-140)/(60-71) 117/60 (07/13 0450) SpO2:  [98 %-100 %] 99 % (07/13 0450) Last BM Date: 07/01/17 General:  Alert, Well-developed, well-nourished, pleasant and cooperative in NAD Head:  Normocephalic and atraumatic. Eyes:  Sclera clear, no icterus.  Conjunctiva pink. Ears:  Normal auditory acuity. Mouth:  No deformity or lesions.   Neck:  Supple; no masses or thyromegaly. Lungs:  Clear throughout to auscultation.  No wheezes, crackles, or rhonchi. Heart:  Regular rate and rhythm; no murmurs, clicks, rubs, or gallops. Abdomen:  Soft,nontender, BS active,nonpalp mass or hsm.   Rectal:  Deferred  Msk:  Symmetrical without gross deformities. . Pulses:  Normal pulses  noted. Extremities:  Without clubbing or edema. Neurologic:  Alert and  oriented x4;  grossly normal neurologically. Skin:  Intact without significant lesions or rashes.. Psych:  Alert and cooperative. Normal mood and affect.  Intake/Output from previous day: 07/12 0701 - 07/13 0700 In: 290 [P.O.:240; IV Piggyback:50] Out: 500 [Urine:500]  Lab Results:  Recent Labs  07/01/17 0958 07/02/17 0254 07/03/17 0355  WBC 20.9* 18.8* 14.9*  HGB 8.0* 7.5* 7.6*  HCT 25.1*  24.0* 24.9*  PLT 328 329 354   BMET  Recent Labs  07/01/17 0958  NA 138  K 3.5  CL 104  CO2 25  GLUCOSE 232*  BUN 12  CREATININE 1.04*  CALCIUM 8.8*   Studies/Results: US Transvaginal Non-ob  Result Date: 07/01/2017 CLINICAL DATA:  Vaginal bleeding EXAM: TRANSABDOMINAL AND TRANSVAGINAL ULTRASOUND OF PELVIS TECHNIQUE: Both transabdominal and transvaginal ultrasound examinations of the pelvis were performed. Transabdominal technique was performed for global imaging of the pelvis including uterus, ovaries, adnexal regions, and pelvic cul-de-sac. It was necessary to proceed with endovaginal exam following the transabdominal exam to visualize the uterus, endometrium, ovaries and adnexa . COMPARISON:  CT 06/30/2017 FINDINGS: Uterus Measurements: 9.7 x 4.8 x 6.5 cm. Heterogeneous echotexture. 2.9 x 1.9 x 1.8 cm anterior intramural fibroid. Endometrium Thickness: 12 mm in thickness.  No focal abnormality visualized. Right ovary Measurements: 2.9 x 1.6 x 2.4 cm. Normal appearance/no adnexal mass. Left ovary Measurements: Not visualized. No adnexal mass seen. Other findings No abnormal free fluid. IMPRESSION: Heterogeneous echotexture throughout the uterus can be seen with adenomyosis. Focal 2.9 cm anterior intramural fibroid. Electronically Signed   By: Rolm Baptise M.D.   On: 07/01/2017 18:07   US Pelvis Complete  Result Date: 07/01/2017 CLINICAL DATA:  Vaginal bleeding EXAM: TRANSABDOMINAL AND TRANSVAGINAL ULTRASOUND OF  PELVIS TECHNIQUE: Both transabdominal and transvaginal ultrasound examinations of the pelvis were performed. Transabdominal technique was performed for global imaging of the pelvis including uterus, ovaries, adnexal regions, and pelvic cul-de-sac. It was necessary to proceed with endovaginal exam following the transabdominal exam to visualize the uterus, endometrium, ovaries and adnexa . COMPARISON:  CT 06/30/2017 FINDINGS: Uterus Measurements: 9.7 x 4.8 x 6.5 cm. Heterogeneous echotexture. 2.9 x 1.9 x 1.8 cm anterior intramural fibroid. Endometrium Thickness: 12 mm in thickness.  No focal abnormality visualized. Right ovary Measurements: 2.9 x 1.6 x 2.4 cm. Normal appearance/no adnexal mass. Left ovary Measurements: Not visualized. No adnexal mass seen. Other findings No abnormal free fluid. IMPRESSION: Heterogeneous echotexture throughout the uterus can be seen with adenomyosis. Focal 2.9 cm anterior intramural fibroid. Electronically Signed   By: Rolm Baptise M.D.   On: 07/01/2017 18:07   US Renal  Result Date: 07/01/2017 CLINICAL DATA:  Hematuria EXAM: RENAL / URINARY TRACT ULTRASOUND COMPLETE COMPARISON:  CT 06/30/2017 FINDINGS: Right Kidney: Length: 13.7 cm. Echogenicity within normal limits. No mass or hydronephrosis visualized. Left Kidney: Length: 13.2 cm. Echogenicity within normal limits. No mass or hydronephrosis visualized. Bladder: Appears normal for degree of bladder distention. IMPRESSION: Unremarkable renal ultrasound. Electronically Signed   By: Rolm Baptise M.D.   On: 07/01/2017 18:06   IMPRESSION:  -Severe symptomatic IDA:  Hgb 5.1 grams on admission, iron studies low.  Given 4 units PRBC's and a dose of Feraheme with increase in Hgb to peak of 8.4 grams and now 7.6 grams today.  Initially hemoccult negative, but now reporting rectal bleeding this AM. -Urosepsis:  On Rocephin with improvement.  Still with some leukocytosis.  PLAN: -Will plan for EGD and colonoscopy on 7/14. -Monitor  Hgb.   ZEHR, JESSICA D.  07/03/2017, 9:35 AM  Pager number [phone]

## 2017-07-04 ENCOUNTER — Other Ambulatory Visit (HOSPITAL_COMMUNITY): Payer: PRIVATE HEALTH INSURANCE

## 2017-07-04 ENCOUNTER — Encounter (HOSPITAL_COMMUNITY): Payer: Self-pay | Admitting: *Deleted

## 2017-07-04 ENCOUNTER — Encounter (HOSPITAL_COMMUNITY)
Admission: EM | Disposition: A | Discharge status: Home / Self Care | Payer: Self-pay | Source: Home / Self Care | Attending: Internal Medicine

## 2017-07-04 DIAGNOSIS — N170 Acute kidney failure with tubular necrosis: Secondary | ICD-10-CM

## 2017-07-04 DIAGNOSIS — N390 Urinary tract infection, site not specified: Secondary | ICD-10-CM

## 2017-07-04 DIAGNOSIS — R6521 Severe sepsis with septic shock: Secondary | ICD-10-CM

## 2017-07-04 DIAGNOSIS — M545 Low back pain: Secondary | ICD-10-CM

## 2017-07-04 DIAGNOSIS — D500 Iron deficiency anemia secondary to blood loss (chronic): Secondary | ICD-10-CM

## 2017-07-04 DIAGNOSIS — R7881 Bacteremia: Secondary | ICD-10-CM

## 2017-07-04 DIAGNOSIS — D269 Other benign neoplasm of uterus, unspecified: Secondary | ICD-10-CM

## 2017-07-04 DIAGNOSIS — D649 Anemia, unspecified: Secondary | ICD-10-CM | POA: Diagnosis present

## 2017-07-04 DIAGNOSIS — K5730 Diverticulosis of large intestine without perforation or abscess without bleeding: Secondary | ICD-10-CM

## 2017-07-04 DIAGNOSIS — K31819 Angiodysplasia of stomach and duodenum without bleeding: Secondary | ICD-10-CM | POA: Diagnosis present

## 2017-07-04 HISTORY — PX: COLONOSCOPY: SHX5424

## 2017-07-04 HISTORY — PX: ESOPHAGOGASTRODUODENOSCOPY: SHX5428

## 2017-07-04 LAB — GLUCOSE, CAPILLARY
GLUCOSE-CAPILLARY: 77 mg/dL (ref 65–99)
Glucose-Capillary: 115 mg/dL — ABNORMAL HIGH (ref 65–99)
Glucose-Capillary: 100 mg/dL — ABNORMAL HIGH (ref 65–99)
Glucose-Capillary: 229 mg/dL — ABNORMAL HIGH (ref 65–99)

## 2017-07-04 LAB — CULTURE, BLOOD (SINGLE): Special Requests: ADEQUATE

## 2017-07-04 LAB — CULTURE, BLOOD (ROUTINE X 2)
Culture: NO GROWTH
SPECIAL REQUESTS: ADEQUATE
Special Requests: ADEQUATE

## 2017-07-04 LAB — CBC
HCT: 25.1 % — ABNORMAL LOW (ref 36.0–46.0)
HEMOGLOBIN: 7.5 g/dL — AB (ref 12.0–15.0)
MCH: 21.4 pg — ABNORMAL LOW (ref 26.0–34.0)
MCHC: 29.9 g/dL — ABNORMAL LOW (ref 30.0–36.0)
MCV: 71.5 fL — AB (ref 78.0–100.0)
PLATELETS: 371 10*3/uL (ref 150–400)
RBC: 3.51 MIL/uL — AB (ref 3.87–5.11)
RDW: 26.6 % — ABNORMAL HIGH (ref 11.5–15.5)
WBC: 15.5 10*3/uL — AB (ref 4.0–10.5)

## 2017-07-04 SURGERY — EGD (ESOPHAGOGASTRODUODENOSCOPY)
Anesthesia: Moderate Sedation

## 2017-07-04 MED ORDER — MIDAZOLAM HCL 5 MG/ML IJ SOLN
INTRAMUSCULAR | Status: AC
  Filled 2017-07-04: qty 3

## 2017-07-04 MED ORDER — ASPIRIN 81 MG PO TABS: 81.0000 mg | ORAL_TABLET | Freq: Every day | ORAL | 0 refills | Status: DC

## 2017-07-04 MED ORDER — METFORMIN HCL 1000 MG PO TABS: 1000.0000 mg | ORAL_TABLET | Freq: Two times a day (BID) | ORAL | 0 refills | Status: AC

## 2017-07-04 MED ORDER — MIDAZOLAM HCL 10 MG/2ML IJ SOLN
INTRAMUSCULAR | Status: DC | PRN
  Administered 2017-07-04 (×3): 1 mg via INTRAVENOUS
  Administered 2017-07-04 (×2): 2 mg via INTRAVENOUS

## 2017-07-04 MED ORDER — ALBUTEROL SULFATE HFA 108 (90 BASE) MCG/ACT IN AERS: 2.0000 | INHALATION_SPRAY | Freq: Four times a day (QID) | RESPIRATORY_TRACT | 0 refills | Status: AC | PRN

## 2017-07-04 MED ORDER — CYANOCOBALAMIN 1000 MCG PO TABS: 1000.0000 ug | ORAL_TABLET | Freq: Every day | ORAL | 0 refills | Status: DC

## 2017-07-04 MED ORDER — FENTANYL CITRATE (PF) 100 MCG/2ML IJ SOLN
INTRAMUSCULAR | Status: DC | PRN
  Administered 2017-07-04 (×4): 25 ug via INTRAVENOUS

## 2017-07-04 MED ORDER — MONTELUKAST SODIUM 10 MG PO TABS: 10.0000 mg | ORAL_TABLET | Freq: Every day | ORAL | 0 refills | Status: DC

## 2017-07-04 MED ORDER — BUTAMBEN-TETRACAINE-BENZOCAINE 2-2-14 % EX AERO
INHALATION_SPRAY | CUTANEOUS | Status: DC | PRN
  Administered 2017-07-04: 2 via TOPICAL

## 2017-07-04 MED ORDER — PANTOPRAZOLE SODIUM 40 MG PO TBEC
40.0000 mg | DELAYED_RELEASE_TABLET | Freq: Every day | ORAL | Status: DC
  Administered 2017-07-04: 40 mg via ORAL
  Filled 2017-07-04: qty 1

## 2017-07-04 MED ORDER — FERROUS SULFATE 325 (65 FE) MG PO TABS: 325.0000 mg | ORAL_TABLET | Freq: Three times a day (TID) | ORAL | 0 refills | Status: AC

## 2017-07-04 MED ORDER — RAMIPRIL 2.5 MG PO CAPS: 2.5000 mg | ORAL_CAPSULE | Freq: Every day | ORAL | 0 refills | Status: DC

## 2017-07-04 MED ORDER — DIPHENHYDRAMINE HCL 50 MG/ML IJ SOLN
INTRAMUSCULAR | Status: AC
  Filled 2017-07-04: qty 1

## 2017-07-04 MED ORDER — CEFUROXIME AXETIL 500 MG PO TABS: 500.0000 mg | ORAL_TABLET | Freq: Two times a day (BID) | ORAL | 0 refills | Status: AC

## 2017-07-04 MED ORDER — FENTANYL CITRATE (PF) 100 MCG/2ML IJ SOLN
INTRAMUSCULAR | Status: AC
  Filled 2017-07-04: qty 4

## 2017-07-04 NOTE — Progress Notes (Signed)
Pt. Transported back from [place] from having MRI, placed on CPAP for h/s on room air, tlerating well, RT to monitor.

## 2017-07-04 NOTE — Op Note (Signed)
[facility] Patient Name: [name] [name] Procedure Date: 07/04/2017 MRN: [id number] Attending MD: Jerene Bears , MD Date of Birth: Apr 16, 1946 CSN: [id number] Age: 71 Admit Type: Inpatient Procedure:                Colonoscopy Indications:              Heme positive stool, Unexplained iron deficiency                            anemia Providers:                Lajuan Lines. Hilarie Fredrickson, MD, Elmer Ramp. Tilden Dome, RN, William Dalton, Technician Referring MD:             [facility] Medicines:                Fentanyl 100 micrograms IV, Midazolam 7 mg IV Complications:            No immediate complications. Estimated Blood Loss:     Estimated blood loss: none. Procedure:                Pre-Anesthesia Assessment:                           - Prior to the procedure, a History and Physical                            was performed, and patient medications and                            allergies were reviewed. The patient's tolerance of                            previous anesthesia was also reviewed. The risks                            and benefits of the procedure and the sedation                            options and risks were discussed with the patient.                            All questions were answered, and informed consent                            was obtained. Prior Anticoagulants: The patient has                            taken no previous anticoagulant or antiplatelet                            agents. ASA Grade Assessment: II - A patient with  mild systemic disease. After reviewing the risks                            and benefits, the patient was deemed in                            satisfactory condition to undergo the procedure.                           After obtaining informed consent, the colonoscope                            was passed under direct vision. Throughout the   procedure, the patient's blood pressure, pulse, and                            oxygen saturations were monitored continuously. The                            EC-[id number] ([id number]) scope was introduced through                            the anus and advanced to the the cecum, identified                            by appendiceal orifice and ileocecal valve. The                            colonoscopy was performed without difficulty. The                            patient tolerated the procedure well. The quality                            of the bowel preparation was good. The ileocecal                            valve, appendiceal orifice, and rectum were                            photographed. Scope In: 1:11:29 PM Scope Out: 1:25:43 PM Scope Withdrawal Time: 0 hours 6 minutes 53 seconds  Total Procedure Duration: 0 hours 14 minutes 14 seconds  Findings:      The perianal exam findings include non-thrombosed external hemorrhoids.      Multiple small and large-mouthed diverticula were found in the sigmoid       colon, descending colon, hepatic flexure and ascending colon. There was       no evidence of diverticular bleeding.      The exam was otherwise without abnormality on direct and retroflexion       views. Impression:               - Non-thrombosed external hemorrhoids found on  perianal exam.                           - Moderate diverticulosis in the sigmoid colon, in                            the descending colon, at the hepatic flexure and in                            the ascending colon. There was no evidence of                            diverticular bleeding.                           - The examination was otherwise normal on direct                            and retroflexion views.                           - No specimens collected. Moderate Sedation:      Moderate (conscious) sedation was administered by the endoscopy nurse       and supervised by  the endoscopist. The following parameters were       monitored: oxygen saturation, heart rate, blood pressure, and response       to care. Total physician intraservice time was 39 minutes. Recommendation:           - Return patient to hospital ward for ongoing care.                           - Resume previous diet.                           - Continue present medications.                           - Repeat colonoscopy in 10 years for screening                            purposes.                           - Will need additional dose of IV iron in about 1                            week (after discharge) given iron deficiency.                           - Follow blood counts and iron studies as an                            outpatient. If persistent iron deficiency anemia                            would  recommend video capsule endoscopy to evaluate                            remaining small bowel (there certainly could be                            additional angiectasias is in the unexamined small                            bowel). Procedure Code(s):        --- Professional ---                           [phone], Colonoscopy, flexible; diagnostic, including                            collection of specimen(s) by brushing or washing,                            when performed (separate procedure)                           99152, Moderate sedation services provided by the                            same physician or other qualified health care                            professional performing the diagnostic or                            therapeutic service that the sedation supports,                            requiring the presence of an independent trained                            observer to assist in the monitoring of the                            patient's level of consciousness and physiological                            status; initial 15 minutes of intraservice time,                             patient age 98 years or older                           [phone], Moderate sedation services; each additional                            15 minutes intraservice time  60045, Moderate sedation services; each additional                            15 minutes intraservice time Diagnosis Code(s):        --- Professional ---                           K64.4, Residual hemorrhoidal skin tags                           R19.5, Other fecal abnormalities                           D50.9, Iron deficiency anemia, unspecified                           K57.30, Diverticulosis of large intestine without                            perforation or abscess without bleeding CPT copyright 2016 American Medical Association. All rights reserved. The codes documented in this report are preliminary and upon coder review may  be revised to meet current compliance requirements. Jerene Bears, MD 07/04/2017 1:51:13 PM This report has been signed electronically. Number of Addenda: 0

## 2017-07-04 NOTE — Progress Notes (Signed)
Patient  back to room,s/p EGD and colonoscopy. Alert and oriented x 3. Vital signs obtained. Denies pain.Will continue to monitor.

## 2017-07-04 NOTE — Discharge Instructions (Signed)
Iron Deficiency Anemia, Adult Iron deficiency anemia is a condition in which the concentration of red blood cells or hemoglobin in the blood is below normal because of too little iron. Hemoglobin is a substance in red blood cells that carries oxygen to the body's tissues. When the concentration of red blood cells or hemoglobin is too low, not enough oxygen reaches these tissues. Iron deficiency anemia is usually long-lasting (chronic) and it develops over time. It may or may not cause symptoms. It is a common type of anemia. What are the causes? This condition may be caused by:  Not enough iron in the diet.  Blood loss caused by bleeding in the intestine.  Blood loss from a gastrointestinal condition like Crohn disease.  Frequent blood draws, such as from blood donation.  Abnormal absorption in the gut.  Heavy menstrual periods in women.  Cancers of the gastrointestinal system, such as colon cancer.  What are the signs or symptoms? Symptoms of this condition may include:  Fatigue.  Headache.  Pale skin, lips, and nail beds.  Poor appetite.  Weakness.  Shortness of breath.  Dizziness.  Cold hands and feet.  Fast or irregular heartbeat.  Irritability. This is more common in severe anemia.  Rapid breathing. This is more common in severe anemia.  Mild anemia may not cause any symptoms. How is this diagnosed? This condition is diagnosed based on:  Your medical history.  A physical exam.  Blood tests.  You may have additional tests to find the underlying cause of your anemia, such as:  Testing for blood in the stool (fecal occult blood test).  A procedure to see inside your colon and rectum (colonoscopy).  A procedure to see inside your esophagus and stomach (endoscopy).  A test in which cells are removed from bone marrow (bone marrow aspiration) or fluid is removed from the bone marrow to be examined (biopsy). This is rarely needed.  How is this  treated? This condition is treated by correcting the cause of your iron deficiency. Treatment may involve:  Adding iron-rich foods to your diet.  Taking iron supplements. If you are pregnant or breastfeeding, you may need to take extra iron because your normal diet usually does not provide the amount of iron that you need.  Increasing vitamin C intake. Vitamin C helps your body absorb iron. Your health care provider may recommend that you take iron supplements along with a glass of orange juice or a vitamin C supplement.  Medicines to make heavy menstrual flow lighter.  Surgery.  You may need repeat blood tests to determine whether treatment is working. Depending on the underlying cause, the anemia should be corrected within 2 months of starting treatment. If the treatment does not seem to be working, you may need more testing. Follow these instructions at home: Medicines  Take over-the-counter and prescription medicines only as told by your health care provider. This includes iron supplements and vitamins.  If you cannot tolerate taking iron supplements by mouth, talk with your health care provider about taking them through a vein (intravenously) or an injection into a muscle.  For the best iron absorption, you should take iron supplements when your stomach is empty. If you cannot tolerate them on an empty stomach, you may need to take them with food.  Do not drink milk or take antacids at the same time as your iron supplements. Milk and antacids may interfere with iron absorption.  Iron supplements can cause constipation. To prevent constipation, include fiber   in your diet as told by your health care provider. A stool softener may also be recommended. Eating and drinking  Talk with your health care provider before changing your diet. He or she may recommend that you eat foods that contain a lot of iron, such as: ? Liver. ? Low-fat (lean) beef. ? Breads and cereals that have iron  added to them (are fortified). ? Eggs. ? Dried fruit. ? Dark green, leafy vegetables.  To help your body use the iron from iron-rich foods, eat those foods at the same time as fresh fruits and vegetables that are high in vitamin C. Foods that are high in vitamin C include: ? Oranges. ? Peppers. ? Tomatoes. ? Mangoes.  Drinkenoughfluid to keep your urine clear or pale yellow. General instructions  Return to your normal activities as told by your health care provider. Ask your health care provider what activities are safe for you.  Practice good hygiene. Anemia can make you more prone to illness and infection.  Keep all follow-up visits as told by your health care provider. This is important. Contact a health care provider if:  You feel nauseous or you vomit.  You feel weak.  You have unexplained sweating.  You develop symptoms of constipation, such as: ? Having fewer than three bowel movements a week. ? Straining to have a bowel movement. ? Having stools that are hard, dry, or larger than normal. ? Feeling full or bloated. ? Pain in the lower abdomen. ? Not feeling relief after having a bowel movement. Get help right away if:  You faint. If this happens, do not drive yourself to the hospital. Call your local emergency services (911 in the [place]  You have chest pain.  You have shortness of breath that: ? Is severe. ? Gets worse with physical activity.  You have a rapid heartbeat.  You become light-headed when getting up from a sitting or lying down position. This information is not intended to replace advice given to you by your health care provider. Make sure you discuss any questions you have with your health care provider. Document Released: 12/05/2000 Document Revised: 08/27/2016 Document Reviewed: 08/27/2016 Elsevier Interactive Patient Education  2018 [employer]  

## 2017-07-04 NOTE — Discharge Summary (Signed)
Physician Discharge Summary  [name] [name] FXT:[id number] DOB: January 14, 1946 DOA: 06/29/2017  PCP: System, Pcp Not In  Admit date: 06/29/2017 Discharge date: 07/04/2017  Admitted From: Home Disposition:  Home  Recommendations for Outpatient Follow-up:  1. Follow up with [facility] on 7/20. Will need another dose of IV iron next week as outpatient. 2. Patient provided information on PCPs in the community including [facility] to establish care. 3. Patient will complete 2 weeks course of antibiotics after 7/22. 4. Patient needs outpatient referral to GYN for uterine adenomyosis.  Home Health:none Equipment/Devices: none  Discharge Condition: Fair CODE STATUS: Full code Diet recommendation: Carb modified   Discharge Diagnoses:  Principal Problem:   Sepsis with septic shock ([place])  Active Problems:   Acute lower UTI   Diabetes mellitus type 2 in obese (HCC)   Iron deficiency anemia due to chronic blood loss   Rectal bleeding   Angiodysplasia of duodenum   Uterine adenomyoma   Symptomatic anemia   Bacteremia, escherichia coli  Internal hemorrhoids   Lumbar back pain   Acute kidney injury  Vitamin B12 deficiency    Brief narrative/history of present illness Please go to admission H&P for details, in brief,71 year old female with history of uncontrolled type 2 diabetes mellitus who is visiting her daughter in [place] presented with Marlou Starks 3 days of dizziness, nausea and poor appetite with generalized weakness. She also reported subjective fever with chills. Denied any dysuria, hematuria or diarrhea. Denied any chest pain reported having persistent productive cough. Patient reported being started on Lasix one month back in [place] for lower extremity edema. Also reported having a cardiac cath without stent placed many years ago.  In the ED she was septic with fever, tachycardia and leukocytosis with WBC of 23 K.. She was also hypotensive and received IV fluid  bolus. Had elevated lactic acid of 2.2 along with acute kidney injury with creatinine of 2.. UA was positive for UTI. Her hemoglobin was found to be 5 and transfuse with 4 unit PRBC.  Hospital course   Principal Problem: Severe iron deficiency anemia. Received 4 unit PRBC and 1 dose of IV iron. Has severe iron deficiency anemia and had one episode of blood mixed stool.. Hemoglobin still 7.5 . Pelvic ultrasound showed  intramural fibroid (adenomyosis). Seen by GI and underwent EGD and colonoscopy.  EGD showed normal esophagus and stomach. A single angioectasia in the duodenum which was treated with APC. Colonoscopy showed nonthrombosed external hemorrhoids, diffuse moderate diverticulosis of the colon without bleeding or inflammation.  Patient tolerated procedure well. GI recommends outpatient follow-up in the office next week. She will need another dose of IV iron as outpatient next week and follow iron panel and H&H closely as outpatient. If has persistent iron deficiency she will need capsule study..  Patient will be discharged on oral iron supplement.  Active problem   Sepsis With septic shock and acute over dysfunction  ([place]) Sepsis Resolved. Leukocytosis improving. -secondary to Escherichia coli UTI with bacteremia. Was on IV Rocephin and antibiotic changed to Ceftin based on sensitivity and plan to treat for total 2 weeks until 7/23. Urine culture growing mixed bacteria.     Active Problems:     Diabetes mellitus type 2 in obese (HCC) A1c of 6.8. Resume metformin and ACE inhibitor    Acute kidney injury ([place]) Secondary to septic shock. Resolved.  Essential hypertension Stable. Resume ACE inhibitor.  ? acute bronchitis Resolved. Treated with empiric antibiotics.  Obstructive sleep  apnea Continue nighttime CPAP  Low back pain New symptom on 7/13. X-ray showed degenerative changes of the lumbar spine. Given persistent pain and MRI of the lumbar spine  with and without contrast was done which showed grade 1 L4-L5 anterolisthesis without spondylosis, no signs of fracture, canal stenosis or discitis. Neural foraminal narrowing of L3 through S1 without cord involvement. Symptoms have now resolved.   Vitamin B12 deficiency Low normal levels. Added supplement.    Code Status : full code  Family Communication  : None at bedside  Disposition Plan  : home    Consults  : [facility]  Procedures  :  CT abdomen and pelvis MRI of the lumbar spine EGD and colonoscopy  Discharge Instructions   Allergies as of 07/04/2017   No Known Allergies     Medication List    TAKE these medications   albuterol 108 (90 Base) MCG/ACT inhaler Commonly known as:  PROVENTIL HFA;VENTOLIN HFA Inhale 2 puffs into the lungs every 6 (six) hours as needed for wheezing or shortness of breath.   aspirin 81 MG tablet Take 81 mg by mouth daily.   cefUROXime 500 MG tablet Commonly known as:  CEFTIN Take 1 tablet (500 mg total) by mouth 2 (two) times daily with a meal.   cyanocobalamin 1000 MCG tablet Take 1 tablet (1,000 mcg total) by mouth daily.   ferrous sulfate 325 (65 FE) MG tablet Take 1 tablet (325 mg total) by mouth 3 (three) times daily with meals.   metFORMIN 1000 MG tablet Commonly known as:  GLUCOPHAGE Take 1,000 mg by mouth 2 (two) times daily with a meal.   montelukast 10 MG tablet Commonly known as:  SINGULAIR Take 10 mg by mouth daily.   ramipril 2.5 MG capsule Commonly known as:  ALTACE Take 2.5 mg by mouth daily.      Follow-up Information    Levin Erp, PA Follow up on 07/10/2017.   Specialty:  Gastroenterology Why:  1:15 pm Contact information: [address] [address] [phone]          No Known Allergies      Procedures/Studies: Ct Abdomen Pelvis Wo Contrast  Result Date: 06/30/2017 CLINICAL DATA:  Anemia.  Vomiting.  Renal failure. EXAM: CT ABDOMEN AND PELVIS  WITHOUT CONTRAST TECHNIQUE: Multidetector CT imaging of the abdomen and pelvis was performed following the standard protocol without IV contrast. COMPARISON:  Right upper quadrant ultrasound yesterday. FINDINGS: Lower chest: Small bilateral pleural effusions, right greater than left. Associated compressive atelectasis in the right lower lobe. Hepatobiliary: Mild decreased hepatic density suggesting steatosis. No evidence of focal lesion allowing for lack contrast. Gallbladder is decompressed. No calcified gallstone. No biliary dilatation. Pancreas: No pancreatic ductal dilatation or surrounding inflammatory changes. Lack of IV contrast limits detailed assessment. Spleen: Normal in size without focal abnormality. Small splenule inferiorly. Adrenals/Urinary Tract: No adrenal nodule. Mild perinephric edema about both kidneys. Fullness of both renal collecting systems without frank hydronephrosis. Ureters are not well-defined. No urolithiasis. Urinary bladder is physiologically distended. No definite bladder wall thickening. Stomach/Bowel: Stomach physiologically distended with ingested contents. No bowel obstruction with enteric contrast throughout the colon. Appendix filled without contrast and normal. There is no bowel dilatation, evidence of wall thickening or inflammation. Occasional colonic diverticula in the sigmoid colon. No diverticulitis. Vascular/Lymphatic: Mild aortic atherosclerosis without aneurysm. Probable small retroperitoneal lymph nodes, for example left periaortic node measures 8 mm at the level of the knees renal vein. Retroperitoneal structures  not well-defined without IV contrast. No evidence pelvic adenopathy. Reproductive: Uterus and bilateral adnexa are unremarkable. Other: Laxity of the anterior abdominal wall left small fat containing umbilical hernia. No free air, free fluid, or intra-abdominal fluid collection. Musculoskeletal: Degenerative change in the lumbar spine and the pubic  symphysis. Mild degenerative change of both hips. There are no acute or suspicious osseous abnormalities. IMPRESSION: 1. Mild perinephric stranding about both kidneys, nonspecific, can be seen with urinary tract infection or chronic renal disease. 2. Trace pleural effusions, right greater than left. 3. Mild hepatic steatosis.  Mild aortic atherosclerosis. 4. Occasional colonic diverticulosis without diverticulitis. Electronically Signed   By: Jeb Levering M.D.   On: 06/30/2017 16:24   Dg Chest 2 View  Result Date: 06/30/2017 CLINICAL DATA:  71 year old female cough. Prior smoker. Subsequent encounter. EXAM: CHEST  2 VIEW COMPARISON:  06/29/2017 and 08/02/2007. FINDINGS: Small right-sided pleural effusion. Mild pulmonary vascular prominence. No segmental consolidation or pneumothorax. No plain film evidence of pulmonary malignancy. Calcified slightly tortuous aorta. Heart size within normal limits. Shoulder joint degenerative changes. Mild degenerative changes thoracic spine without compression fracture. IMPRESSION: Small right-sided pleural effusion. Mild pulmonary vascular prominence. Aortic atherosclerosis. Electronically Signed   By: Genia Del M.D.   On: 06/30/2017 08:45   Dg Lumbar Spine Complete  Result Date: 07/03/2017 CLINICAL DATA:  Pain across lower back started last night; no injury EXAM: LUMBAR SPINE - COMPLETE 4+ VIEW COMPARISON:  Plain film of the lumbar spine dated 11/28/2004. FINDINGS: Alignment is normal. No fracture line or displaced fracture fragment seen. No acute or suspicious osseous lesion. No evidence of pars interarticularis defect. Upper sacrum appears intact and normally aligned. Degenerative facet hypertrophy at the L3 through S1 levels and mild disc desiccations within the lower lumbar spine. Visualized paravertebral soft tissues are unremarkable. IMPRESSION: 1. No acute findings. 2. Degenerative changes of the mid and lower lumbar spine, mild to moderate in degree. Most  prominent degenerative change noted amongst the posterior facets of the lower lumbar spine, with associated osseous hypertrophy, which can result in nerve root impingement. If any radiculopathic symptoms, would consider nonemergent lumbar spine MRI for further characterization. Electronically Signed   By: Franki Cabot M.D.   On: 07/03/2017 11:26   Ct Head Wo Contrast  Result Date: 06/29/2017 CLINICAL DATA:  71 year old female with dizziness and fatigue. History of diabetes. EXAM: CT HEAD WITHOUT CONTRAST TECHNIQUE: Contiguous axial images were obtained from the base of the skull through the vertex without intravenous contrast. COMPARISON:  None. FINDINGS: Brain: There is mild age-related atrophy and chronic microvascular ischemic changes. No acute intracranial hemorrhage. No mass effect or midline shift. No extra-axial fluid collection. Vascular: No hyperdense vessel or unexpected calcification. Skull: Normal. Negative for fracture or focal lesion. Sinuses/Orbits: No acute finding. Bilateral exophthalmos. Clinical correlation is recommended. There is mild thickened appearance of the ocular muscles which may represent thyroid opthalmopathy. Clinical correlation is recommended. Other: None IMPRESSION: 1. No acute intracranial pathology. 2. Mild age-related atrophy and chronic microvascular ischemic changes. 3. Bilateral exophthalmos may be related to underlying thyroid disease. Electronically Signed   By: Anner Crete M.D.   On: 06/29/2017 05:48   Mr Lumbar Spine W Wo Contrast  Result Date: 07/03/2017 CLINICAL DATA:  Low back pain. History of uncontrolled diabetes, anemia. EXAM: MRI LUMBAR SPINE WITHOUT AND WITH CONTRAST TECHNIQUE: Multiplanar and multiecho pulse sequences of the lumbar spine were obtained without and with intravenous contrast. CONTRAST:  20 cc MultiHance MULTIHANCE GADOBENATE DIMEGLUMINE 529 MG/ML  IV SOLN COMPARISON:  Lumbar spine radiographs July 03, 2017 and CT lumbar spine June 30, 2017 FINDINGS: SEGMENTATION: For the purposes of this report, the last well-formed intervertebral disc will be described as L5-S1. ALIGNMENT: Maintenance of the lumbar lordosis. Minimal grade 1 L3-4 anterolisthesis without spondylolysis. VERTEBRAE:Vertebral bodies are intact. Moderate to severe L5-S1 disc height loss with acute on chronic moderate discogenic endplate changes. Remaining disc morphology is preserved with mild desiccation. No suspicious bone marrow signal. No abnormal osseous or disc enhancement. Generalized decreased bone marrow signal consistent with history of anemia and red bone marrow. CONUS MEDULLARIS: Conus medullaris terminates at L1-2 and demonstrates normal morphology and signal characteristics. Cauda equina is normal. No abnormal cord, leptomeningeal or epidural enhancement. PARASPINAL AND SOFT TISSUES: Moderate symmetric paraspinal muscle denervation. DISC LEVELS: L1-2 No disc bulge, canal stenosis nor neural foraminal narrowing. L2-3: No disc bulge, canal stenosis nor neural foraminal narrowing. Mild facet arthropathy. L3-4: Small broad-based disc bulge, moderate facet arthropathy ligamentum flavum redundancy without canal stenosis. Minimal neural foraminal narrowing. L4-5: Anterolisthesis. Small broad-based disc bulge. Severe RIGHT and moderate LEFT facet arthropathy and ligamentum flavum redundancy. No canal stenosis. Mild LEFT neural foraminal narrowing. L5-S1: Moderate broad-based disc bulge, enhancing annular fissure. Mild to moderate facet arthropathy without canal stenosis. Moderate neural foraminal narrowing. IMPRESSION: 1. Grade 1 L4-5 anterolisthesis without spondylolysis. No suspicious enhancement to suggest infection. No fracture. 2. No canal stenosis. Neural foraminal narrowing L3-4 through L5-S1: Moderate at L5-S1. 3. Generalized decreased bone marrow signal compatible with patient's history of anemia. Electronically Signed   By: Elon Alas M.D.   On: 07/03/2017 22:22    US Abdomen Complete  Result Date: 06/29/2017 CLINICAL DATA:  Initial evaluation for acute dizziness. EXAM: ABDOMEN ULTRASOUND COMPLETE COMPARISON:  Prior radiograph from earlier same day. FINDINGS: Gallbladder: No gallstones or wall thickening visualized. A positive sonographic Murphy sign was elicited on exam. Common bile duct: Diameter: 2.2 mm Liver: No focal lesion identified. Within normal limits in parenchymal echogenicity. IVC: No abnormality visualized. Pancreas: Visualized portion unremarkable. Spleen: Size and appearance within normal limits. Right Kidney: Length: 12.9 cm. Echogenicity within normal limits. No mass or hydronephrosis visualized. Left Kidney: Length: 10.9 cm. Echogenicity within normal limits. No mass or hydronephrosis visualized. Abdominal aorta: No aneurysm visualized. Other findings: None. IMPRESSION: 1. Positive sonographic Murphy's sign without cholelithiasis or other sonographic features to suggest acute cholecystitis. No biliary dilatation. 2. Otherwise unremarkable abdominal ultrasound. Electronically Signed   By: Jeannine Boga M.D.   On: 06/29/2017 06:50   US Transvaginal Non-ob  Result Date: 07/01/2017 CLINICAL DATA:  Vaginal bleeding EXAM: TRANSABDOMINAL AND TRANSVAGINAL ULTRASOUND OF PELVIS TECHNIQUE: Both transabdominal and transvaginal ultrasound examinations of the pelvis were performed. Transabdominal technique was performed for global imaging of the pelvis including uterus, ovaries, adnexal regions, and pelvic cul-de-sac. It was necessary to proceed with endovaginal exam following the transabdominal exam to visualize the uterus, endometrium, ovaries and adnexa . COMPARISON:  CT 06/30/2017 FINDINGS: Uterus Measurements: 9.7 x 4.8 x 6.5 cm. Heterogeneous echotexture. 2.9 x 1.9 x 1.8 cm anterior intramural fibroid. Endometrium Thickness: 12 mm in thickness.  No focal abnormality visualized. Right ovary Measurements: 2.9 x 1.6 x 2.4 cm. Normal appearance/no  adnexal mass. Left ovary Measurements: Not visualized. No adnexal mass seen. Other findings No abnormal free fluid. IMPRESSION: Heterogeneous echotexture throughout the uterus can be seen with adenomyosis. Focal 2.9 cm anterior intramural fibroid. Electronically Signed   By: Rolm Baptise M.D.   On: 07/01/2017 18:07  US Pelvis Complete  Result Date: 07/01/2017 CLINICAL DATA:  Vaginal bleeding EXAM: TRANSABDOMINAL AND TRANSVAGINAL ULTRASOUND OF PELVIS TECHNIQUE: Both transabdominal and transvaginal ultrasound examinations of the pelvis were performed. Transabdominal technique was performed for global imaging of the pelvis including uterus, ovaries, adnexal regions, and pelvic cul-de-sac. It was necessary to proceed with endovaginal exam following the transabdominal exam to visualize the uterus, endometrium, ovaries and adnexa . COMPARISON:  CT 06/30/2017 FINDINGS: Uterus Measurements: 9.7 x 4.8 x 6.5 cm. Heterogeneous echotexture. 2.9 x 1.9 x 1.8 cm anterior intramural fibroid. Endometrium Thickness: 12 mm in thickness.  No focal abnormality visualized. Right ovary Measurements: 2.9 x 1.6 x 2.4 cm. Normal appearance/no adnexal mass. Left ovary Measurements: Not visualized. No adnexal mass seen. Other findings No abnormal free fluid. IMPRESSION: Heterogeneous echotexture throughout the uterus can be seen with adenomyosis. Focal 2.9 cm anterior intramural fibroid. Electronically Signed   By: Rolm Baptise M.D.   On: 07/01/2017 18:07   US Renal  Result Date: 07/01/2017 CLINICAL DATA:  Hematuria EXAM: RENAL / URINARY TRACT ULTRASOUND COMPLETE COMPARISON:  CT 06/30/2017 FINDINGS: Right Kidney: Length: 13.7 cm. Echogenicity within normal limits. No mass or hydronephrosis visualized. Left Kidney: Length: 13.2 cm. Echogenicity within normal limits. No mass or hydronephrosis visualized. Bladder: Appears normal for degree of bladder distention. IMPRESSION: Unremarkable renal ultrasound. Electronically Signed   By: Rolm Baptise M.D.   On: 07/01/2017 18:06   Dg Abd Acute W/chest  Result Date: 06/29/2017 CLINICAL DATA:  71 year old female with weakness and mid abdominal pain. EXAM: DG ABDOMEN ACUTE W/ 1V CHEST COMPARISON:  Chest radiograph dated 08/02/2007 FINDINGS: Mild diffuse interstitial coarsening primarily involving the lung bases. No focal consolidation, pleural effusion, or pneumothorax. The cardiac silhouette is within normal limits. There degenerative changes of the shoulders. No acute osseous pathology. No bowel dilatation or evidence of obstruction. No free air or radiopaque calculi. The osseous structures and soft tissues appear unremarkable. IMPRESSION: Negative abdominal radiographs.  No acute cardiopulmonary disease. Electronically Signed   By: Anner Crete M.D.   On: 06/29/2017 01:51       Subjective: 72 better last night. Denies further low back pain.  Discharge Exam: Vitals:   07/04/17 1355 07/04/17 1416  BP: 139/76 129/79  Pulse: (!) 38 64  Resp: 20 18  Temp:  97.8 F (36.6 C)   Vitals:   07/04/17 1345 07/04/17 1350 07/04/17 1355 07/04/17 1416  BP: 138/67 (!) 133/99 139/76 129/79  Pulse: 65 (!) 48 (!) 38 64  Resp: 20 20 20 18   Temp:    97.8 F (36.6 C)  TempSrc:    Oral  SpO2: 100% 100% 100% 96%  Weight:      Height:        Gen.: Elderly female not in distress HEENT: Pallor present, moist mucosa, supple neck Chest: Clear bilaterally, no added sounds CVS: Normal S1 and S2, no murmurs GI: Soft, nondistended, nontender Musculoskeletal: Warm, no edema, resolved low back tenderness.     The results of significant diagnostics from this hospitalization (including imaging, microbiology, ancillary and laboratory) are listed below for reference.     Microbiology: Recent Results (from the past 240 hour(s))  Culture, blood (single)     Status: Abnormal   Collection Time: 06/29/17 12:38 AM  Result Value Ref Range Status   Specimen Description BLOOD RIGHT ANTECUBITAL   Final   Special Requests   Final    BOTTLES DRAWN AEROBIC AND ANAEROBIC Blood Culture adequate volume   Culture  Setup Time   Final    GRAM NEGATIVE RODS ANAEROBIC BOTTLE ONLY CRITICAL RESULT CALLED TO, READ BACK BY AND VERIFIED WITHGuadlupe Spanish PHARMD 2100 06/30/17 A BROWNING Performed at [facility], [place] [address], [place], [place] [address]    Culture ESCHERICHIA COLI (A)  Final   Report Status 07/04/2017 FINAL  Final   Organism ID, Bacteria ESCHERICHIA COLI  Final      Susceptibility   Escherichia coli - MIC*    AMPICILLIN >=32 RESISTANT Resistant     CEFAZOLIN <=4 SENSITIVE Sensitive     CEFEPIME <=1 SENSITIVE Sensitive     CEFTAZIDIME <=1 SENSITIVE Sensitive     CEFTRIAXONE <=1 SENSITIVE Sensitive     CIPROFLOXACIN <=0.25 SENSITIVE Sensitive     GENTAMICIN <=1 SENSITIVE Sensitive     IMIPENEM <=0.25 SENSITIVE Sensitive     TRIMETH/SULFA >=320 RESISTANT Resistant     AMPICILLIN/SULBACTAM >=32 RESISTANT Resistant     PIP/TAZO <=4 SENSITIVE Sensitive     Extended ESBL NEGATIVE Sensitive     * ESCHERICHIA COLI  Blood Culture ID Panel (Reflexed)     Status: Abnormal   Collection Time: 06/29/17 12:38 AM  Result Value Ref Range Status   Enterococcus species NOT DETECTED NOT DETECTED Final   Listeria monocytogenes NOT DETECTED NOT DETECTED Final   Staphylococcus species NOT DETECTED NOT DETECTED Final   Staphylococcus aureus NOT DETECTED NOT DETECTED Final   Streptococcus species NOT DETECTED NOT DETECTED Final   Streptococcus agalactiae NOT DETECTED NOT DETECTED Final   Streptococcus pneumoniae NOT DETECTED NOT DETECTED Final   Streptococcus pyogenes NOT DETECTED NOT DETECTED Final   Acinetobacter baumannii NOT DETECTED NOT DETECTED Final   Enterobacteriaceae species DETECTED (A) NOT DETECTED Final    Comment: Enterobacteriaceae represent a large family of gram-negative bacteria, not a single organism. CRITICAL RESULT CALLED TO, READ BACK BY AND VERIFIED WITH: N  GLOGOVAC PHARMD 2100 06/30/17 A BROWNING    Enterobacter cloacae complex NOT DETECTED NOT DETECTED Final   Escherichia coli DETECTED (A) NOT DETECTED Final    Comment: CRITICAL RESULT CALLED TO, READ BACK BY AND VERIFIED WITH: N GLOGOVAC PHARMD 2100 06/30/17 A BROWNING    Klebsiella oxytoca NOT DETECTED NOT DETECTED Final   Klebsiella pneumoniae NOT DETECTED NOT DETECTED Final   Proteus species NOT DETECTED NOT DETECTED Final   Serratia marcescens NOT DETECTED NOT DETECTED Final   Carbapenem resistance NOT DETECTED NOT DETECTED Final   Haemophilus influenzae NOT DETECTED NOT DETECTED Final   Neisseria meningitidis NOT DETECTED NOT DETECTED Final   Pseudomonas aeruginosa NOT DETECTED NOT DETECTED Final   Candida albicans NOT DETECTED NOT DETECTED Final   Candida glabrata NOT DETECTED NOT DETECTED Final   Candida krusei NOT DETECTED NOT DETECTED Final   Candida parapsilosis NOT DETECTED NOT DETECTED Final   Candida tropicalis NOT DETECTED NOT DETECTED Final    Comment: Performed at [facility], [place] [address] [place], [place] [address]  Blood culture (routine x 2)     Status: Abnormal   Collection Time: 06/29/17  2:25 AM  Result Value Ref Range Status   Specimen Description BLOOD LEFT ANTECUBITAL  Final   Special Requests   Final    BOTTLES DRAWN AEROBIC AND ANAEROBIC Blood Culture adequate volume   Culture  Setup Time   Final    GRAM NEGATIVE RODS AEROBIC BOTTLE ONLY CRITICAL RESULT CALLED TO, READ BACK BY AND VERIFIED WITH: J GADHIA AT 0865 ON [id number] BY SJW  Culture (A)  Final    ESCHERICHIA COLI SUSCEPTIBILITIES PERFORMED ON PREVIOUS CULTURE WITHIN THE LAST 5 DAYS. Performed at [facility], [place] [address], [place], [place] [address]    Report Status 07/04/2017 FINAL  Final  Urine Culture     Status: Abnormal   Collection Time: 06/29/17  2:58 AM  Result Value Ref Range Status   Specimen Description URINE, CLEAN CATCH  Final   Special Requests NONE  Final    Culture MULTIPLE ORGANISMS PRESENT, NONE PREDOMINANT (A)  Final   Report Status 06/30/2017 FINAL  Final  Blood culture (routine x 2)     Status: None   Collection Time: 06/29/17  7:30 AM  Result Value Ref Range Status   Specimen Description BLOOD BLOOD RIGHT HAND  Final   Special Requests   Final    BOTTLES DRAWN AEROBIC AND ANAEROBIC Blood Culture adequate volume   Culture   Final    NO GROWTH 5 DAYS Performed at [facility], [place] [address], [place],  [address]    Report Status 07/04/2017 FINAL  Final  MRSA PCR Screening     Status: None   Collection Time: 06/29/17 12:11 PM  Result Value Ref Range Status   MRSA by PCR NEGATIVE NEGATIVE Final    Comment:        The GeneXpert MRSA Assay (FDA approved for NASAL specimens only), is one component of a comprehensive MRSA colonization surveillance program. It is not intended to diagnose MRSA infection nor to guide or monitor treatment for MRSA infections.      Labs: BNP (last 3 results) No results for input(s): BNP in the last 8760 hours. Basic Metabolic Panel:  Recent Labs Lab 06/29/17 0040 06/30/17 0359 07/01/17 0958  NA 126* 134* 138  K 4.2 4.1 3.5  CL 94* 102 104  CO2 22 22 25   GLUCOSE 329* 260* 232*  BUN 21* 16 12  CREATININE 2.05* 1.34* 1.04*  CALCIUM 8.2* 8.2* 8.8*   Liver Function Tests:  Recent Labs Lab 06/29/17 1710  AST 34  ALT 20  ALKPHOS 58  BILITOT 0.7  PROT 6.2*  ALBUMIN 2.8*    Recent Labs Lab 06/29/17 0040  LIPASE 26   No results for input(s): AMMONIA in the last 168 hours. CBC:  Recent Labs Lab 06/29/17 0040  06/30/17 0908 07/01/17 0958 07/02/17 0254 07/03/17 0355 07/04/17 0417  WBC 23.6*  < > 21.9* 20.9* 18.8* 14.9* 15.5*  NEUTROABS 20.1*  --   --   --   --   --   --   HGB 5.1*  < > 8.4* 8.0* 7.5* 7.6* 7.5*  HCT 17.5*  < > 26.1* 25.1* 24.0* 24.9* 25.1*  MCV 60.6*  < > 67.4* 67.5* 69.2* 70.7* 71.5*  PLT 348  < > 294 328 329 354 371  < > = values in this  interval not displayed. Cardiac Enzymes: No results for input(s): CKTOTAL, CKMB, CKMBINDEX, TROPONINI in the last 168 hours. BNP: Invalid input(s): POCBNP CBG:  Recent Labs Lab 07/03/17 1144 07/03/17 1634 07/04/17 0029 07/04/17 0820 07/04/17 1203  GLUCAP 148* 209* 77 115* 100*   D-Dimer No results for input(s): DDIMER in the last 72 hours. Hgb A1c No results for input(s): HGBA1C in the last 72 hours. Lipid Profile No results for input(s): CHOL, HDL, LDLCALC, TRIG, CHOLHDL, LDLDIRECT in the last 72 hours. Thyroid function studies No results for input(s): TSH, T4TOTAL, T3FREE, THYROIDAB in the last 72 hours.  Invalid  input(s): FREET3 Anemia work up No results for input(s): VITAMINB12, FOLATE, FERRITIN, TIBC, IRON, RETICCTPCT in the last 72 hours. Urinalysis    Component Value Date/Time   COLORURINE YELLOW 06/29/2017 0255   APPEARANCEUR CLOUDY (A) 06/29/2017 0255   LABSPEC 1.006 06/29/2017 0255   PHURINE 5.0 06/29/2017 0255   GLUCOSEU 50 (A) 06/29/2017 0255   HGBUR MODERATE (A) 06/29/2017 0255   BILIRUBINUR NEGATIVE 06/29/2017 0255   KETONESUR NEGATIVE 06/29/2017 0255   PROTEINUR 30 (A) 06/29/2017 0255   NITRITE NEGATIVE 06/29/2017 0255   LEUKOCYTESUR LARGE (A) 06/29/2017 0255   Sepsis Labs Invalid input(s): PROCALCITONIN,  WBC,  LACTICIDVEN Microbiology Recent Results (from the past 240 hour(s))  Culture, blood (single)     Status: Abnormal   Collection Time: 06/29/17 12:38 AM  Result Value Ref Range Status   Specimen Description BLOOD RIGHT ANTECUBITAL  Final   Special Requests   Final    BOTTLES DRAWN AEROBIC AND ANAEROBIC Blood Culture adequate volume   Culture  Setup Time   Final    GRAM NEGATIVE RODS ANAEROBIC BOTTLE ONLY CRITICAL RESULT CALLED TO, READ BACK BY AND VERIFIED WITHGuadlupe Spanish PHARMD 2100 06/30/17 A BROWNING Performed at [facility], [place] [address] [place], [place] [address]    Culture ESCHERICHIA COLI (A)  Final   Report Status  07/04/2017 FINAL  Final   Organism ID, Bacteria ESCHERICHIA COLI  Final      Susceptibility   Escherichia coli - MIC*    AMPICILLIN >=32 RESISTANT Resistant     CEFAZOLIN <=4 SENSITIVE Sensitive     CEFEPIME <=1 SENSITIVE Sensitive     CEFTAZIDIME <=1 SENSITIVE Sensitive     CEFTRIAXONE <=1 SENSITIVE Sensitive     CIPROFLOXACIN <=0.25 SENSITIVE Sensitive     GENTAMICIN <=1 SENSITIVE Sensitive     IMIPENEM <=0.25 SENSITIVE Sensitive     TRIMETH/SULFA >=320 RESISTANT Resistant     AMPICILLIN/SULBACTAM >=32 RESISTANT Resistant     PIP/TAZO <=4 SENSITIVE Sensitive     Extended ESBL NEGATIVE Sensitive     * ESCHERICHIA COLI  Blood Culture ID Panel (Reflexed)     Status: Abnormal   Collection Time: 06/29/17 12:38 AM  Result Value Ref Range Status   Enterococcus species NOT DETECTED NOT DETECTED Final   Listeria monocytogenes NOT DETECTED NOT DETECTED Final   Staphylococcus species NOT DETECTED NOT DETECTED Final   Staphylococcus aureus NOT DETECTED NOT DETECTED Final   Streptococcus species NOT DETECTED NOT DETECTED Final   Streptococcus agalactiae NOT DETECTED NOT DETECTED Final   Streptococcus pneumoniae NOT DETECTED NOT DETECTED Final   Streptococcus pyogenes NOT DETECTED NOT DETECTED Final   Acinetobacter baumannii NOT DETECTED NOT DETECTED Final   Enterobacteriaceae species DETECTED (A) NOT DETECTED Final    Comment: Enterobacteriaceae represent a large family of gram-negative bacteria, not a single organism. CRITICAL RESULT CALLED TO, READ BACK BY AND VERIFIED WITH: N GLOGOVAC PHARMD 2100 06/30/17 A BROWNING    Enterobacter cloacae complex NOT DETECTED NOT DETECTED Final   Escherichia coli DETECTED (A) NOT DETECTED Final    Comment: CRITICAL RESULT CALLED TO, READ BACK BY AND VERIFIED WITH: N GLOGOVAC PHARMD 2100 06/30/17 A BROWNING    Klebsiella oxytoca NOT DETECTED NOT DETECTED Final   Klebsiella pneumoniae NOT DETECTED NOT DETECTED Final   Proteus species NOT DETECTED NOT  DETECTED Final   Serratia marcescens NOT DETECTED NOT DETECTED Final   Carbapenem resistance NOT DETECTED NOT DETECTED Final   Haemophilus influenzae NOT DETECTED NOT  DETECTED Final   Neisseria meningitidis NOT DETECTED NOT DETECTED Final   Pseudomonas aeruginosa NOT DETECTED NOT DETECTED Final   Candida albicans NOT DETECTED NOT DETECTED Final   Candida glabrata NOT DETECTED NOT DETECTED Final   Candida krusei NOT DETECTED NOT DETECTED Final   Candida parapsilosis NOT DETECTED NOT DETECTED Final   Candida tropicalis NOT DETECTED NOT DETECTED Final    Comment: Performed at [facility], [place] [address], [place], [place] [address]  Blood culture (routine x 2)     Status: Abnormal   Collection Time: 06/29/17  2:25 AM  Result Value Ref Range Status   Specimen Description BLOOD LEFT ANTECUBITAL  Final   Special Requests   Final    BOTTLES DRAWN AEROBIC AND ANAEROBIC Blood Culture adequate volume   Culture  Setup Time   Final    GRAM NEGATIVE RODS AEROBIC BOTTLE ONLY CRITICAL RESULT CALLED TO, READ BACK BY AND VERIFIED WITH: J GADHIA AT 0750 ON [id number] BY SJW    Culture (A)  Final    ESCHERICHIA COLI SUSCEPTIBILITIES PERFORMED ON PREVIOUS CULTURE WITHIN THE LAST 5 DAYS. Performed at [facility], [place] [address], [place], [place] [address]    Report Status 07/04/2017 FINAL  Final  Urine Culture     Status: Abnormal   Collection Time: 06/29/17  2:58 AM  Result Value Ref Range Status   Specimen Description URINE, CLEAN CATCH  Final   Special Requests NONE  Final   Culture MULTIPLE ORGANISMS PRESENT, NONE PREDOMINANT (A)  Final   Report Status 06/30/2017 FINAL  Final  Blood culture (routine x 2)     Status: None   Collection Time: 06/29/17  7:30 AM  Result Value Ref Range Status   Specimen Description BLOOD BLOOD RIGHT HAND  Final   Special Requests   Final    BOTTLES DRAWN AEROBIC AND ANAEROBIC Blood Culture adequate volume   Culture   Final    NO GROWTH 5 DAYS Performed  at [facility], [place] [address] [place],  [address]    Report Status 07/04/2017 FINAL  Final  MRSA PCR Screening     Status: None   Collection Time: 06/29/17 12:11 PM  Result Value Ref Range Status   MRSA by PCR NEGATIVE NEGATIVE Final    Comment:        The GeneXpert MRSA Assay (FDA approved for NASAL specimens only), is one component of a comprehensive MRSA colonization surveillance program. It is not intended to diagnose MRSA infection nor to guide or monitor treatment for MRSA infections.      Time coordinating discharge: Over 30 minutes  SIGNED:   Louellen Molder, MD  [facility] 07/04/2017, 3:06 PM Pager   If 7PM-7AM, please contact night-coverage [email or web address] Password [email or web address]

## 2017-07-04 NOTE — Op Note (Signed)
[facility] Patient Name: [name] [name] Procedure Date: 07/04/2017 MRN: [id number] Attending MD: Jerene Bears , MD Date of Birth: 1946/03/08 CSN: [id number] Age: 71 Admit Type: Inpatient Procedure:                Upper GI endoscopy Indications:              Suspected upper gastrointestinal bleeding in                            patient with unexplained iron deficiency anemia,                            Heme positive stool Providers:                Lajuan Lines. Hilarie Fredrickson, MD, Tory Emerald, RN, William Dalton, Technician Referring MD:             [facility] Medicines:                Fentanyl 100 micrograms IV, Midazolam 7 mg IV,                            Cetacaine spray Complications:            No immediate complications. Estimated Blood Loss:     Estimated blood loss was minimal. Procedure:                Pre-Anesthesia Assessment:                           - Prior to the procedure, a History and Physical                            was performed, and patient medications and                            allergies were reviewed. The patient's tolerance of                            previous anesthesia was also reviewed. The risks                            and benefits of the procedure and the sedation                            options and risks were discussed with the patient.                            All questions were answered, and informed consent                            was obtained. Prior Anticoagulants: The patient has  taken no previous anticoagulant or antiplatelet                            agents. ASA Grade Assessment: II - A patient with                            mild systemic disease. After reviewing the risks                            and benefits, the patient was deemed in                            satisfactory condition to undergo the procedure.                           After obtaining  informed consent, the endoscope was                            passed under direct vision. Throughout the                            procedure, the patient's blood pressure, pulse, and                            oxygen saturations were monitored continuously. The                            [id number]) scope was introduced through the                            mouth, and advanced to the second part of duodenum.                            The upper GI endoscopy was accomplished without                            difficulty. The patient tolerated the procedure                            well. Scope In: Scope Out: Findings:      The examined esophagus was normal.      The entire examined stomach was normal. Biopsies were taken with a cold       forceps for histology and Helicobacter pylori testing given iron       deficiency anemia.      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 Fulguration to ablate the lesion to prevent bleeding by       argon plasma at 0.5 liters/minute and 20 watts was successful.      The exam of the duodenum was otherwise normal. Impression:               - Normal esophagus.                           -  Normal stomach. Biopsied.                           - A single angioectasia in the duodenum. Treated                            with argon plasma coagulation (APC). Moderate Sedation:      Moderate (conscious) sedation was administered by the endoscopy nurse       and supervised by the endoscopist. The following parameters were       monitored: oxygen saturation, heart rate, blood pressure, and response       to care. Total physician intraservice time was 39 minutes. Recommendation:           - Return patient to hospital ward for ongoing care.                           - Resume previous diet.                           - Continue present  medications.                           - Await pathology results.                           - See the other procedure note for documentation of                            additional recommendations. Procedure Code(s):        --- Professional ---                           [phone], 60, Esophagogastroduodenoscopy, flexible,                            transoral; with control of bleeding, any method                           43239, Esophagogastroduodenoscopy, flexible,                            transoral; with biopsy, single or multiple                           99152, Moderate sedation services provided by the                            same physician or other qualified health care                            professional performing the diagnostic or                            therapeutic service that the sedation supports,  requiring the presence of an independent trained                            observer to assist in the monitoring of the                            patient's level of consciousness and physiological                            status; initial 15 minutes of intraservice time,                            patient age 75 years or older                           [phone], Moderate sedation services; each additional                            15 minutes intraservice time                           [phone], Moderate sedation services; each additional                            15 minutes intraservice time Diagnosis Code(s):        --- Professional ---                           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 All rights reserved. The codes documented in this report are preliminary and upon coder review may  be revised to meet  current compliance requirements. Jerene Bears, MD 07/04/2017 1:46:17 PM This report has been signed electronically. Number of Addenda: 0

## 2017-07-04 NOTE — Progress Notes (Signed)
Patient d/c home. Stable. 

## 2017-07-04 NOTE — Progress Notes (Addendum)
Patient transported to endo.  

## 2017-07-04 NOTE — Progress Notes (Signed)
Discharge instructions given, verbalized understanding,teach back utilized. Follow -up appointment discussed with patient. Multiple prescriptions given. All questions answered appropriately. This nurse texted Dr. Clementeen Graham to send cyanocobalamin prescription at [facility]. Patient waiting for daughter to transport her home.

## 2017-07-06 ENCOUNTER — Encounter (HOSPITAL_COMMUNITY): Payer: Self-pay | Admitting: Internal Medicine

## 2017-07-08 ENCOUNTER — Other Ambulatory Visit: Payer: Self-pay

## 2017-07-08 ENCOUNTER — Telehealth: Payer: Self-pay

## 2017-07-08 ENCOUNTER — Encounter: Payer: Self-pay | Admitting: Internal Medicine

## 2017-07-08 DIAGNOSIS — D509 Iron deficiency anemia, unspecified: Secondary | ICD-10-CM

## 2017-07-08 NOTE — Telephone Encounter (Signed)
Pt scheduled for another dose of Feraheme at [facility] short stay 07/15/17@12noon , orders in epic.

## 2017-07-10 ENCOUNTER — Ambulatory Visit: Payer: PRIVATE HEALTH INSURANCE | Admitting: Physician Assistant

## 2017-07-15 ENCOUNTER — Ambulatory Visit (HOSPITAL_COMMUNITY)
Admission: RE | Admit: 2017-07-15 | Discharge: 2017-07-15 | Disposition: A | Discharge status: Home / Self Care | Payer: Medicare (Managed Care) | Source: Ambulatory Visit | Attending: Internal Medicine | Admitting: Internal Medicine

## 2017-07-15 ENCOUNTER — Encounter (HOSPITAL_COMMUNITY): Payer: Self-pay

## 2017-07-15 DIAGNOSIS — D509 Iron deficiency anemia, unspecified: Secondary | ICD-10-CM | POA: Insufficient documentation

## 2017-07-15 HISTORY — DX: Chronic obstructive pulmonary disease, unspecified: J44.9

## 2017-07-15 HISTORY — DX: Pneumonia, unspecified organism: J18.9

## 2017-07-15 HISTORY — DX: Unspecified asthma, uncomplicated: J45.909

## 2017-07-15 HISTORY — DX: Sleep apnea, unspecified: G47.30

## 2017-07-15 HISTORY — DX: Urinary tract infection, site not specified: N39.0

## 2017-07-15 HISTORY — DX: Anemia, unspecified: D64.9

## 2017-07-15 HISTORY — DX: Headache, unspecified: R51.9

## 2017-07-15 HISTORY — DX: Unspecified osteoarthritis, unspecified site: M19.90

## 2017-07-15 HISTORY — DX: Headache: R51

## 2017-07-15 MED ORDER — SODIUM CHLORIDE 0.9 % IV SOLN
Freq: Once | INTRAVENOUS | Status: AC
  Administered 2017-07-15: 13:00:00 via INTRAVENOUS

## 2017-07-15 MED ORDER — SODIUM CHLORIDE 0.9 % IV SOLN
510.0000 mg | Freq: Once | INTRAVENOUS | Status: AC
  Administered 2017-07-15: 510 mg via INTRAVENOUS
  Filled 2017-07-15: qty 17

## 2017-07-15 NOTE — Discharge Instructions (Signed)
Ferumoxytol injection / infusion (feraheme) What is this medicine? FERUMOXYTOL is an iron complex. Iron is used to make healthy red blood cells, which carry oxygen and nutrients throughout the body. This medicine is used to treat iron deficiency anemia in people with chronic kidney disease. This medicine may be used for other purposes; ask your health care provider or pharmacist if you have questions. COMMON BRAND NAME(S): Feraheme What should I tell my health care provider before I take this medicine? They need to know if you have any of these conditions: -anemia not caused by low iron levels -high levels of iron in the blood -magnetic resonance imaging (MRI) test scheduled -an unusual or allergic reaction to iron, other medicines, foods, dyes, or preservatives -pregnant or trying to get pregnant -breast-feeding How should I use this medicine? This medicine is for injection into a vein. It is given by a health care professional in a hospital or clinic setting. Talk to your pediatrician regarding the use of this medicine in children. Special care may be needed. Overdosage: If you think you have taken too much of this medicine contact a poison control center or emergency room at once. NOTE: This medicine is only for you. Do not share this medicine with others. What if I miss a dose? It is important not to miss your dose. Call your doctor or health care professional if you are unable to keep an appointment. What may interact with this medicine? This medicine may interact with the following medications: -other iron products This list may not describe all possible interactions. Give your health care provider a list of all the medicines, herbs, non-prescription drugs, or dietary supplements you use. Also tell them if you smoke, drink alcohol, or use illegal drugs. Some items may interact with your medicine. What should I watch for while using this medicine? Visit your doctor or healthcare  professional regularly. Tell your doctor or healthcare professional if your symptoms do not start to get better or if they get worse. You may need blood work done while you are taking this medicine. You may need to follow a special diet. Talk to your doctor. Foods that contain iron include: whole grains/cereals, dried fruits, beans, or peas, leafy green vegetables, and organ meats (liver, kidney). What side effects may I notice from receiving this medicine? Side effects that you should report to your doctor or health care professional as soon as possible: -allergic reactions like skin rash, itching or hives, swelling of the face, lips, or tongue -breathing problems -changes in blood pressure -feeling faint or lightheaded, falls -fever or chills -flushing, sweating, or hot feelings -swelling of the ankles or feet Side effects that usually do not require medical attention (report to your doctor or health care professional if they continue or are bothersome): -diarrhea -headache -nausea, vomiting -stomach pain This list may not describe all possible side effects. Call your doctor for medical advice about side effects. You may report side effects to FDA at 1-800-FDA-1088. Where should I keep my medicine? This drug is given in a hospital or clinic and will not be stored at home. NOTE: This sheet is a summary. It may not cover all possible information. If you have questions about this medicine, talk to your doctor, pharmacist, or health care provider.  2018 Elsevier/Gold Standard (2016-01-10 12:41:49)

## 2017-09-02 ENCOUNTER — Other Ambulatory Visit (INDEPENDENT_AMBULATORY_CARE_PROVIDER_SITE_OTHER): Payer: Medicare HMO

## 2017-09-02 DIAGNOSIS — D509 Iron deficiency anemia, unspecified: Secondary | ICD-10-CM | POA: Diagnosis not present

## 2017-09-02 LAB — CBC WITH DIFFERENTIAL/PLATELET
BASOS ABS: 0.1 10*3/uL (ref 0.0–0.1)
BASOS PCT: 0.7 % (ref 0.0–3.0)
Eosinophils Absolute: 0.4 10*3/uL (ref 0.0–0.7)
Eosinophils Relative: 4.2 % (ref 0.0–5.0)
HCT: 39.8 % (ref 36.0–46.0)
Hemoglobin: 12.9 g/dL (ref 12.0–15.0)
LYMPHS ABS: 2.8 10*3/uL (ref 0.7–4.0)
Lymphocytes Relative: 29.5 % (ref 12.0–46.0)
MCHC: 32.3 g/dL (ref 30.0–36.0)
MCV: 87.6 fl (ref 78.0–100.0)
MONOS PCT: 8.7 % (ref 3.0–12.0)
Monocytes Absolute: 0.8 10*3/uL (ref 0.1–1.0)
NEUTROS ABS: 5.5 10*3/uL (ref 1.4–7.7)
NEUTROS PCT: 56.9 % (ref 43.0–77.0)
PLATELETS: 227 10*3/uL (ref 150.0–400.0)
RBC: 4.55 Mil/uL (ref 3.87–5.11)
RDW: 23.9 % — AB (ref 11.5–15.5)
WBC: 9.6 10*3/uL (ref 4.0–10.5)

## 2017-09-02 LAB — IBC PANEL
Iron: 119 ug/dL (ref 42–145)
Saturation Ratios: 32.8 % (ref 20.0–50.0)
TRANSFERRIN: 259 mg/dL (ref 212.0–360.0)

## 2017-09-02 LAB — FERRITIN: Ferritin: 180.2 ng/mL (ref 10.0–291.0)

## 2017-09-03 ENCOUNTER — Other Ambulatory Visit: Payer: Self-pay

## 2017-09-03 DIAGNOSIS — D509 Iron deficiency anemia, unspecified: Secondary | ICD-10-CM

## 2017-09-10 DIAGNOSIS — E538 Deficiency of other specified B group vitamins: Secondary | ICD-10-CM | POA: Insufficient documentation

## 2017-09-11 DIAGNOSIS — J4520 Mild intermittent asthma, uncomplicated: Secondary | ICD-10-CM | POA: Insufficient documentation

## 2017-12-10 DIAGNOSIS — G479 Sleep disorder, unspecified: Secondary | ICD-10-CM | POA: Insufficient documentation

## 2017-12-28 ENCOUNTER — Encounter: Payer: Self-pay | Admitting: Pulmonary Disease

## 2017-12-28 ENCOUNTER — Ambulatory Visit: Payer: Medicare Other | Admitting: Pulmonary Disease

## 2017-12-28 VITALS — BP 128/82 | HR 71 | Ht 66.0 in | Wt 178.0 lb

## 2017-12-28 DIAGNOSIS — R29818 Other symptoms and signs involving the nervous system: Secondary | ICD-10-CM | POA: Diagnosis not present

## 2017-12-28 NOTE — Progress Notes (Signed)
[facility]  Chief Complaint  Patient presents with  . sleep consult    Pt referred by Dr. Precious Haws MD. Pt snores loudly each night, stops breathing often while sleeping.     Vital signs: BP 128/82 (BP Location: Left Arm, Cuff Size: Normal)   Pulse 71   Ht 5\' 6"  (1.676 m)   Wt 178 lb (80.7 kg)   SpO2 99%   BMI 28.73 kg/m   History of Present Illness: [name] [name] is a 72 y.o. female for evaluation of sleep problems.  She has been told that she snores and stops breathing while asleep.  This has been going on for years.  She was in hospital last year.  She was told she needed to get assessment for sleep apnea.  She can't sleep on her back.  She wakes up frequently during the night.  She goes to sleep at 10 pm.  She falls asleep in about 30 minutes.  She wakes up several times to use the bathroom.  She gets out of bed at 6 am.  She feels tired in the morning.  She denies morning headache.  She does not use anything to help her stay awake.  She was given a script for belsomra recently.  She denies sleep walking, sleep talking, bruxism, or nightmares.  There is no history of restless legs.  She denies sleep hallucinations, sleep paralysis, or cataplexy.  The Epworth score is 22 out of 24.     Physical Exam:  General - pleasant Eyes - pupils reactive ENT - no sinus tenderness, no oral exudate, no LAN, MP 4 Cardiac - regular, no murmur Chest - no wheeze, rales Abd - soft, non tender Ext - no edema Skin - no rashes Neuro - normal strength Psych - normal mood  Discussion: She has snoring, sleep disruption, apnea, and daytime sleepiness.  She has history of diabetes.  I am concerned she could have sleep apnea.  We discussed how sleep apnea can affect various health problems, including risks for hypertension, cardiovascular disease, and diabetes.  We also discussed how sleep disruption can increase risks for accidents, such as while  driving.  Weight loss as a means of improving sleep apnea was also reviewed.  Additional treatment options discussed were CPAP therapy, oral appliance, and surgical intervention.  Assessment/Plan:  Suspected sleep apnea. - will arrange for home sleep study   Patient Instructions  Will arrange for home sleep study Will call to arrange for follow up after sleep study reviewed    Chesley Mires, MD [place] 12/28/2017, 2:42 PM Pager:  [phone]  Flow Sheet  Pulmonary tests:  Sleep tests:  Cardiac tests:  Events:  Review of Systems:  Constitutional: Negative for fever and unexpected weight change.  HENT: Negative for congestion, dental problem, ear pain, nosebleeds, postnasal drip, rhinorrhea, sinus pressure, sneezing, sore throat and trouble swallowing.   Eyes: Positive for pain, redness and itching.  Respiratory: Positive for chest tightness and shortness of breath. Negative for cough and wheezing.   Cardiovascular: Positive for leg swelling. Negative for palpitations.  Gastrointestinal: Negative for nausea and vomiting.  Genitourinary: Negative for dysuria.  Musculoskeletal: Positive for joint swelling.  Skin: Negative for rash.  Allergic/Immunologic: Positive for environmental allergies. Negative for food allergies and immunocompromised state.  Neurological: Positive for weakness and headaches.  Hematological: Does not bruise/bleed easily.  Psychiatric/Behavioral: Negative for dysphoric mood. The patient is not nervous/anxious.    Past Medical History:  She  has a past medical history of Anemia, Arthritis, Asthma, COPD (chronic obstructive pulmonary disease) ([place]), Diabetes mellitus without complication ([place]), Headache, Pneumonia, Sleep apnea, and UTI (urinary tract infection) (2018).  Past Surgical History: She  has a past surgical history that includes Cesarean section; Esophagogastroduodenoscopy (N/A, 07/04/2017); and Colonoscopy (N/A,  07/04/2017).  Family History: Her family history includes COPD in her mother.  Social History: She  reports that she has been smoking cigarettes.  She has been smoking about 0.25 packs per day. she has never used smokeless tobacco. She reports that she drinks about 0.6 oz of alcohol per week. She reports that she does not use drugs.  Medications: Allergies as of 12/28/2017   No Known Allergies     Medication List        Accurate as of 12/28/17  2:42 PM. Always use your most recent med list.          albuterol 108 (90 Base) MCG/ACT inhaler Commonly known as:  PROVENTIL HFA;VENTOLIN HFA Inhale 2 puffs into the lungs every 6 (six) hours as needed for wheezing or shortness of breath.   aspirin 81 MG tablet Take 1 tablet (81 mg total) by mouth daily.   ferrous sulfate 325 (65 FE) MG tablet Take 1 tablet (325 mg total) by mouth 3 (three) times daily with meals.   metFORMIN 1000 MG tablet Commonly known as:  GLUCOPHAGE Take 1 tablet (1,000 mg total) by mouth 2 (two) times daily with a meal.   montelukast 10 MG tablet Commonly known as:  SINGULAIR Take 1 tablet (10 mg total) by mouth daily.   ramipril 2.5 MG capsule Commonly known as:  ALTACE Take 1 capsule (2.5 mg total) by mouth daily.

## 2017-12-28 NOTE — Progress Notes (Signed)
   Subjective:    Patient ID: [name] [name], female    DOB: [birth year], 72 y.o.   MRN: [id number]  HPI    Review of Systems  Constitutional: Negative for fever and unexpected weight change.  HENT: Negative for congestion, dental problem, ear pain, nosebleeds, postnasal drip, rhinorrhea, sinus pressure, sneezing, sore throat and trouble swallowing.   Eyes: Positive for pain, redness and itching.  Respiratory: Positive for chest tightness and shortness of breath. Negative for cough and wheezing.   Cardiovascular: Positive for leg swelling. Negative for palpitations.  Gastrointestinal: Negative for nausea and vomiting.  Genitourinary: Negative for dysuria.  Musculoskeletal: Positive for joint swelling.  Skin: Negative for rash.  Allergic/Immunologic: Positive for environmental allergies. Negative for food allergies and immunocompromised state.  Neurological: Positive for weakness and headaches.  Hematological: Does not bruise/bleed easily.  Psychiatric/Behavioral: Negative for dysphoric mood. The patient is not nervous/anxious.        Objective:   Physical Exam        Assessment & Plan:

## 2017-12-28 NOTE — Patient Instructions (Signed)
Will arrange for home sleep study Will call to arrange for follow up after sleep study reviewed  

## 2018-01-04 ENCOUNTER — Telehealth: Payer: Self-pay | Admitting: Pulmonary Disease

## 2018-01-04 NOTE — Telephone Encounter (Signed)
I called pt to schedule her home sleep test

## 2018-01-04 NOTE — Telephone Encounter (Signed)
Spoke with pt. She is not sure who called her, she does not know how to work her phone. There is no documentation that any clinical staff member called her.  [facility] - did you guys happen to call her about her home sleep study?

## 2018-01-12 ENCOUNTER — Emergency Department (HOSPITAL_COMMUNITY): Payer: Medicare Other

## 2018-01-12 ENCOUNTER — Emergency Department (HOSPITAL_COMMUNITY)
Admission: EM | Admit: 2018-01-12 | Discharge: 2018-01-12 | Disposition: A | Discharge status: Home / Self Care | Payer: Medicare Other | Attending: Family Medicine | Admitting: Family Medicine

## 2018-01-12 ENCOUNTER — Encounter (HOSPITAL_COMMUNITY): Payer: Self-pay | Admitting: Emergency Medicine

## 2018-01-12 DIAGNOSIS — J45909 Unspecified asthma, uncomplicated: Secondary | ICD-10-CM | POA: Insufficient documentation

## 2018-01-12 DIAGNOSIS — J449 Chronic obstructive pulmonary disease, unspecified: Secondary | ICD-10-CM | POA: Insufficient documentation

## 2018-01-12 DIAGNOSIS — I10 Essential (primary) hypertension: Secondary | ICD-10-CM | POA: Insufficient documentation

## 2018-01-12 DIAGNOSIS — M25562 Pain in left knee: Secondary | ICD-10-CM

## 2018-01-12 DIAGNOSIS — E119 Type 2 diabetes mellitus without complications: Secondary | ICD-10-CM | POA: Diagnosis not present

## 2018-01-12 DIAGNOSIS — Z7984 Long term (current) use of oral hypoglycemic drugs: Secondary | ICD-10-CM | POA: Insufficient documentation

## 2018-01-12 DIAGNOSIS — Z79899 Other long term (current) drug therapy: Secondary | ICD-10-CM | POA: Diagnosis not present

## 2018-01-12 DIAGNOSIS — F17210 Nicotine dependence, cigarettes, uncomplicated: Secondary | ICD-10-CM | POA: Insufficient documentation

## 2018-01-12 DIAGNOSIS — G4733 Obstructive sleep apnea (adult) (pediatric): Secondary | ICD-10-CM | POA: Diagnosis not present

## 2018-01-12 DIAGNOSIS — W19XXXA Unspecified fall, initial encounter: Secondary | ICD-10-CM

## 2018-01-12 DIAGNOSIS — X509XXA Other and unspecified overexertion or strenuous movements or postures, initial encounter: Secondary | ICD-10-CM | POA: Insufficient documentation

## 2018-01-12 HISTORY — DX: Essential (primary) hypertension: I10

## 2018-01-12 MED ORDER — DICLOFENAC SODIUM 1 % TD GEL: 2.0000 g | Freq: Four times a day (QID) | TRANSDERMAL | 0 refills | Status: AC

## 2018-01-12 NOTE — ED Provider Notes (Signed)
[facility] DEPT Provider Note   CSN: [id number] Arrival date & time: 01/12/18  1431     History   Chief Complaint Chief Complaint  Patient presents with  . Fall  . Knee Pain    HPI [name] [name] is a 72 y.o. female. With a hx of COPD, DM, and HTN who presents to the ED complaining of  Acute worsening of ongoing L knee pain and swelling s/p mechanical fall yesterday. States she was standing on her bed attempting to turn on her fan, misstepped causing her knee to give and her to fall.  States that she fell onto her right side, no head injury or loss of consciousness.  She is not having any pain in any other area other than the left knee which will radiate up to the upper leg and buttocks area at times, this has been occurring for several weeks. Rates pain a 5/10 in severity, worse with weight bearing and with movement. Has tried 800 mg ibuprofen with minimal relief.  In December she had a squatting twisting motion that prompted a popping sensation in the left knee.  Since then she has had multiple problems with the knee giving out as well as pain and swelling.  She was seen by her primary care doctor for this, do not receive medications at her appointment. Denies neck pain, back, pain, numbness, weakness, fever or chills.  No Dizziness, lightheadedness, chest pain, shortness of breath prior to fall or at present. HPI  Past Medical History:  Diagnosis Date  . Anemia   . Arthritis   . Asthma   . COPD (chronic obstructive pulmonary disease) ([place])   . Diabetes mellitus without complication ([place])   . Headache   . Hypertension   . Pneumonia   . Sleep apnea   . UTI (urinary tract infection) 2018    Patient Active Problem List   Diagnosis Date Noted  . 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 Symptomatic anemia 07/04/2017  . Bacteremia, escherichia coli 07/04/2017  . Angiodysplasia of duodenum   . Iron deficiency anemia due to chronic blood loss   . Rectal  bleeding   . Sepsis ([place]) 06/29/2017  . Acute lower UTI 06/29/2017  . Diabetes mellitus type 2 in obese ([place]) 06/29/2017  . Acute cystitis without hematuria   . TOBACCO DEPENDENCE 02/18/2007  . RHINITIS, ALLERGIC 02/18/2007  . ASTHMA, UNSPECIFIED 02/18/2007  . BACK PAIN, LOW 02/18/2007  . LEG PAIN OR KNEE PAIN 02/18/2007    Past Surgical History:  Procedure Laterality Date  . CESAREAN SECTION    . COLONOSCOPY N/A 07/04/2017   Procedure: COLONOSCOPY;  Surgeon: Jerene Bears, MD;  Location: [facility];  Service: Gastroenterology;  Laterality: N/A;  . ESOPHAGOGASTRODUODENOSCOPY N/A 07/04/2017   Procedure: ESOPHAGOGASTRODUODENOSCOPY (EGD);  Surgeon: Jerene Bears, MD;  Location: [facility];  Service: Gastroenterology;  Laterality: N/A;    OB History    No data available       Home Medications    Prior to Admission medications   Medication Sig Start Date End Date Taking? Authorizing Provider  albuterol (PROVENTIL HFA;VENTOLIN HFA) 108 (90 Base) MCG/ACT inhaler Inhale 2 puffs into the lungs every 6 (six) hours as needed for wheezing or shortness of breath. 07/04/17  Yes Dhungel, Nishant, MD  ferrous sulfate 325 (65 FE) MG tablet Take 1 tablet (325 mg total) by mouth 3 (three) times daily with meals. Patient taking differently: Take 325 mg by mouth daily.  07/04/17  Yes Dhungel,  Nishant, MD  metFORMIN (GLUCOPHAGE) 1000 MG tablet Take 1 tablet (1,000 mg total) by mouth 2 (two) times daily with a meal. 07/04/17  Yes Dhungel, Nishant, MD  montelukast (SINGULAIR) 10 MG tablet Take 1 tablet (10 mg total) by mouth daily. 07/04/17  Yes Dhungel, Nishant, MD  ramipril (ALTACE) 2.5 MG capsule Take 1 capsule (2.5 mg total) by mouth daily. 07/04/17  Yes Dhungel, Nishant, MD  aspirin 81 MG tablet Take 1 tablet (81 mg total) by mouth daily. Patient not taking: Reported on 01/12/2018 07/04/17   Louellen Molder, MD    Family History Family History  Problem Relation Age of Onset  . COPD Mother   .  CAD Neg Hx   . Diabetes Mellitus II Neg Hx     Social History Social History   Tobacco Use  . Smoking status: Current Some Day Smoker    Packs/day: 0.25    Types: Cigarettes  . Smokeless tobacco: Never Used  Substance Use Topics  . Alcohol use: Yes    Alcohol/week: 0.6 oz    Types: 1 Glasses of wine per week  . Drug use: No     Allergies   Patient has no known allergies.   Review of Systems Review of Systems  Constitutional: Negative for chills and fever.  Respiratory: Negative for shortness of breath.   Cardiovascular: Negative for chest pain.  Gastrointestinal: Negative for vomiting.  Musculoskeletal: Positive for arthralgias (L knee) and joint swelling (L knee). Negative for back pain and neck pain.  Neurological: Negative for dizziness, weakness, light-headedness and numbness.   Physical Exam Updated Vital Signs BP 136/72 (BP Location: Left Arm)   Pulse 82   Temp (!) 97.3 F (36.3 C) (Oral)   Resp 16   SpO2 98%   Physical Exam  Constitutional: She appears well-developed and well-nourished.  Non-toxic appearance. No distress.  HENT:  Head: Normocephalic and atraumatic.  Eyes: Conjunctivae are normal. Right eye exhibits no discharge. Left eye exhibits no discharge.  Neck: Normal range of motion. Neck supple. No spinous process tenderness present.  Cardiovascular: Normal rate and regular rhythm.  No murmur heard. Pulmonary/Chest: Breath sounds normal. No respiratory distress. She has no wheezes. She has no rales.  Abdominal: Soft. She exhibits no distension. There is no tenderness.  Musculoskeletal:  Back: No obvious deformity, appreciable swelling, ecchymosis, or erythema.  No midline tenderness or paraspinal muscle tenderness  Upper extremities: No obvious deformity, ecchymosis, erythema, or appreciable swelling.  Patient has full range of motion of all joints.  No bony tenderness. Lower extremities: There is no obvious deformity, erythema, ecchymosis, or  overlying warmth.  There is some appreciable swelling to the left knee diffusely.  Patient has full range of motion to bilateral hips and ankles.  Range of motion to the left knee is minimally limited secondary to pain, patient is able to flex past 90 degrees he is able to fully extend.  Patient with diffuse tenderness to the knee most significantly to the medial and lateral aspects.  Ligaments are intact.  No other bony tenderness.  Neurological: She is alert.  Clear speech.  Sensation intact to bilateral upper and lower extremities.  5 out of 5 strength with knee flexion and extension as well as ankle plantar and dorsiflexion bilaterally.  Gait is antalgic  Skin: Skin is warm and dry. No rash noted.  Psychiatric: She has a normal mood and affect. Her behavior is normal.  Nursing note and vitals reviewed.    ED Treatments /  Results  Labs (all labs ordered are listed, but only abnormal results are displayed) Labs Reviewed - No data to display  EKG  EKG Interpretation None       Radiology Dg Knee Complete 4 Views Left  Result Date: 01/12/2018 CLINICAL DATA:  Two recent falls.  Medial left knee pain. EXAM: LEFT KNEE - COMPLETE 4+ VIEW COMPARISON:  01/28/2006 FINDINGS: No evidence for fracture or dislocation. There is a small to moderate suprapatellar joint effusion. Spurring along the patellofemoral compartment and medial knee compartment. Chondrocalcinosis in the lateral knee compartment. Spurring along the posterior tibial plateau. IMPRESSION: Osteoarthritic changes with a small to moderate-sized joint effusion. No acute bone abnormality. Electronically Signed   By: Markus Daft M.D.   On: 01/12/2018 16:35    Procedures Procedures (including critical care time)  Medications Ordered in ED Medications - No data to display   Initial Impression / Assessment and Plan / ED Course  I have reviewed the triage vital signs and the nursing notes.  Pertinent labs & imaging results that were  available during my care of the patient were reviewed by me and considered in my medical decision making (see chart for details).  Patient presents to the emergency department status post mechanical fall yesterday complaining of acute worsening of her left knee pain which has been ongoing for the past month.  Patient is nontoxic-appearing, in no apparent distress.  Patient without signs of serious head, neck, or back injury following the fall. [place] CT head injury/trauma rule and C-spine rule suggest no imaging required. Patient has no focal neurologic deficits or midline spinal tenderness to palpation, doubt fracture or dislocation of the spine, doubt head bleed.  Left knee is swollen and diffusely tender to palpation on exam.  She has mildly restricted range of motion secondary to pain- unable to perform full flexion, she can however flex slightly past 90 degrees.  X-ray consistent with osteoarthritis as well as a small to moderate-sized joint effusion, no acute fracture or dislocation.  She is neurovascularly intact distally. patient is without systemic symptoms, erythema, or redness of the joint consistent with gout or septic joint.  Patient placed in a knee sleeve in the emergency department. Upon review patient's previous creatinines have been elevated in the past, most recent on chart review was 1.04  Six months ago.   Will treat patient with Voltaren gel as opposed to PO NSAIDs, recommended supplementing with Tylenol.  Will have patient follow-up with orthopedics in 1 week. I discussed results, treatment plan, need for ortho follow-up, and return precautions with the patient. Provided opportunity for questions, patient confirmed understanding and is in agreement with plan.     Final Clinical Impressions(s) / ED Diagnoses   Final diagnoses:  Fall, initial encounter  Left knee pain, unspecified chronicity    ED Discharge Orders        Ordered    diclofenac sodium (VOLTAREN) 1 % GEL  4 times  daily     01/12/18 1700       Genavive Kubicki, [place], PA-C 01/12/18 1710    Sherwood Gambler, MD 01/13/18 1014

## 2018-01-12 NOTE — ED Triage Notes (Signed)
Patient reports falling 2 times since December. C/o left knee pain that radiates to her buttock. Pain is worse with movement and weight bearing.

## 2018-01-12 NOTE — ED Notes (Signed)
Ortho tech aware of orders.

## 2018-01-12 NOTE — Discharge Instructions (Signed)
Please read and follow all provided instructions.  You have been seen today for a fall that resulted in worsening of your left knee pain.  Tests performed today include: An x-ray of the affected area - does NOT show any broken bones or dislocations.  The x-ray report is below   Narrative:   CLINICAL DATA:  Two recent falls.  Medial left knee pain.  EXAM: LEFT KNEE - COMPLETE 4+ VIEW  COMPARISON:  01/28/2006  FINDINGS: No evidence for fracture or dislocation. There is a small to moderate suprapatellar joint effusion. Spurring along the patellofemoral compartment and medial knee compartment. Chondrocalcinosis in the lateral knee compartment. Spurring along the posterior tibial plateau.  IMPRESSION: Osteoarthritic changes with a small to moderate-sized joint effusion.  No acute bone abnormality.   Electronically Signed   By: Markus Daft M.D.   On: 01/12/2018 16:35     Vital signs. See below for your results today.   Home care instructions: -- *PRICE in the first 24-48 hours after injury: Protect (with brace, splint, sling), if given by your provider Rest Ice- Do not apply ice pack directly to your skin, place towel or similar between your skin and ice/ice pack. Apply ice for 20 min, then remove for 40 min while awake Compression- Wear brace, elastic bandage, splint as directed by your provider Elevate affected extremity above the level of your heart when not walking around for the first 24-48 hours   I have given you a prescription for diclofenac gel.  This is a nonsteroidal anti-inflammatory medicine that you apply topically to your knee.  You may apply 2 g up to 4 times per day.  Apply this to the area of the knee.  You may also supplement with Tylenol.  Follow-up instructions: Please follow-up with your primary care provider or the provided orthopedic physician (bone specialist) divided in your discharge instructions in 1 week.  Return instructions:  Please return if  your toes or feet are numb or tingling, appear gray or blue, or you have severe pain (also elevate the leg and loosen splint or wrap if you were given one) Please return to the Emergency Department if you experience worsening symptoms.  Please return if you have any other emergent concerns. Additional Information:  Your vital signs today were: BP 136/72 (BP Location: Left Arm)    Pulse 82    Temp (!) 97.3 F (36.3 C) (Oral)    Resp 16    SpO2 98%  If your blood pressure (BP) was elevated above 135/85 this visit, please have this repeated by your doctor within one month. ---------------

## 2018-01-30 ENCOUNTER — Telehealth: Payer: Self-pay | Admitting: Pulmonary Disease

## 2018-01-30 ENCOUNTER — Encounter: Payer: Self-pay | Admitting: Pulmonary Disease

## 2018-01-30 DIAGNOSIS — G4733 Obstructive sleep apnea (adult) (pediatric): Secondary | ICD-10-CM

## 2018-01-30 HISTORY — DX: Obstructive sleep apnea (adult) (pediatric): G47.33

## 2018-01-30 NOTE — Telephone Encounter (Signed)
HST 01/12/18 >> AHI 22.1, SaO2 low 71%.  Will have my nurse inform pt that sleep study shows severe sleep apnea.  Options are 1) CPAP now, 2) ROV first.  If She is agreeable to CPAP, then please send order for auto CPAP range 5 to 15 cm H2O with heated humidity and mask of choice.  Have download sent 1 month after starting CPAP and set up ROV 2 months after starting CPAP.  ROV can be with me or NP.

## 2018-02-01 NOTE — Telephone Encounter (Signed)
Spoke with pt. She is aware of results. Pt would prefer to start CPAP therapy. Order has been placed. 2 month ROV has been scheduled for 04/02/2018 at 11am. Nothing further was needed.

## 2018-02-01 NOTE — Addendum Note (Signed)
Addended by: Desmond Dike C on: 02/01/2018 03:05 PM   Modules accepted: Orders

## 2018-02-04 ENCOUNTER — Other Ambulatory Visit: Payer: Self-pay | Admitting: *Deleted

## 2018-02-04 DIAGNOSIS — R29818 Other symptoms and signs involving the nervous system: Secondary | ICD-10-CM

## 2018-02-09 DIAGNOSIS — M1712 Unilateral primary osteoarthritis, left knee: Secondary | ICD-10-CM | POA: Insufficient documentation

## 2018-04-02 ENCOUNTER — Ambulatory Visit: Payer: Medicare Other | Admitting: Pulmonary Disease

## 2018-06-07 DIAGNOSIS — H903 Sensorineural hearing loss, bilateral: Secondary | ICD-10-CM | POA: Insufficient documentation

## 2018-08-26 DIAGNOSIS — N950 Postmenopausal bleeding: Secondary | ICD-10-CM | POA: Insufficient documentation

## 2018-09-07 DIAGNOSIS — Z8619 Personal history of other infectious and parasitic diseases: Secondary | ICD-10-CM | POA: Insufficient documentation

## 2019-01-19 ENCOUNTER — Other Ambulatory Visit: Payer: Self-pay

## 2019-01-19 ENCOUNTER — Emergency Department (HOSPITAL_COMMUNITY)
Admission: EM | Admit: 2019-01-19 | Discharge: 2019-01-19 | Disposition: A | Discharge status: Home / Self Care | Payer: Medicare HMO | Attending: Emergency Medicine | Admitting: Emergency Medicine

## 2019-01-19 ENCOUNTER — Encounter (HOSPITAL_COMMUNITY): Payer: Self-pay | Admitting: Emergency Medicine

## 2019-01-19 DIAGNOSIS — R739 Hyperglycemia, unspecified: Secondary | ICD-10-CM | POA: Insufficient documentation

## 2019-01-19 DIAGNOSIS — Z7984 Long term (current) use of oral hypoglycemic drugs: Secondary | ICD-10-CM | POA: Diagnosis not present

## 2019-01-19 DIAGNOSIS — R51 Headache: Secondary | ICD-10-CM | POA: Insufficient documentation

## 2019-01-19 DIAGNOSIS — R030 Elevated blood-pressure reading, without diagnosis of hypertension: Secondary | ICD-10-CM | POA: Insufficient documentation

## 2019-01-19 DIAGNOSIS — Z5321 Procedure and treatment not carried out due to patient leaving prior to being seen by health care provider: Secondary | ICD-10-CM | POA: Diagnosis not present

## 2019-01-19 LAB — BASIC METABOLIC PANEL
ANION GAP: 9 (ref 5–15)
BUN: 10 mg/dL (ref 8–23)
CHLORIDE: 105 mmol/L (ref 98–111)
CO2: 26 mmol/L (ref 22–32)
Calcium: 9.5 mg/dL (ref 8.9–10.3)
Creatinine, Ser: 0.77 mg/dL (ref 0.44–1.00)
Glucose, Bld: 163 mg/dL — ABNORMAL HIGH (ref 70–99)
POTASSIUM: 4 mmol/L (ref 3.5–5.1)
SODIUM: 140 mmol/L (ref 135–145)

## 2019-01-19 LAB — CBC
HEMATOCRIT: 39.5 % (ref 36.0–46.0)
HEMOGLOBIN: 12.4 g/dL (ref 12.0–15.0)
MCH: 30.3 pg (ref 26.0–34.0)
MCHC: 31.4 g/dL (ref 30.0–36.0)
MCV: 96.6 fL (ref 80.0–100.0)
NRBC: 0 % (ref 0.0–0.2)
Platelets: 214 10*3/uL (ref 150–400)
RBC: 4.09 MIL/uL (ref 3.87–5.11)
RDW: 13.2 % (ref 11.5–15.5)
WBC: 8.1 10*3/uL (ref 4.0–10.5)

## 2019-01-19 LAB — CBG MONITORING, ED
GLUCOSE-CAPILLARY: 147 mg/dL — AB (ref 70–99)
GLUCOSE-CAPILLARY: 182 mg/dL — AB (ref 70–99)

## 2019-01-19 NOTE — ED Triage Notes (Signed)
Pt c/o headache since last night and took BP today and was high. Pt also c/o sugar being high this morning. Takes Metformin 1000 mg per day.

## 2019-01-21 ENCOUNTER — Other Ambulatory Visit: Payer: Self-pay

## 2019-01-21 ENCOUNTER — Emergency Department (HOSPITAL_COMMUNITY): Payer: Medicare HMO

## 2019-01-21 ENCOUNTER — Encounter (HOSPITAL_COMMUNITY): Payer: Self-pay

## 2019-01-21 ENCOUNTER — Emergency Department (HOSPITAL_COMMUNITY)
Admission: EM | Admit: 2019-01-21 | Discharge: 2019-01-21 | Disposition: A | Discharge status: Home / Self Care | Payer: Medicare HMO | Attending: Emergency Medicine | Admitting: Emergency Medicine

## 2019-01-21 DIAGNOSIS — J449 Chronic obstructive pulmonary disease, unspecified: Secondary | ICD-10-CM | POA: Diagnosis not present

## 2019-01-21 DIAGNOSIS — R197 Diarrhea, unspecified: Secondary | ICD-10-CM

## 2019-01-21 DIAGNOSIS — Z7984 Long term (current) use of oral hypoglycemic drugs: Secondary | ICD-10-CM | POA: Diagnosis not present

## 2019-01-21 DIAGNOSIS — E119 Type 2 diabetes mellitus without complications: Secondary | ICD-10-CM | POA: Insufficient documentation

## 2019-01-21 DIAGNOSIS — F17210 Nicotine dependence, cigarettes, uncomplicated: Secondary | ICD-10-CM | POA: Insufficient documentation

## 2019-01-21 DIAGNOSIS — Z79899 Other long term (current) drug therapy: Secondary | ICD-10-CM | POA: Diagnosis not present

## 2019-01-21 DIAGNOSIS — R519 Headache, unspecified: Secondary | ICD-10-CM

## 2019-01-21 DIAGNOSIS — R51 Headache: Secondary | ICD-10-CM | POA: Insufficient documentation

## 2019-01-21 DIAGNOSIS — I10 Essential (primary) hypertension: Secondary | ICD-10-CM | POA: Insufficient documentation

## 2019-01-21 MED ORDER — METOCLOPRAMIDE HCL 5 MG/ML IJ SOLN
10.0000 mg | Freq: Once | INTRAMUSCULAR | Status: AC
  Administered 2019-01-21: 10 mg via INTRAVENOUS
  Filled 2019-01-21: qty 2

## 2019-01-21 MED ORDER — METOCLOPRAMIDE HCL 10 MG PO TABS: 10.0000 mg | ORAL_TABLET | Freq: Four times a day (QID) | ORAL | 0 refills | Status: DC | PRN

## 2019-01-21 MED ORDER — KETOROLAC TROMETHAMINE 30 MG/ML IJ SOLN
15.0000 mg | Freq: Once | INTRAMUSCULAR | Status: AC
  Administered 2019-01-21: 15 mg via INTRAVENOUS
  Filled 2019-01-21: qty 1

## 2019-01-21 MED ORDER — DIPHENHYDRAMINE HCL 50 MG/ML IJ SOLN
25.0000 mg | Freq: Once | INTRAMUSCULAR | Status: AC
  Administered 2019-01-21: 25 mg via INTRAVENOUS
  Filled 2019-01-21: qty 1

## 2019-01-21 NOTE — ED Triage Notes (Addendum)
Pt reports that she was called by someone from the hospital and told to return to be seen. She left without being seen 2 days ago. Pt reports that she is experiencing a headache that is not responding to ibuprofen. Denies nausea, vomiting, or photosensitivity.

## 2019-01-21 NOTE — ED Provider Notes (Signed)
[facility] DEPT Provider Note   CSN: [id number] Arrival date & time: 01/21/19  1705     History   Chief Complaint Chief Complaint  Patient presents with  . Headache    HPI [name] [name] is a 73 y.o. female with a PMHx of HTN, headaches, anemia, asthma/COPD, DM2, and other conditions listed below, who presents to the ED with complaints of 3 days of headaches.  She reports having frontal headaches that go around her head like a band around the top of her head, she describes this pain as 8/10 constant throbbing headaches with no known aggravating factors and unrelieved with 800 mg ibuprofen.  Chart review reveals that she came to the ER on 01/19/19 for c/o headache and hyperglycemia, her CBC was WNL, her BMP showed gluc 163 but otherwise unremarkable, she ended up leaving without being seen.  She came back today because a nurse called her and advised her to be seen for her headaches.  She also mentions that this week she has had some diarrhea, reporting 3 episodes daily of nonbloody diarrhea.  She denies any vision changes, lightheadedness, congestion/URI symptoms, fevers, chills, chest pain, shortness of breath, abdominal pain, nausea, vomiting, constipation, melena, hematochezia, dysuria, hematuria, myalgias, arthralgias, numbness, tingling, focal weakness, or any other complaints at this time.  The history is provided by the patient and medical records. No language interpreter was used.  Headache  Associated symptoms: diarrhea   Associated symptoms: no abdominal pain, no congestion, no fever, no myalgias, no nausea, no numbness, no vomiting and no weakness     Past Medical History:  Diagnosis Date  . Anemia   . Arthritis   . Asthma   . COPD (chronic obstructive pulmonary disease) ([place])   . Diabetes mellitus without complication ([place])   . Headache   . Hypertension   . OSA (obstructive sleep apnea) 01/30/2018  . Pneumonia   . Sleep apnea   . UTI (urinary  tract infection) 2018    Patient Active Problem List   Diagnosis Date Noted  . OSA (obstructive sleep apnea) 01/30/2018  . 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 Symptomatic anemia 07/04/2017  . Bacteremia, escherichia coli 07/04/2017  . Angiodysplasia of duodenum   . Iron deficiency anemia due to chronic blood loss   . Rectal bleeding   . Sepsis ([place]) 06/29/2017  . Acute lower UTI 06/29/2017  . Diabetes mellitus type 2 in obese ([place]) 06/29/2017  . Acute cystitis without hematuria   . TOBACCO DEPENDENCE 02/18/2007  . RHINITIS, ALLERGIC 02/18/2007  . ASTHMA, UNSPECIFIED 02/18/2007  . BACK PAIN, LOW 02/18/2007  . LEG PAIN OR KNEE PAIN 02/18/2007    Past Surgical History:  Procedure Laterality Date  . CESAREAN SECTION    . COLONOSCOPY N/A 07/04/2017   Procedure: COLONOSCOPY;  Surgeon: Jerene Bears, MD;  Location: [facility];  Service: Gastroenterology;  Laterality: N/A;  . ESOPHAGOGASTRODUODENOSCOPY N/A 07/04/2017   Procedure: ESOPHAGOGASTRODUODENOSCOPY (EGD);  Surgeon: Jerene Bears, MD;  Location: [facility];  Service: Gastroenterology;  Laterality: N/A;     OB History   No obstetric history on file.      Home Medications    Prior to Admission medications   Medication Sig Start Date End Date Taking? Authorizing Provider  albuterol (PROVENTIL HFA;VENTOLIN HFA) 108 (90 Base) MCG/ACT inhaler Inhale 2 puffs into the lungs every 6 (six) hours as needed for wheezing or shortness of breath. 07/04/17   Dhungel, Flonnie Overman, MD  diclofenac sodium (VOLTAREN) 1 %  GEL Apply 2 g topically 4 (four) times daily. 01/12/18   Petrucelli, Samantha R, PA-C  ferrous sulfate 325 (65 FE) MG tablet Take 1 tablet (325 mg total) by mouth 3 (three) times daily with meals. Patient taking differently: Take 325 mg by mouth daily.  07/04/17   Dhungel, Flonnie Overman, MD  metFORMIN (GLUCOPHAGE) 1000 MG tablet Take 1 tablet (1,000 mg total) by mouth 2 (two) times daily with a meal. 07/04/17   Dhungel, Nishant, MD    montelukast (SINGULAIR) 10 MG tablet Take 1 tablet (10 mg total) by mouth daily. 07/04/17   Dhungel, Nishant, MD  ramipril (ALTACE) 2.5 MG capsule Take 1 capsule (2.5 mg total) by mouth daily. 07/04/17   Dhungel, Flonnie Overman, MD    Family History Family History  Problem Relation Age of Onset  . COPD Mother   . CAD Neg Hx   . Diabetes Mellitus II Neg Hx     Social History Social History   Tobacco Use  . Smoking status: Current Some Day Smoker    Packs/day: 0.25    Types: Cigarettes  . Smokeless tobacco: Never Used  Substance Use Topics  . Alcohol use: Yes    Alcohol/week: 1.0 standard drinks    Types: 1 Glasses of wine per week  . Drug use: No     Allergies   Patient has no known allergies.   Review of Systems Review of Systems  Constitutional: Negative for chills and fever.  HENT: Negative for congestion.   Eyes: Negative for visual disturbance.  Respiratory: Negative for shortness of breath.   Cardiovascular: Negative for chest pain.  Gastrointestinal: Positive for diarrhea. Negative for abdominal pain, blood in stool, constipation, nausea and vomiting.  Genitourinary: Negative for dysuria and hematuria.  Musculoskeletal: Negative for arthralgias and myalgias.  Skin: Negative for color change.  Allergic/Immunologic: Positive for immunocompromised state (DM2).  Neurological: Positive for headaches. Negative for weakness, light-headedness and numbness.  Psychiatric/Behavioral: Negative for confusion.   All other systems reviewed and are negative for acute change except as noted in the HPI.    Physical Exam Updated Vital Signs BP 115/81 (BP Location: Right Arm)   Pulse 83   Temp 98.2 F (36.8 C)   Resp 14   SpO2 97%   Physical Exam Vitals signs and nursing note reviewed.  Constitutional:      General: She is not in acute distress.    Appearance: Normal appearance. She is well-developed. She is not toxic-appearing.     Comments: Afebrile, nontoxic, NAD  HENT:      Head: Normocephalic and atraumatic.  Eyes:     General: Vision grossly intact.        Right eye: No discharge.        Left eye: No discharge.     Extraocular Movements: Extraocular movements intact.     Conjunctiva/sclera: Conjunctivae normal.     Pupils: Pupils are equal, round, and reactive to light.     Comments: PERRL, EOMI, no nystagmus  Neck:     Musculoskeletal: Normal range of motion and neck supple. Normal range of motion. No neck rigidity, spinous process tenderness or muscular tenderness.     Comments: FROM intact without spinous process TTP, no bony stepoffs or deformities, no paraspinous muscle TTP or muscle spasms. No rigidity or meningeal signs. No bruising or swelling.  Cardiovascular:     Rate and Rhythm: Normal rate and regular rhythm.     Pulses: Normal pulses.     Heart sounds: Normal heart  sounds, S1 normal and S2 normal. No murmur. No friction rub. No gallop.   Pulmonary:     Effort: Pulmonary effort is normal. No respiratory distress.     Breath sounds: Normal breath sounds. No decreased breath sounds, wheezing, rhonchi or rales.  Abdominal:     General: Bowel sounds are normal. There is no distension.     Palpations: Abdomen is soft. Abdomen is not rigid.     Tenderness: There is no abdominal tenderness. There is no right CVA tenderness, left CVA tenderness, guarding or rebound. Negative signs include Murphy's sign and McBurney's sign.  Musculoskeletal: Normal range of motion.     Comments: MAE x4 Strength and sensation grossly intact in all extremities Distal pulses intact Gait steady  Skin:    General: Skin is warm and dry.     Findings: No rash.  Neurological:     General: No focal deficit present.     Mental Status: She is alert and oriented to person, place, and time.     GCS: GCS eye subscore is 4. GCS verbal subscore is 5. GCS motor subscore is 6.     Cranial Nerves: Cranial nerves are intact. No cranial nerve deficit.     Sensory: Sensation is  intact. No sensory deficit.     Motor: Motor function is intact.     Coordination: Coordination is intact. Coordination normal.     Gait: Gait normal.     Comments: CN 2-12 grossly intact A&O x4 GCS 15 Sensation and strength intact Gait nonataxic including with tandem walking Coordination with finger-to-nose WNL Neg pronator drift   Psychiatric:        Mood and Affect: Mood and affect normal.        Behavior: Behavior normal.      ED Treatments / Results  Labs (all labs ordered are listed, but only abnormal results are displayed) Labs Reviewed - No data to display  Results for AIRIANNA, KREISCHER (MRN [id number]) as of 01/21/2019 17:40  Ref. Range 01/19/2019 34:74  BASIC METABOLIC PANEL Unknown Rpt (A)  Sodium Latest Ref Range: 135 - 145 mmol/L 140  Potassium Latest Ref Range: 3.5 - 5.1 mmol/L 4.0  Chloride Latest Ref Range: 98 - 111 mmol/L 105  CO2 Latest Ref Range: 22 - 32 mmol/L 26  Glucose Latest Ref Range: 70 - 99 mg/dL 163 (H)  BUN Latest Ref Range: 8 - 23 mg/dL 10  Creatinine Latest Ref Range: 0.44 - 1.00 mg/dL 0.77  Calcium Latest Ref Range: 8.9 - 10.3 mg/dL 9.5  Anion gap Latest Ref Range: 5 - 15  9  GFR, Est Non African American Latest Ref Range: >60 mL/min >60  GFR, Est African American Latest Ref Range: >60 mL/min >60  WBC Latest Ref Range: 4.0 - 10.5 K/uL 8.1  RBC Latest Ref Range: 3.87 - 5.11 MIL/uL 4.09  Hemoglobin Latest Ref Range: 12.0 - 15.0 g/dL 12.4  HCT Latest Ref Range: 36.0 - 46.0 % 39.5  MCV Latest Ref Range: 80.0 - 100.0 fL 96.6  MCH Latest Ref Range: 26.0 - 34.0 pg 30.3  MCHC Latest Ref Range: 30.0 - 36.0 g/dL 31.4  RDW Latest Ref Range: 11.5 - 15.5 % 13.2  Platelets Latest Ref Range: 150 - 400 K/uL 214  nRBC Latest Ref Range: 0.0 - 0.2 % 0.0    EKG None  Radiology Ct Head Wo Contrast  Result Date: 01/21/2019 CLINICAL DATA:  Throbbing headache since Tuesday. EXAM: CT HEAD WITHOUT CONTRAST TECHNIQUE: Contiguous axial  images were obtained from the  base of the skull through the vertex without intravenous contrast. COMPARISON:  06/29/2017 CT, 06/16/2018 MRI FINDINGS: Brain: Stable age related involutional changes of the brain with chronic microvascular ischemic disease of periventricular and subcortical white matter. No acute intracranial hemorrhage, intra-axial mass nor extra-axial fluid. No large vascular territory infarct. Brainstem and cerebellum are nonacute. No effacement of basal cisterns. Fourth ventricle is midline Vascular: No hyperdense vessel or unexpected calcification. Skull: Normal. Negative for fracture or focal lesion. Sinuses/Orbits: No acute finding. Other: None. IMPRESSION: Stable age related involutional changes of the brain without acute intracranial abnormality. 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 Electronically Signed   By: Ashley Royalty M.D.   On: 01/21/2019 18:51    Procedures Procedures (including critical care time)  Medications Ordered in ED Medications  ketorolac (TORADOL) 30 MG/ML injection 15 mg (15 mg Intravenous Given 01/21/19 1824)  metoCLOPramide (REGLAN) injection 10 mg (10 mg Intravenous Given 01/21/19 1824)    And  diphenhydrAMINE (BENADRYL) injection 25 mg (25 mg Intravenous Given 01/21/19 1824)     Initial Impression / Assessment and Plan / ED Course  I have reviewed the triage vital signs and the nursing notes.  Pertinent labs & imaging results that were available during my care of the patient were reviewed by me and considered in my medical decision making (see chart for details).     73 y.o. female with headaches for the last 3 days.  On exam, no focal neuro deficits, no meningismus, afebrile and nontoxic, vital signs stable.  Labs done several days ago were reassuring, glucose 163 but otherwise BMP and CBC within normal limits.  Doubt need for repeat labs.  Will obtain CT head and give migraine cocktail, and reassess shortly.  Discussed case with my  attending Dr. Venora Maples who agrees with plan.  7:14 PM CT head without acute findings. Pt feeling better. Suspect tension headache. Doubt acute emergent pathology or secondary etiology of her headache. Doubt need for further emergent work up. Advised adequate hydration, tylenol/motrin for headaches, will send home with reglan to use for headaches, and f/up with PCP in 5-7 days for recheck. As for diarrhea, advised BRAT diet and other OTC remedies for symptomatic relief. I explained the diagnosis and have given explicit precautions to return to the ER including for any other new or worsening symptoms. The patient understands and accepts the medical plan as it's been dictated and I have answered their questions. Discharge instructions concerning home care and prescriptions have been given. The patient is STABLE and is discharged to home in good condition.    Final Clinical Impressions(s) / ED Diagnoses   Final diagnoses:  Acute nonintractable headache, unspecified headache type  Diarrhea, unspecified type    ED Discharge Orders         Ordered    metoCLOPramide (REGLAN) 10 MG tablet  Every 6 hours PRN     01/21/19 [address], [place], Hershal Coria 01/21/19 1915    Jola Schmidt, MD 01/21/19 2012

## 2019-01-21 NOTE — Discharge Instructions (Signed)
Alternate between tylenol and motrin as needed for pain. Use reglan as needed for headaches or nausea. Stay well hydrated. Get plenty of rest. Follow up with your regular doctor in 5-7 days for recheck of symptoms and ongoing management of your headaches. Return to the ER for changes or worsening symptoms.  For your diarrhea: May consider using over the counter tums, maalox, pepto bismol, or other over the counter remedies to help with symptoms. Stay well hydrated with small sips of fluids throughout the day. Follow a BRAT (banana-rice-applesauce-toast) diet as described below for the next 24-48 hours. The 'BRAT' diet is suggested, then progress to diet as tolerated as symptoms abate. Call your regular doctor if bloody stools, persistent diarrhea, vomiting, fever or abdominal pain. Follow up with your regular doctor in 5-7 days for recheck of symptoms. Return to ER for changing or worsening of symptoms.

## 2019-07-28 ENCOUNTER — Other Ambulatory Visit: Payer: Self-pay

## 2019-07-28 ENCOUNTER — Emergency Department (HOSPITAL_COMMUNITY)
Admission: EM | Admit: 2019-07-28 | Discharge: 2019-07-28 | Disposition: A | Discharge status: Home / Self Care | Payer: Medicare HMO | Attending: Emergency Medicine | Admitting: Emergency Medicine

## 2019-07-28 ENCOUNTER — Encounter (HOSPITAL_COMMUNITY): Payer: Self-pay | Admitting: Emergency Medicine

## 2019-07-28 DIAGNOSIS — Z79899 Other long term (current) drug therapy: Secondary | ICD-10-CM | POA: Insufficient documentation

## 2019-07-28 DIAGNOSIS — F17210 Nicotine dependence, cigarettes, uncomplicated: Secondary | ICD-10-CM | POA: Insufficient documentation

## 2019-07-28 DIAGNOSIS — J449 Chronic obstructive pulmonary disease, unspecified: Secondary | ICD-10-CM | POA: Insufficient documentation

## 2019-07-28 DIAGNOSIS — M545 Low back pain, unspecified: Secondary | ICD-10-CM

## 2019-07-28 DIAGNOSIS — Z7984 Long term (current) use of oral hypoglycemic drugs: Secondary | ICD-10-CM | POA: Diagnosis not present

## 2019-07-28 DIAGNOSIS — E119 Type 2 diabetes mellitus without complications: Secondary | ICD-10-CM | POA: Diagnosis not present

## 2019-07-28 DIAGNOSIS — I10 Essential (primary) hypertension: Secondary | ICD-10-CM | POA: Insufficient documentation

## 2019-07-28 MED ORDER — LIDOCAINE 5 % EX PTCH: 1.0000 | MEDICATED_PATCH | CUTANEOUS | 0 refills | Status: AC

## 2019-07-28 MED ORDER — CYCLOBENZAPRINE HCL 10 MG PO TABS
5.0000 mg | ORAL_TABLET | Freq: Once | ORAL | Status: AC
  Administered 2019-07-28: 5 mg via ORAL
  Filled 2019-07-28: qty 1

## 2019-07-28 MED ORDER — LIDOCAINE 5 % EX PTCH
1.0000 | MEDICATED_PATCH | CUTANEOUS | Status: DC
  Administered 2019-07-28: 1 via TRANSDERMAL
  Filled 2019-07-28: qty 1

## 2019-07-28 MED ORDER — CYCLOBENZAPRINE HCL 5 MG PO TABS: 5.0000 mg | ORAL_TABLET | Freq: Two times a day (BID) | ORAL | 0 refills | Status: DC | PRN

## 2019-07-28 MED ORDER — KETOROLAC TROMETHAMINE 60 MG/2ML IM SOLN
30.0000 mg | Freq: Once | INTRAMUSCULAR | Status: AC
  Administered 2019-07-28: 30 mg via INTRAMUSCULAR
  Filled 2019-07-28: qty 2

## 2019-07-28 NOTE — ED Triage Notes (Signed)
Pt complaint of lower back pain with hx of same; started Tuesday after lifting someone. Denies GU symptoms.

## 2019-07-28 NOTE — ED Provider Notes (Signed)
[facility] DEPT Provider Note   CSN: [id number] Arrival date & time: 07/28/19  1010    History   Chief Complaint Chief Complaint  Patient presents with  . Back Pain    HPI [name] [name] is a 73 y.o. female.     The history is provided by the patient.  Back Pain Location:  Lumbar spine Quality:  Aching Radiates to:  Does not radiate Pain severity:  Mild Onset quality:  Gradual Timing:  Intermittent Progression:  Waxing and waning Chronicity:  New Context: lifting heavy objects and twisting   Relieved by:  NSAIDs Worsened by:  Movement Associated symptoms: no abdominal pain, no abdominal swelling, no bladder incontinence, no bowel incontinence, no chest pain, no dysuria, no fever, no headaches, no leg pain, no numbness, no paresthesias, no pelvic pain, no perianal numbness, no tingling, no weakness and no weight loss     Past Medical History:  Diagnosis Date  . Anemia   . Arthritis   . Asthma   . COPD (chronic obstructive pulmonary disease) ([place])   . Diabetes mellitus without complication ([place])   . Headache   . Hypertension   . OSA (obstructive sleep apnea) 01/30/2018  . Pneumonia   . Sleep apnea   . UTI (urinary tract infection) 2018    Patient Active Problem List   Diagnosis Date Noted  . OSA (obstructive sleep apnea) 01/30/2018  . 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 Symptomatic anemia 07/04/2017  . Bacteremia, escherichia coli 07/04/2017  . Angiodysplasia of duodenum   . Iron deficiency anemia due to chronic blood loss   . Rectal bleeding   . Sepsis ([place]) 06/29/2017  . Acute lower UTI 06/29/2017  . Diabetes mellitus type 2 in obese ([place]) 06/29/2017  . Acute cystitis without hematuria   . TOBACCO DEPENDENCE 02/18/2007  . RHINITIS, ALLERGIC 02/18/2007  . ASTHMA, UNSPECIFIED 02/18/2007  . BACK PAIN, LOW 02/18/2007  . LEG PAIN OR KNEE PAIN 02/18/2007    Past Surgical History:  Procedure Laterality Date  . CESAREAN SECTION     . COLONOSCOPY N/A 07/04/2017   Procedure: COLONOSCOPY;  Surgeon: Jerene Bears, MD;  Location: [facility];  Service: Gastroenterology;  Laterality: N/A;  . ESOPHAGOGASTRODUODENOSCOPY N/A 07/04/2017   Procedure: ESOPHAGOGASTRODUODENOSCOPY (EGD);  Surgeon: Jerene Bears, MD;  Location: [facility];  Service: Gastroenterology;  Laterality: N/A;     OB History   No obstetric history on file.      Home Medications    Prior to Admission medications   Medication Sig Start Date End Date Taking? Authorizing Provider  albuterol (PROVENTIL HFA;VENTOLIN HFA) 108 (90 Base) MCG/ACT inhaler Inhale 2 puffs into the lungs every 6 (six) hours as needed for wheezing or shortness of breath. 07/04/17   Dhungel, Flonnie Overman, MD  cyclobenzaprine (FLEXERIL) 5 MG tablet Take 1 tablet (5 mg total) by mouth 2 (two) times daily as needed for up to 15 doses for muscle spasms. 07/28/19   Yedidya Duddy, DO  diclofenac sodium (VOLTAREN) 1 % GEL Apply 2 g topically 4 (four) times daily. 01/12/18   Petrucelli, Samantha R, PA-C  ferrous sulfate 325 (65 FE) MG tablet Take 1 tablet (325 mg total) by mouth 3 (three) times daily with meals. Patient taking differently: Take 325 mg by mouth daily.  07/04/17   Dhungel, Nishant, MD  lidocaine (LIDODERM) 5 % Place 1 patch onto the skin daily for 15 days. Remove & Discard patch within 12 hours or as directed by MD 07/28/19  08/12/19  Simora Dingee, DO  metFORMIN (GLUCOPHAGE) 1000 MG tablet Take 1 tablet (1,000 mg total) by mouth 2 (two) times daily with a meal. 07/04/17   Dhungel, Nishant, MD  metoCLOPramide (REGLAN) 10 MG tablet Take 1 tablet (10 mg total) by mouth every 6 (six) hours as needed for nausea (nausea/headache). 01/21/19   Street, Mercedes, PA-C  montelukast (SINGULAIR) 10 MG tablet Take 1 tablet (10 mg total) by mouth daily. 07/04/17   Dhungel, Nishant, MD  ramipril (ALTACE) 2.5 MG capsule Take 1 capsule (2.5 mg total) by mouth daily. 07/04/17   Dhungel, Flonnie Overman, MD    Family  History Family History  Problem Relation Age of Onset  . COPD Mother   . CAD Neg Hx   . Diabetes Mellitus II Neg Hx     Social History Social History   Tobacco Use  . Smoking status: Current Some Day Smoker    Packs/day: 0.25    Types: Cigarettes  . Smokeless tobacco: Never Used  Substance Use Topics  . Alcohol use: Yes    Alcohol/week: 1.0 standard drinks    Types: 1 Glasses of wine per week  . Drug use: No     Allergies   Patient has no known allergies.   Review of Systems Review of Systems  Constitutional: Negative for chills, fever and weight loss.  HENT: Negative for ear pain and sore throat.   Eyes: Negative for pain and visual disturbance.  Respiratory: Negative for cough and shortness of breath.   Cardiovascular: Negative for chest pain and palpitations.  Gastrointestinal: Negative for abdominal pain, bowel incontinence and vomiting.  Genitourinary: Negative for bladder incontinence, dysuria, hematuria and pelvic pain.  Musculoskeletal: Positive for back pain. Negative for arthralgias.  Skin: Negative for color change and rash.  Neurological: Negative for tingling, tremors, seizures, syncope, weakness, numbness, headaches and paresthesias.  All other systems reviewed and are negative.    Physical Exam Updated Vital Signs  ED Triage Vitals  Enc Vitals Group     BP 07/28/19 1038 (!) 125/105     Pulse Rate 07/28/19 1038 83     Resp 07/28/19 1038 20     Temp 07/28/19 1038 98.7 F (37.1 C)     Temp Source 07/28/19 1038 Oral     SpO2 07/28/19 1038 100 %     Weight --      Height --      Head Circumference --      Peak Flow --      Pain Score 07/28/19 1040 10     Pain Loc --      Pain Edu? --      Excl. in [place]? --     Physical Exam Vitals signs and nursing note reviewed.  Constitutional:      General: She is not in acute distress.    Appearance: She is well-developed.  HENT:     Head: Normocephalic and atraumatic.  Eyes:     Extraocular  Movements: Extraocular movements intact.     Conjunctiva/sclera: Conjunctivae normal.     Pupils: Pupils are equal, round, and reactive to light.  Neck:     Musculoskeletal: Normal range of motion and neck supple. No muscular tenderness.  Cardiovascular:     Rate and Rhythm: Normal rate and regular rhythm.     Heart sounds: No murmur.  Pulmonary:     Effort: Pulmonary effort is normal. No respiratory distress.     Breath sounds: Normal breath sounds.  Abdominal:  Palpations: Abdomen is soft.     Tenderness: There is no abdominal tenderness.  Musculoskeletal: Normal range of motion.        General: Tenderness present.     Comments: No midline spinal tenderness, TTP to paraspinal lumbar muscles on left  Skin:    General: Skin is warm and dry.  Neurological:     General: No focal deficit present.     Mental Status: She is alert and oriented to person, place, and time.     Cranial Nerves: No cranial nerve deficit.     Sensory: No sensory deficit.     Motor: No weakness.     Comments: 5+ out of 5 strength, normal sensation      ED Treatments / Results  Labs (all labs ordered are listed, but only abnormal results are displayed) Labs Reviewed - No data to display  EKG None  Radiology No results found.  Procedures Procedures (including critical care time)  Medications Ordered in ED Medications  ketorolac (TORADOL) injection 30 mg (has no administration in time range)  lidocaine (LIDODERM) 5 % 1 patch (1 patch Transdermal Patch Applied 07/28/19 1303)  cyclobenzaprine (FLEXERIL) tablet 5 mg (has no administration in time range)     Initial Impression / Assessment and Plan / ED Course  I have reviewed the triage vital signs and the nursing notes.  Pertinent labs & imaging results that were available during my care of the patient were reviewed by me and considered in my medical decision making (see chart for details).     [name] [name] is a 73 year old female with  history of diabetes, asthma, COPD, UTI who presents to the ED with left lower back pain.  Pain for the last several days.  Injured it lifting things several days ago.  Does not have any signs to suggest cauda equina.  Has point tenderness in the left lower back.  No midline spinal tenderness.  Given Toradol shot, lidocaine patch, Flexeril.  Will prescribe lidocaine patch and Flexeril.  Recommend follow-up with primary care doctor.  Discharged in ED in good condition.  This chart was dictated using voice recognition software.  Despite best efforts to proofread,  errors can occur which can change the documentation meaning.    Final Clinical Impressions(s) / ED Diagnoses   Final diagnoses:  Acute left-sided low back pain, unspecified whether sciatica present    ED Discharge Orders         Ordered    cyclobenzaprine (FLEXERIL) 5 MG tablet  2 times daily PRN     07/28/19 1304    lidocaine (LIDODERM) 5 %  Every 24 hours     07/28/19 1304           Floree Zuniga, DO 07/28/19 1308

## 2019-11-23 DIAGNOSIS — M47816 Spondylosis without myelopathy or radiculopathy, lumbar region: Secondary | ICD-10-CM | POA: Insufficient documentation

## 2020-02-02 ENCOUNTER — Ambulatory Visit: Payer: Medicare HMO | Admitting: Pulmonary Disease

## 2020-02-02 ENCOUNTER — Encounter: Payer: Self-pay | Admitting: Pulmonary Disease

## 2020-02-02 ENCOUNTER — Other Ambulatory Visit: Payer: Self-pay

## 2020-02-02 VITALS — BP 118/66 | HR 112 | Temp 97.0°F | Ht 65.5 in | Wt 193.6 lb

## 2020-02-02 DIAGNOSIS — G4733 Obstructive sleep apnea (adult) (pediatric): Secondary | ICD-10-CM | POA: Diagnosis not present

## 2020-02-02 NOTE — Patient Instructions (Signed)
Will arrange for auto CPAP set up  Follow up in 2 months 

## 2020-02-02 NOTE — Progress Notes (Signed)
Pulmonary, Critical Care, and Sleep Medicine  Chief Complaint  Patient presents with  . Follow-up    Patient is here to reestablish care for sleep apnea. Patient moved away and is back. Patient states that her children have witnessed her stop breathing and asked her to get checked out. Patient states she sleeps maybe 2 hours at night.    Constitutional:  BP 118/66 (BP Location: Right Arm, Patient Position: Sitting, Cuff Size: Normal)   Pulse (!) 112   Temp (!) 97 F (36.1 C) (Temporal)   Ht 5' 5.5" (1.664 m)   Wt 193 lb 9.6 oz (87.8 kg)   SpO2 95%   BMI 31.73 kg/m   Past Medical History:  Spinal stenosis, DM, HA, HTN, PNA, Asthma  Brief Summary:  [name] [name] is a 74 y.o. female smoker with obstructive sleep apnea.  She had home sleep study in 2019.  Moderate OSA.  Her brother passed away and she wasn't able to get CPAP set up at that time.  Her children have told her that her snoring is worse, and she stops breathing.  She wakes up every 2 hours.  She is tired all day long.  Physical Exam:   Appearance - well kempt   ENMT - clear nasal mucosa, midline nasal  septum, no oral exudates, no LAN, trachea midline, MP 4, partial dentures  Respiratory - normal chest wall, normal respiratory effort, no accessory muscle use, no wheeze/rales  CV - s1s2 regular rate and rhythm, no murmurs, no peripheral edema, radial pulses symmetric  Ext - no cyanosis, clubbing, or joint inflammation noted  Skin - no rashes, lesions, or ulcers  Psych - normal mood and affect   Assessment/Plan:    Snoring with excessive daytime sleepiness from obstructive sleep apnea. - reviewed her sleep study with her - various therapies for treatment were reviewed: CPAP, oral appliance, and surgical interventions - will arrange for auto CPAP set up - explained she might need repeat home sleep study for insurance coverage of CPAP set up  Obesity. - discussed how weight can impact sleep and risk  for sleep disordered breathing - discussed options to assist with weight loss: combination of diet modification, cardiovascular and strength training exercises  Cardiovascular risk. - had an extensive discussion regarding the adverse health consequences related to untreated sleep disordered breathing - specifically discussed the risks for hypertension, coronary artery disease, cardiac dysrhythmias, cerebrovascular disease, and diabetes - lifestyle modification discussed  Safe driving practices. - discussed how sleep disruption can increase risk of accidents, particularly when driving - safe driving practices were discussed  Therapies for obstructive sleep apnea. - if the sleep study shows significant sleep apnea, then     Patient Instructions  Will arrange for auto CPAP set up  Follow up in 2 months  Time spent 22 minutes  Chesley Mires, MD [place] Pager: [phone] 02/02/2020, 3:07 PM  Flow Sheet    Sleep tests:  HST 01/12/18 >> AHI 22.1, SaO2 low 71%.  Medications:   Allergies as of 02/02/2020   No Known Allergies     Medication List       Accurate as of February 02, 2020  3:07 PM. If you have any questions, ask your nurse or doctor.        STOP taking these medications   montelukast 10 MG tablet Commonly known as: SINGULAIR Stopped by: Chesley Mires, MD     TAKE these medications   albuterol 108 (90 Base) MCG/ACT inhaler  Commonly known as: VENTOLIN HFA Inhale 2 puffs into the lungs every 6 (six) hours as needed for wheezing or shortness of breath.   aspirin EC 81 MG tablet Take 81 mg by mouth daily.   cyclobenzaprine 5 MG tablet Commonly known as: FLEXERIL Take 1 tablet (5 mg total) by mouth 2 (two) times daily as needed for up to 15 doses for muscle spasms.   diclofenac sodium 1 % Gel Commonly known as: VOLTAREN Apply 2 g topically 4 (four) times daily.   ferrous sulfate 325 (65 FE) MG tablet Take 1 tablet (325 mg total) by  mouth 3 (three) times daily with meals. What changed: when to take this   metFORMIN 1000 MG tablet Commonly known as: GLUCOPHAGE Take 1 tablet (1,000 mg total) by mouth 2 (two) times daily with a meal.   metoCLOPramide 10 MG tablet Commonly known as: Reglan Take 1 tablet (10 mg total) by mouth every 6 (six) hours as needed for nausea (nausea/headache).   ramipril 2.5 MG capsule Commonly known as: ALTACE Take 1 capsule (2.5 mg total) by mouth daily.       Past Surgical History:  She  has a past surgical history that includes Cesarean section; Esophagogastroduodenoscopy (N/A, 07/04/2017); and Colonoscopy (N/A, 07/04/2017).  Family History:  Her family history includes COPD in her mother.  Social History:  She  reports that she has been smoking cigarettes. She has been smoking about 0.25 packs per day. She has never used smokeless tobacco. She reports current alcohol use of about 1.0 standard drinks of alcohol per week. She reports that she does not use drugs.

## 2020-02-16 ENCOUNTER — Other Ambulatory Visit: Payer: Self-pay

## 2020-02-16 ENCOUNTER — Other Ambulatory Visit: Payer: Self-pay | Admitting: Podiatry

## 2020-02-16 ENCOUNTER — Encounter: Payer: Self-pay | Admitting: Podiatry

## 2020-02-16 ENCOUNTER — Ambulatory Visit (INDEPENDENT_AMBULATORY_CARE_PROVIDER_SITE_OTHER): Payer: Medicare HMO

## 2020-02-16 ENCOUNTER — Ambulatory Visit: Payer: Medicare HMO | Admitting: Podiatry

## 2020-02-16 VITALS — BP 131/72 | HR 81 | Temp 98.1°F

## 2020-02-16 DIAGNOSIS — M7661 Achilles tendinitis, right leg: Secondary | ICD-10-CM

## 2020-02-16 DIAGNOSIS — M7662 Achilles tendinitis, left leg: Secondary | ICD-10-CM | POA: Diagnosis not present

## 2020-02-16 NOTE — Progress Notes (Signed)
Subjective:  Patient ID: [name] [name], female    DOB: Oct 21, 1946,  MRN: [id number] HPI Chief Complaint  Patient presents with  . Foot Pain    Achilles right - sharp, shooting pains x 2 weeks, woke up one morning with pain and swelling, getting worse, feels pain into calf now, tried acewrap  . New Patient (Initial Visit)    74 y.o. female presents with the above complaint.   ROS: Denies fever chills nausea vomiting muscle aches pains calf pain back pain chest pain shortness of breath.  Past Medical History:  Diagnosis Date  . Anemia   . Arthritis   . Asthma   . COPD (chronic obstructive pulmonary disease) ([place])   . Diabetes mellitus without complication ([place])   . Headache   . Hypertension   . OSA (obstructive sleep apnea) 01/30/2018  . Pneumonia   . Sleep apnea   . UTI (urinary tract infection) 2018   Past Surgical History:  Procedure Laterality Date  . CESAREAN SECTION    . COLONOSCOPY N/A 07/04/2017   Procedure: COLONOSCOPY;  Surgeon: Jerene Bears, MD;  Location: [facility];  Service: Gastroenterology;  Laterality: N/A;  . ESOPHAGOGASTRODUODENOSCOPY N/A 07/04/2017   Procedure: ESOPHAGOGASTRODUODENOSCOPY (EGD);  Surgeon: Jerene Bears, MD;  Location: [facility];  Service: Gastroenterology;  Laterality: N/A;    Current Outpatient Medications:  .  albuterol (PROVENTIL HFA;VENTOLIN HFA) 108 (90 Base) MCG/ACT inhaler, Inhale 2 puffs into the lungs every 6 (six) hours as needed for wheezing or shortness of breath., Disp: 3 Inhaler, Rfl: 0 .  aspirin EC 81 MG tablet, Take 81 mg by mouth daily., Disp: , Rfl:  .  baclofen (LIORESAL) 10 MG tablet, Take 10 mg by mouth 3 (three) times daily., Disp: , Rfl:  .  cyclobenzaprine (FLEXERIL) 5 MG tablet, Take 1 tablet (5 mg total) by mouth 2 (two) times daily as needed for up to 15 doses for muscle spasms., Disp: 15 tablet, Rfl: 0 .  diclofenac sodium (VOLTAREN) 1 % GEL, Apply 2 g topically 4 (four) times daily., Disp: 100 g, Rfl: 0 .   ferrous sulfate 325 (65 FE) MG tablet, Take 1 tablet (325 mg total) by mouth 3 (three) times daily with meals. (Patient taking differently: Take 325 mg by mouth daily. ), Disp: 90 tablet, Rfl: 0 .  metFORMIN (GLUCOPHAGE) 1000 MG tablet, Take 1 tablet (1,000 mg total) by mouth 2 (two) times daily with a meal., Disp: 60 tablet, Rfl: 0 .  metoCLOPramide (REGLAN) 10 MG tablet, Take 1 tablet (10 mg total) by mouth every 6 (six) hours as needed for nausea (nausea/headache)., Disp: 12 tablet, Rfl: 0 .  ramipril (ALTACE) 2.5 MG capsule, Take 1 capsule (2.5 mg total) by mouth daily., Disp: 30 capsule, Rfl: 0  No Known Allergies Review of Systems Objective:   Vitals:   02/16/20 1043  BP: 131/72  Pulse: 81  Temp: 98.1 F (36.7 C)    General: Well developed, nourished, in no acute distress, alert and oriented x3   Dermatological: Skin is warm, dry and supple bilateral. Nails x 10 are well maintained; remaining integument appears unremarkable at this time. There are no open sores, no preulcerative lesions, no rash or signs of infection present.  Vascular: Dorsalis Pedis artery and Posterior Tibial artery pedal pulses are 2/4 bilateral with immedate capillary fill time. Pedal hair growth present. No varicosities and no lower extremity edema present bilateral.   Neruologic: Grossly intact via light touch bilateral. Vibratory intact via tuning  fork bilateral. Protective threshold with Semmes Wienstein monofilament intact to all pedal sites bilateral. Patellar and Achilles deep tendon reflexes 2+ bilateral. No Babinski or clonus noted bilateral.   Musculoskeletal: No gross boney pedal deformities bilateral. No pain, crepitus, or limitation noted with foot and ankle range of motion bilateral. Muscular strength 5/5 in all groups tested bilateral.  Appear to be some soft tissue swelling possibly consistent with bursitis.  She has pain on palpation of the posterior inferior aspect of the Achilles about mid  calcaneal level.  There appears to be nice normal margins of the Achilles no masses.  Gait: Unassisted, Nonantalgic.    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  There does  Assessment & Plan:   Assessment: Insertional Achilles tendinitis right.  Plan: Because of the diabetes I did not provide her with any steroids or nonsteroidals.  She will continue the use of her Voltaren gel.  I did inject her with 2 mg of dexamethasone local anesthetic sublesional at the area of the bursitis.  Also placed her in a cam walker and recommended that she is to stay in this 24/7.  I will follow-up with her in a couple weeks.     Jacobs Golab T. [place], [place]

## 2020-03-29 ENCOUNTER — Ambulatory Visit: Payer: Medicare HMO | Admitting: Podiatry

## 2020-04-25 ENCOUNTER — Encounter: Payer: Self-pay | Admitting: Pulmonary Disease

## 2020-04-25 ENCOUNTER — Ambulatory Visit: Payer: Medicare HMO | Admitting: Pulmonary Disease

## 2020-04-25 ENCOUNTER — Other Ambulatory Visit: Payer: Self-pay

## 2020-04-25 VITALS — BP 124/72 | HR 77 | Temp 97.3°F | Ht 65.5 in | Wt 201.0 lb

## 2020-04-25 DIAGNOSIS — Z87891 Personal history of nicotine dependence: Secondary | ICD-10-CM

## 2020-04-25 DIAGNOSIS — G4733 Obstructive sleep apnea (adult) (pediatric): Secondary | ICD-10-CM | POA: Diagnosis not present

## 2020-04-25 NOTE — Progress Notes (Signed)
Reviewed and agree with assessment/plan.   Arty Lantzy, MD [place] [facility] 12/17/2016, 12:24 PM Pager:  [phone]  

## 2020-04-25 NOTE — Assessment & Plan Note (Signed)
Moderate obstructive sleep apnea Doing well on CPAP Needs mask fitting  Plan: We will coordinate mask fitting with [employer] We will check compliance in 2 months 74-month follow-up in our office

## 2020-04-25 NOTE — Patient Instructions (Addendum)
You were seen today by Lauraine Rinne, NP  for:   1. OSA (obstructive sleep apnea)  We will place an order for [employer] to get you scheduled for a mask fitting  We recommend that you continue using your CPAP daily >>>Keep up the hard work using your device >>> Goal should be wearing this for the entire night that you are sleeping, at least 4 to 6 hours  Remember:  . Do not drive or operate heavy machinery if tired or drowsy.  . Please notify the supply company and office if you are unable to use your device regularly due to missing supplies or machine being broken.  . Work on maintaining a healthy weight and following your recommended nutrition plan  . Maintain proper daily exercise and movement  . Maintaining proper use of your device can also help improve management of other chronic illnesses such as: Blood pressure, blood sugars, and weight management.   BiPAP/ CPAP Cleaning:  >>>Clean weekly, with Dawn soap, and bottle brush.  Set up to air dry. >>> Wipe mask out daily with wet wipe or towelette    2. Former smoker  Engineer, manufacturing job stopping smoking  Continue to not smoke  Follow Up:    Return in about 6 months (around 10/26/2020), or if symptoms worsen or fail to improve, for Follow up with Dr. Halford Chessman.   Please do your part to reduce the spread of COVID-19:      Reduce your risk of any infection  and COVID19 by using the similar precautions used for avoiding the common cold or flu:  Marland Kitchen Wash your hands often with soap and warm water for at least 20 seconds.  If soap and water are not readily available, use an alcohol-based hand sanitizer with at least 60% alcohol.  . If coughing or sneezing, cover your mouth and nose by coughing or sneezing into the elbow areas of your shirt or coat, into a tissue or into your sleeve (not your hands). Langley Gauss A MASK when in public  . Avoid shaking hands with others and consider head nods or verbal greetings only. . Avoid touching your eyes, nose, or  mouth with unwashed hands.  . Avoid close contact with people who are sick. . Avoid places or events with large numbers of people in one location, like concerts or sporting events. . If you have some symptoms but not all symptoms, continue to monitor at home and seek medical attention if your symptoms worsen. . If you are having a medical emergency, call 911.   [place] / e-Visit: [email or web address]         [facility]: [facility]: [id number]                   [facility]: [id number]     It is flu season:   >>> Best ways to protect herself from the flu: Receive the yearly flu vaccine, practice good hand hygiene washing with soap and also using hand sanitizer when available, eat a nutritious meals, get adequate rest, hydrate appropriately   Please contact the office if your symptoms worsen or you have concerns that you are not improving.   Thank you for choosing [facility] for your healthcare, and for allowing [place] to partner with you on your healthcare journey. I am thankful to be able to provide care to you today.   Wyn Quaker FNP-C

## 2020-04-25 NOTE — Assessment & Plan Note (Signed)
Congratulated patient on stopping smoking Offered smoking cessation support today, patient declined Patient is aware to contact her office if she becomes interested  Plan: Continue follow-up in 6 months Continue to not smoke

## 2020-04-25 NOTE — Progress Notes (Signed)
@Patient  ID: [name] [name], female    DOB: 1946-04-09, 74 y.o.   MRN: [id number]  Chief Complaint  Patient presents with  . Follow-up    2 month f/u for OSA. States she has been using her machine every night. Has been waking up every morning with dry mouth.     Referring provider: Bartholome Bill, MD  HPI:  74 year old female former smoker followed in our office for moderate obstructive sleep apnea  PMH: Spinal stenosis, DM, HA, HTN, PNA, Asthma Smoker/ Smoking History: Former smoker Maintenance: None Pt of: Dr. Halford Chessman  04/25/2020  - Visit   74 year old female former smoker followed in our office for moderate obstructive sleep apnea.  Patient started CPAP therapy 2 months ago.  She already is feeling a clinical relief and improve sleep quality.  CPAP compliance report shows excellent compliance.  See some compliance report listed below:  03/26/2020-04/24/2020-29 the last 30 days used, 28 of those days greater than 4 hours, average usage 6 hours and 2 minutes, APAP setting 5-15, 95th percentile 14.9, AHI 6.2 >>>Leaks on compliance report  Patient reports that she does feel like she is having often leaks.  She is using a full facemask.  She reports that positionally sometimes it slides up when she is sleeping.  She is contacted her DME company for an adjustment with the mask.  We will discuss this today.  Questionaires / Pulmonary Flowsheets:   Epworth:  Results of the Epworth flowsheet 12/28/2017  Sitting and reading 3  Watching TV 3  Sitting, inactive in a public place (e.g. a theatre or a meeting) 3  As a passenger in a car for an hour without a break 3  Lying down to rest in the afternoon when circumstances permit 3  Sitting and talking to someone 2  Sitting quietly after a lunch without alcohol 3  In a car, while stopped for a few minutes in traffic 2  Total score 22    Tests:   HST 01/12/18 >> AHI 22.1, SaO2 low 71%.  FENO:  No results found for: NITRICOXIDE  PFT: No  flowsheet data found.  WALK:  No flowsheet data found.  Imaging: No results found.  Lab Results:  CBC    Component Value Date/Time   WBC 8.1 01/19/2019 1324   RBC 4.09 01/19/2019 1324   HGB 12.4 01/19/2019 1324   HCT 39.5 01/19/2019 1324   PLT 214 01/19/2019 1324   MCV 96.6 01/19/2019 1324   MCH 30.3 01/19/2019 1324   MCHC 31.4 01/19/2019 1324   RDW 13.2 01/19/2019 1324   LYMPHSABS 2.8 09/02/2017 1537   MONOABS 0.8 09/02/2017 1537   EOSABS 0.4 09/02/2017 1537   BASOSABS 0.1 09/02/2017 1537    BMET    Component Value Date/Time   NA 140 01/19/2019 1324   K 4.0 01/19/2019 1324   CL 105 01/19/2019 1324   CO2 26 01/19/2019 1324   GLUCOSE 163 (H) 01/19/2019 1324   BUN 10 01/19/2019 1324   CREATININE 0.77 01/19/2019 1324   CALCIUM 9.5 01/19/2019 1324   GFRNONAA >60 01/19/2019 1324   GFRAA >60 01/19/2019 1324    BNP No results found for: BNP  ProBNP No results found for: PROBNP  Specialty Problems      Pulmonary Problems   RHINITIS, ALLERGIC    Qualifier: Diagnosis of  By: Drucie Ip        Mild intermittent asthma without complication    Last Assessment & Plan:  Stable, no  need for albuterol except on occasion, advised to quit smoking and plans to try later this month.      OSA (obstructive sleep apnea)      No Known Allergies  Immunization History  Administered Date(s) Administered  . Influenza Split 08/28/2017  . Influenza-Unspecified 08/28/2017  . PPD Test 09/14/2017, 09/20/2018, 09/20/2018    Past Medical History:  Diagnosis Date  . Anemia   . Arthritis   . Asthma   . COPD (chronic obstructive pulmonary disease) ([place])   . Diabetes mellitus without complication ([place])   . Headache   . Hypertension   . OSA (obstructive sleep apnea) 01/30/2018  . Pneumonia   . Sleep apnea   . UTI (urinary tract infection) 2018    Tobacco History: Social History   Tobacco Use  Smoking Status Former Smoker  . Packs/day: 0.25  . Types:  Cigarettes  . Start date: 12/22/1960  . Quit date: 02/27/2020  . Years since quitting: 0.1  Smokeless Tobacco Never Used   Counseling given: Yes  Patient reports she is stop smoking.  She did this on her own.  She reports is going well.  Still has occasional cravings.  Declined smoking cessation support today.  Outpatient Encounter Medications as of 04/25/2020  Medication Sig  . albuterol (PROVENTIL HFA;VENTOLIN HFA) 108 (90 Base) MCG/ACT inhaler Inhale 2 puffs into the lungs every 6 (six) hours as needed for wheezing or shortness of breath.  Marland Kitchen aspirin EC 81 MG tablet Take 81 mg by mouth daily.  . baclofen (LIORESAL) 10 MG tablet Take 10 mg by mouth 3 (three) times daily.  . cyclobenzaprine (FLEXERIL) 5 MG tablet Take 1 tablet (5 mg total) by mouth 2 (two) times daily as needed for up to 15 doses for muscle spasms.  . diclofenac sodium (VOLTAREN) 1 % GEL Apply 2 g topically 4 (four) times daily.  . ferrous sulfate 325 (65 FE) MG tablet Take 1 tablet (325 mg total) by mouth 3 (three) times daily with meals. (Patient taking differently: Take 325 mg by mouth daily. )  . metFORMIN (GLUCOPHAGE) 1000 MG tablet Take 1 tablet (1,000 mg total) by mouth 2 (two) times daily with a meal.  . metoCLOPramide (REGLAN) 10 MG tablet Take 1 tablet (10 mg total) by mouth every 6 (six) hours as needed for nausea (nausea/headache).  . ramipril (ALTACE) 2.5 MG capsule Take 1 capsule (2.5 mg total) by mouth daily.   No facility-administered encounter medications on file as of 04/25/2020.     Review of Systems  Review of Systems  Constitutional: Negative for activity change, fatigue and fever.  HENT: Negative for sinus pressure, sinus pain and sore throat.   Respiratory: Negative for cough, shortness of breath and wheezing.   Cardiovascular: Negative for chest pain and palpitations.  Gastrointestinal: Negative for diarrhea, nausea and vomiting.  Musculoskeletal: Negative for arthralgias.  Neurological: Negative  for dizziness.  Psychiatric/Behavioral: Negative for sleep disturbance. The patient is not nervous/anxious.      Physical Exam  BP 124/72   Pulse 77   Temp (!) 97.3 F (36.3 C) (Temporal)   Ht 5' 5.5" (1.664 m)   Wt 201 lb (91.2 kg)   SpO2 96% Comment: on RA  BMI 32.94 kg/m   Wt Readings from Last 5 Encounters:  04/25/20 201 lb (91.2 kg)  02/02/20 193 lb 9.6 oz (87.8 kg)  12/28/17 178 lb (80.7 kg)  07/15/17 186 lb 6.4 oz (84.6 kg)  07/02/17 206 lb 12.7 oz (93.8 kg)  BMI Readings from Last 5 Encounters:  04/25/20 32.94 kg/m  02/02/20 31.73 kg/m  12/28/17 28.73 kg/m  07/15/17 30.55 kg/m  07/02/17 34.41 kg/m     Physical Exam Vitals and nursing note reviewed.  Constitutional:      General: She is not in acute distress.    Appearance: Normal appearance. She is obese.  HENT:     Head: Normocephalic and atraumatic.     Right Ear: External ear normal.     Left Ear: External ear normal.  Cardiovascular:     Rate and Rhythm: Normal rate and regular rhythm.     Pulses: Normal pulses.     Heart sounds: Normal heart sounds. No murmur.  Pulmonary:     Effort: Pulmonary effort is normal. No respiratory distress.     Breath sounds: Normal breath sounds. No decreased air movement. No decreased breath sounds, wheezing or rales.  Musculoskeletal:     Cervical back: Normal range of motion.  Skin:    General: Skin is warm and dry.     Capillary Refill: Capillary refill takes less than 2 seconds.  Neurological:     General: No focal deficit present.     Mental Status: She is alert and oriented to person, place, and time. Mental status is at baseline.     Gait: Gait normal.  Psychiatric:        Mood and Affect: Mood normal.        Behavior: Behavior normal.        Thought Content: Thought content normal.        Judgment: Judgment normal.       Assessment & Plan:   OSA (obstructive sleep apnea) Moderate obstructive sleep apnea Doing well on CPAP Needs mask  fitting  Plan: We will coordinate mask fitting with [employer] We will check compliance in 2 months 19-month follow-up in our office  Former smoker Congratulated patient on stopping smoking Offered smoking cessation support today, patient declined Patient is aware to contact her office if she becomes interested  Plan: Continue follow-up in 6 months Continue to not smoke    Return in about 6 months (around 10/26/2020), or if symptoms worsen or fail to improve, for Follow up with Dr. Halford Chessman.   Lauraine Rinne, NP 04/25/2020   This appointment required 25 minutes of patient care (this includes precharting, chart review, review of results, face-to-face care, etc.).

## 2020-04-26 ENCOUNTER — Encounter: Payer: Self-pay | Admitting: Podiatry

## 2020-04-26 ENCOUNTER — Ambulatory Visit: Payer: Medicare HMO | Admitting: Podiatry

## 2020-04-26 ENCOUNTER — Other Ambulatory Visit: Payer: Self-pay

## 2020-04-26 DIAGNOSIS — M7661 Achilles tendinitis, right leg: Secondary | ICD-10-CM | POA: Diagnosis not present

## 2020-04-26 DIAGNOSIS — S86011A Strain of right Achilles tendon, initial encounter: Secondary | ICD-10-CM

## 2020-04-26 NOTE — Progress Notes (Signed)
She presents today for follow-up of her Achilles tendinitis of her right foot states that is really not any better at all that boot really messed up my left side make my back hurt and I did wear it long enough to be able to help my heel. She does state that the diclofenac gel seems to make it feel better.  Objective: She presents today in a pair of thin shoes. She has warmth on palpation of her Achilles at its insertion site on the posterior calcaneus. There is an area of fluctuance superficial to the Achilles consistent with a bursitis. She has pain on palpation of the Achilles.  Assessment: Probable Achilles bursitis cannot rule out a tear of the Achilles.  Plan: At this point we are requesting MRI of the Achilles. Also injected 2 mg of dexamethasone to the point of maximal tenderness subcutaneously making sure not to inject into the tendon itself. On follow-up with her as after the MRI.

## 2020-04-27 ENCOUNTER — Telehealth: Payer: Self-pay | Admitting: *Deleted

## 2020-04-27 DIAGNOSIS — M7661 Achilles tendinitis, right leg: Secondary | ICD-10-CM

## 2020-04-27 DIAGNOSIS — S86011A Strain of right Achilles tendon, initial encounter: Secondary | ICD-10-CM

## 2020-04-27 NOTE — Telephone Encounter (Signed)
-----   Message from Rip Harbour, [facility] sent at 04/27/2020  7:28 AM EDT ----- Regarding: MRI MRI right ankle - evaluate achilles tendon tear right - surgical consideration

## 2020-04-27 NOTE — Telephone Encounter (Signed)
Faxed orders, clinicals and demographics to [facility] for pre-cert and scheduling. 

## 2020-04-30 NOTE — Telephone Encounter (Signed)
[facility] [id number] AUTHORIZATION:  [id number], VALID FROM/TO 04/30/2020 THROUGH 10/27/2020.

## 2020-05-14 ENCOUNTER — Ambulatory Visit: Payer: Medicare HMO | Admitting: Neurology

## 2020-05-14 ENCOUNTER — Other Ambulatory Visit: Payer: Self-pay

## 2020-05-14 ENCOUNTER — Encounter: Payer: Self-pay | Admitting: Neurology

## 2020-05-14 VITALS — Ht 65.5 in | Wt 203.0 lb

## 2020-05-14 DIAGNOSIS — R42 Dizziness and giddiness: Secondary | ICD-10-CM | POA: Diagnosis not present

## 2020-05-14 DIAGNOSIS — R519 Headache, unspecified: Secondary | ICD-10-CM

## 2020-05-14 NOTE — Progress Notes (Addendum)
Subjective:    [name] [name] ID: [name] [name] [name] is a 74 y.o. female.  HPI     Star Age, MD, PhD [facility] [address] P.O[address] [place], [place] [address] Dear Dr. Luciana Axe,   I saw your [name] [name], [name] [name] [name], upon your kind request, in my Neurologic clinic today for initial consultation of her recurrent headaches and dizziness.  [name] [name] [name] is unaccompanied today. As you know, [name] [name] [name] is a 74 year old right-handed woman with an underlying medical history of anemia, arthritis, asthma, COPD, sleep apnea, diabetes, hypertension, UTI, and obesity, who reports recurrent headaches on [name] left side since she had a fall in mid February.  She reports that she fell at night when going to [name] bathroom, she was taking [name] Soma at [name] time which has since then been discontinued.  She reports at times a throbbing headache, sometimes it is more dull and achy, it has slightly improved in terms of frequency, she had a nearly daily headache in [name] beginning and it is now sometimes twice a week, sometimes once a week.  Initially, Tylenol helps but it stopped helping.  She is currently not taking anything.  She has some associated dizziness, sometimes it feels like a spinning sensation, denies any ringing in [name] ears but has a history of hearing loss and was told in 2019 to get hearing aids.  She recently had an eye examination and was told she had cataracts which they are monitoring.  She has a follow-up scheduled routinely for November this year.    I reviewed [name] office note from 04/05/2020.  She had a head CT wo contrast on 01/22/20, and I reviewed [name] results: IMPRESSION: Stable age related involutional changes of [name] brain without acute intracranial abnormality. 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  She had a fall at home in [name] bathroom in February 2021.  She went to [name] emergency room, she had a head CT wo contrast on 01/24/2020 and I  reviewed [name] results: IMPRESSION: Atrophy with small vessel chronic ischemic changes of deep cerebral white matter.  No acute intracranial abnormalities.  She had a cervical spine MRI without contrast on 02/02/2020 through ortho, and I reviewed [name] results: IMPRESSION: 1. 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 2. Moderate left C5 and C7 neural foraminal stenosis. 3. Mild spinal canal stenosis at C4-5.  She admits that she does not necessarily hydrate all that well with water, estimates that she drinks on average 1 bottle of water per day, 16 ounce size.  She likes to drink diet soda, 2-3 small bottles per day on average.  She quit smoking in March of this year and does not drink alcohol on a regular basis.  She lives alone, she has used a cane before, denies any sudden onset of one-sided weakness or numbness or tingling or droopy face or slurring of speech.  She has right foot pain and is scheduled for a scan of [name] right ankle.  Her Past Medical History Is Significant For: Past Medical History:  Diagnosis Date  . Anemia   . Arthritis   . Asthma   . COPD (chronic obstructive pulmonary disease) ([place])   . Diabetes mellitus without complication ([place])   . Headache   . Hypertension   . OSA (obstructive sleep apnea) 01/30/2018  . Pneumonia   . Sleep apnea   . UTI (urinary tract infection) 2018    Her Past Surgical History Is Significant For: Past  Surgical History:  Procedure Laterality Date  . CESAREAN SECTION    . COLONOSCOPY N/A 07/04/2017   Procedure: COLONOSCOPY;  Surgeon: Jerene Bears, MD;  Location: [facility];  Service: Gastroenterology;  Laterality: N/A;  . ESOPHAGOGASTRODUODENOSCOPY N/A 07/04/2017   Procedure: ESOPHAGOGASTRODUODENOSCOPY (EGD);  Surgeon: Jerene Bears, MD;  Location: [facility];  Service: Gastroenterology;  Laterality: N/A;    Her Family History Is Significant For: Family History  Problem Relation Age of Onset  . COPD Mother   . CAD Neg Hx   .  Diabetes Mellitus II Neg Hx     Her Social History Is Significant For: Social History   Socioeconomic History  . Marital status: Divorced    Spouse name: Not on file  . Number of children: Not on file  . Years of education: Not on file  . Highest education level: Not on file  Occupational History  . Not on file  Tobacco Use  . Smoking status: Former Smoker    Packs/day: 0.25    Types: Cigarettes    Start date: 12/22/1960    Quit date: 02/27/2020    Years since quitting: 0.2  . Smokeless tobacco: Never Used  Substance and Sexual Activity  . Alcohol use: Yes    Alcohol/week: 1.0 standard drinks    Types: 1 Glasses of wine per week  . Drug use: No  . Sexual activity: Not on file  Other Topics Concern  . Not on file  Social History Narrative  . Not on file   Social Determinants of Health   Financial Resource Strain:   . Difficulty of Paying Living Expenses:   Food Insecurity:   . Worried About Charity fundraiser in [name] Last Year:   . Arboriculturist in [name] Last Year:   Transportation Needs:   . Film/video editor (Medical):   Marland Kitchen Lack of Transportation (Non-Medical):   Physical Activity:   . Days of Exercise per Week:   . Minutes of Exercise per Session:   Stress:   . Feeling of Stress :   Social Connections:   . Frequency of Communication with Friends and Family:   . Frequency of Social Gatherings with Friends and Family:   . Attends Religious Services:   . Active Member of Clubs or Organizations:   . Attends Archivist Meetings:   Marland Kitchen Marital Status:     Her Allergies Are:  No Known Allergies:   Her Current Medications Are:  Outpatient Encounter Medications as of 05/14/2020  Medication Sig  . albuterol (PROVENTIL HFA;VENTOLIN HFA) 108 (90 Base) MCG/ACT inhaler Inhale 2 puffs into [name] lungs every 6 (six) hours as needed for wheezing or shortness of breath.  Marland Kitchen aspirin EC 81 MG tablet Take 81 mg by mouth daily.  . baclofen (LIORESAL) 10 MG tablet Take  10 mg by mouth 3 (three) times daily.  . cyclobenzaprine (FLEXERIL) 5 MG tablet Take 1 tablet (5 mg total) by mouth 2 (two) times daily as needed for up to 15 doses for muscle spasms.  . diclofenac sodium (VOLTAREN) 1 % GEL Apply 2 g topically 4 (four) times daily.  . ferrous sulfate 325 (65 FE) MG tablet Take 1 tablet (325 mg total) by mouth 3 (three) times daily with meals. ([name] [name] taking differently: Take 325 mg by mouth daily. )  . metFORMIN (GLUCOPHAGE) 1000 MG tablet Take 1 tablet (1,000 mg total) by mouth 2 (two) times daily with a meal.  . metoCLOPramide (REGLAN) 10  MG tablet Take 1 tablet (10 mg total) by mouth every 6 (six) hours as needed for nausea (nausea/headache).  . ramipril (ALTACE) 2.5 MG capsule Take 1 capsule (2.5 mg total) by mouth daily.   No facility-administered encounter medications on file as of 05/14/2020.  :   Review of Systems:  Out of a complete 14 point review of systems, all are reviewed and negative with [name] exception of these symptoms as listed below:  Review of Systems  Neurological:       Here to discuss worsening dizziness. Pt reports when she changes positions (sitting to standing) she will feel very dizzy and sometimes falls. Last fall was back in March. She also sts if dizziness persists for very long her head will start to throb .    Objective:  Neurological Exam  Physical Exam Physical Examination:   There were no vitals filed for this visit.   On orthostatic testing she has no significant drop of her blood pressure, denies any orthostatic lightheadedness and spinning sensation currently.  General Examination: [name] [name] [name] is a very pleasant 74 y.o. female in no acute distress. She appears well-developed and well-nourished and well groomed.   HEENT: Normocephalic, atraumatic, pupils are equal, round and reactive to light and accommodation.  Mild proptosis noted. Funduscopic exam is difficult due to bilateral cataracts noted.  Extraocular  tracking is good without limitation to gaze excursion or nystagmus noted. Normal smooth pursuit is noted. Hearing is grossly intact. Tympanic membranes are clear bilaterally. Face is symmetric with normal facial animation and normal facial sensation. Speech is clear with no dysarthria noted. There is no hypophonia. There is no lip, neck/head, jaw or voice tremor. Neck is supple with full range of passive and active motion. There are no carotid bruits on auscultation. Oropharynx exam reveals: mild mouth dryness, adequate dental hygiene and marked airway crowding. Tongue protrudes centrally and palate elevates symmetrically.  Chest: Clear to auscultation without wheezing, rhonchi or crackles noted.  Heart: S1+S2+0, regular and normal without murmurs, rubs or gallops noted.   Abdomen: Soft, non-tender and non-distended with normal bowel sounds appreciated on auscultation.  Extremities: There is no pitting edema in [name] distal lower extremities bilaterally. Pedal pulses are intact.  Skin: Warm and dry without trophic changes noted.  Musculoskeletal: exam reveals no obvious joint deformities, tenderness or joint swelling or erythema.   Neurologically:  Mental status: [name] [name] [name] is awake, alert and oriented in all 4 spheres. Her immediate and remote memory, attention, language skills and fund of knowledge are appropriate. There is no evidence of aphasia, agnosia, apraxia or anomia. Speech is clear with normal prosody and enunciation. Thought process is linear. Mood is normal and affect is normal.  Cranial nerves II - XII are as described above under HEENT exam. In addition: shoulder shrug is normal with equal shoulder height noted. Motor exam: Normal bulk, strength and tone is noted. There is no drift, tremor or rebound. Reflexes are 1+ throughout. Babinski: Toes are flexor bilaterally. Fine motor skills and coordination: intact with normal finger taps, normal hand movements, normal rapid alternating  patting, normal foot taps and normal foot agility.  Cerebellar testing: No dysmetria or intention tremor on finger to nose testing. Heel to shin is unremarkable bilaterally. There is no truncal or gait ataxia.  Sensory exam: intact to light touch, vibration, temperature sense in [name] upper and lower extremities.  Gait, station and balance: She stands easily. No veering to one side is noted. No leaning to one side is  noted. Posture is age-appropriate and stance is initially slightly wider base but she is able to stand narrow base.  She walks with preserved arm swing, no shuffling, slightly slowly and cautiously.     Assessment and plan:  In summary, [name] [name] [name] is a very pleasant 74 y.o.-year old female with an underlying medical history of anemia, arthritis, asthma, COPD, sleep apnea, diabetes, hypertension, UTI, and obesity, who with an underlying medical history of anemia, arthritis, asthma, COPD, sleep apnea, diabetes, hypertension, UTI, and obesity, who presents for evaluation of her recurrent headaches and dizziness.  Examination shows no orthostatic hypotension and she has no orthostatic lightheadedness or vertiginous symptoms today.  Neurological exam is nonfocal.  She reports that her headache started after her fall.  She had a head CT and a recent neck MRI.  She reports that her headaches are little bit better and that she has not had any daily headaches recently.  She may have had posttraumatic headaches.  She is on [name] mend from what I can tell.  She is largely reassured today.  She may not always hydrate well enough and is encouraged to drink about 3-4 bottles of water per day on average, 16 ounce each.  She reports compliance with her CPAP machine.  She is advised that given [name] positional nature and intermittent nature of her dizziness I would like for her to have an ultrasound of [name] carotid arteries.  She is agreeable to this.  We will call her to schedule her carotid Doppler ultrasound and call  her with her results.  For as needed use for her headaches she is encouraged to use Advil over-[name]-counter or Tylenol.  She is advised that certain medications could be contributing to her dizziness.  She is advised to try to avoid sedating medications including her Flexeril, baclofen and Belsomra.  She uses [name] muscle relaxers only if needed for lower back spasms and has not been taking [name] Belsomra since [name] fall she reports.  She is advised to make sure that her thyroid function is tested at [name] next appointment with you.  I felt that she may have mild proptosis.  She is advised to follow-up with you on a scheduled basis.  She is encouraged to talk to you about a referral to ENT if she has persistent issues with her hearing and positional spinning sensation in keeping with vertigo. We  will keep her posted with regards to her carotid Doppler results and as long as [name] results are benign she can follow-up with me on an as-needed basis. I answered all her questions today and [name] [name] [name] was in agreement.  Thank you very much for allowing me to participate in [name] care of this nice [name] [name]. If I can be of any further assistance to you please do not hesitate to call me at [phone].  Sincerely,   Star Age, MD, PhD

## 2020-05-14 NOTE — Patient Instructions (Addendum)
Your neurological exam is normal thankfully, I do not believe you need additional imaging testing of your brain.  You do not drop your blood pressure with change in positions. Please make sure that you get your thyroid function checked when you go back to see your primary care physician. If you continue to have issues with your hearing and spinning sensations, you may benefit from seeing ENT, and ear, nose and throat specialist. Please continue follow-up with your eye doctor as you do have cataracts. I think you could try to hydrate a little better with water, please try to drink at least 3 bottles, if not 4 bottles of water per day, 16 ounce each.  Please avoid sedating medications including your muscle relaxers including baclofen and Flexeril.  Avoid sleep aids, as you fell at night. I would like to proceed with a ultrasound of your main neck arteries, called carotid Doppler test.  We will call you to schedule this test and keep you posted about the test results by phone call.  If you test is benign, I can see you back as needed.  You had a recent CT scan of the head.

## 2020-05-24 ENCOUNTER — Ambulatory Visit
Admission: RE | Admit: 2020-05-24 | Discharge: 2020-05-24 | Disposition: A | Discharge status: Home / Self Care | Payer: Medicare HMO | Source: Ambulatory Visit | Attending: Podiatry | Admitting: Podiatry

## 2020-05-24 ENCOUNTER — Other Ambulatory Visit: Payer: Self-pay

## 2020-05-24 DIAGNOSIS — S86011A Strain of right Achilles tendon, initial encounter: Secondary | ICD-10-CM

## 2020-05-24 DIAGNOSIS — M7661 Achilles tendinitis, right leg: Secondary | ICD-10-CM

## 2020-05-25 ENCOUNTER — Telehealth: Payer: Self-pay | Admitting: *Deleted

## 2020-05-25 NOTE — Telephone Encounter (Signed)
I informed pt of Dr. Stephenie Acres request to send copy of MRI disc to a radiology specialist for more details for treatment planning, there would be a 10-14 day delay in the final results, once received we would call with instructions. Pt states understanding. Faxed request for MRI disc copy to [facility].

## 2020-05-25 NOTE — Telephone Encounter (Signed)
-----   Message from Garrel Ridgel, [place] sent at 05/25/2020  9:48 AM EDT ----- Please send for over read and inform patient of the delay.

## 2020-05-31 NOTE — Telephone Encounter (Signed)
Received copy of MRI disc and mailed to [facility]. 

## 2020-06-28 ENCOUNTER — Telehealth: Payer: Self-pay | Admitting: Pulmonary Disease

## 2020-06-28 ENCOUNTER — Ambulatory Visit: Payer: Medicare HMO | Admitting: Podiatry

## 2020-06-28 NOTE — Telephone Encounter (Signed)
Pt returning call. States call was dropped. Can be reached at [phone]

## 2020-06-28 NOTE — Telephone Encounter (Signed)
06/28/2020  Please contact the patient let her know that I have reviewed her most recent CPAP compliance report.  Is showing a better controlled AHI.  As well as less leaks.  This is good news.  If the patient has issues with using her CPAP she can schedule a follow-up with our office.  Keep scheduled follow-up.   Wyn Quaker, FNP

## 2020-06-28 NOTE — Telephone Encounter (Signed)
Contacted patient with results of recent CPAP compliance report. Patient verbalized understanding and is scheduled for follow up in November 2021. Patient is not having any issues or concerns with her CPAP therapy.

## 2020-06-29 ENCOUNTER — Encounter: Payer: Self-pay | Admitting: Podiatry

## 2020-07-13 ENCOUNTER — Telehealth: Payer: Self-pay | Admitting: *Deleted

## 2020-07-13 NOTE — Telephone Encounter (Signed)
Left message informing pt Dr. Milinda Pointer had reviewed her results and wanted me to remind her to keep her appt of 07/31/2020 1:15pm.

## 2020-07-13 NOTE — Telephone Encounter (Signed)
-----   Message from Garrel Ridgel, [place] sent at 07/04/2020  7:33 AM EDT ----- Have her in for surgical discussion.

## 2020-07-31 ENCOUNTER — Ambulatory Visit (INDEPENDENT_AMBULATORY_CARE_PROVIDER_SITE_OTHER): Payer: Medicare HMO | Admitting: Podiatry

## 2020-07-31 ENCOUNTER — Encounter: Payer: Self-pay | Admitting: Podiatry

## 2020-07-31 ENCOUNTER — Other Ambulatory Visit: Payer: Self-pay

## 2020-07-31 DIAGNOSIS — M7661 Achilles tendinitis, right leg: Secondary | ICD-10-CM

## 2020-07-31 DIAGNOSIS — M722 Plantar fascial fibromatosis: Secondary | ICD-10-CM | POA: Diagnosis not present

## 2020-08-01 NOTE — Progress Notes (Signed)
She presents today for follow-up of her MRI for her right foot.  She states that the pain is the same there is been no relenting of the Achilles pain.  Objective: Vital signs are stable alert oriented x3.  Pulses are palpable.  We did go over the MRI results today which do demonstrate interstitial tearing 15% at least.  She still has pain on palpation to the area of the back is still warm to the touch.  She also has some plantar fasciitis as well.  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  Plan: We discussed etiology pathology conservative surgical therapies at this point time we did discuss surgery in great detail however she does not want to go that route at this point.  The only other thing that we have to offer her would be physical therapy I expressed to her that there is no guarantee that this would cure her symptoms.  She understands and is minimal toe movement but would like to try.  We will get her set up for [facility].

## 2020-08-02 ENCOUNTER — Telehealth: Payer: Self-pay | Admitting: *Deleted

## 2020-08-02 DIAGNOSIS — S86011A Strain of right Achilles tendon, initial encounter: Secondary | ICD-10-CM

## 2020-08-02 DIAGNOSIS — M722 Plantar fascial fibromatosis: Secondary | ICD-10-CM

## 2020-08-02 DIAGNOSIS — M7661 Achilles tendinitis, right leg: Secondary | ICD-10-CM

## 2020-08-02 NOTE — Telephone Encounter (Signed)
-----   Message from Rip Harbour, [facility] sent at 08/02/2020  8:34 AM EDT ----- Regarding: PT PT -   Patient states she would like a PT facility near [facility] preferred  Evaluate and treat for achilles tendonitis/plantar fasciitis bilateral   Duration: 3 x week x 4 weeks

## 2020-10-16 ENCOUNTER — Encounter: Payer: Self-pay | Admitting: Family Medicine

## 2020-10-16 ENCOUNTER — Ambulatory Visit: Payer: Medicare HMO | Admitting: Family Medicine

## 2020-10-16 ENCOUNTER — Telehealth: Payer: Self-pay | Admitting: Family Medicine

## 2020-10-16 VITALS — BP 112/69 | HR 61 | Ht 65.0 in | Wt 200.0 lb

## 2020-10-16 DIAGNOSIS — R519 Headache, unspecified: Secondary | ICD-10-CM

## 2020-10-16 DIAGNOSIS — R42 Dizziness and giddiness: Secondary | ICD-10-CM | POA: Diagnosis not present

## 2020-10-16 DIAGNOSIS — G8929 Other chronic pain: Secondary | ICD-10-CM | POA: Diagnosis not present

## 2020-10-16 NOTE — Patient Instructions (Addendum)
Below is our plan:  We will have staff reach out to you to schedule carotid doppler as previously advised by Dr Rexene Alberts. Please listen out for a call to schedule this procedure. Please contact PCP to discuss referral to ENT for evaluation of spinning sensation. May consider vestibular therapy with a physical therapist based if recommended by PCP.  Recent imaging and neurologic exam are normal. I do not see any obvious causes of symptoms. Please make sure you know what medications you are taking, how frequently you are taking them and at what dose. Please confirm with PCP. I would advise not taking baclofen and cyclobenzaprine together. Gabapentin can also cause similar symptoms of dizziness. If symptoms worsened after starting this medication, please discuss with provider witting this medication.   Please make sure you are staying well hydrated. I recommend 50-60 ounces daily. Well balanced diet and regular exercise encouraged.   Please continue follow up with care team as directed.   We will follow up pending results of carotid doppler.   You may receive a survey regarding today's visit. I encourage you to leave honest feed back as I do use this information to improve patient care. Thank you for seeing me today!       General Headache Without Cause A headache is pain or discomfort that is felt around the head or neck area. There are many causes and types of headaches. In some cases, the cause may not be found. Follow these instructions at home: Watch your condition for any changes. Let your doctor know about them. Take these steps to help with your condition: Managing pain      Take over-the-counter and prescription medicines only as told by your doctor.  Lie down in a dark, quiet room when you have a headache.  If told, put ice on your head and neck area: ? Put ice in a plastic bag. ? Place a towel between your skin and the bag. ? Leave the ice on for 20 minutes, 2-3 times per  day.  If told, put heat on the affected area. Use the heat source that your doctor recommends, such as a moist heat pack or a heating pad. ? Place a towel between your skin and the heat source. ? Leave the heat on for 20-30 minutes. ? Remove the heat if your skin turns bright red. This is very important if you are unable to feel pain, heat, or cold. You may have a greater risk of getting burned.  Keep lights dim if bright lights bother you or make your headaches worse. Eating and drinking  Eat meals on a regular schedule.  If you drink alcohol: ? Limit how much you use to:  0-1 drink a day for women.  0-2 drinks a day for men. ? Be aware of how much alcohol is in your drink. In the [place] one drink equals one 12 oz bottle of beer (355 mL), one 5 oz glass of wine (148 mL), or one 1 oz glass of hard liquor (44 mL).  Stop drinking caffeine, or reduce how much caffeine you drink. General instructions   Keep a journal to find out if certain things bring on headaches. For example, write down: ? What you eat and drink. ? How much sleep you get. ? Any change to your diet or medicines.  Get a massage or try other ways to relax.  Limit stress.  Sit up straight. Do not tighten (tense) your muscles.  Do not use any products  that contain nicotine or tobacco. This includes cigarettes, e-cigarettes, and chewing tobacco. If you need help quitting, ask your doctor.  Exercise regularly as told by your doctor.  Get enough sleep. This often means 7-9 hours of sleep each night.  Keep all follow-up visits as told by your doctor. This is important. Contact a doctor if:  Your symptoms are not helped by medicine.  You have a headache that feels different than the other headaches.  You feel sick to your stomach (nauseous) or you throw up (vomit).  You have a fever. Get help right away if:  Your headache gets very bad quickly.  Your headache gets worse after a lot of physical  activity.  You keep throwing up.  You have a stiff neck.  You have trouble seeing.  You have trouble speaking.  You have pain in the eye or ear.  Your muscles are weak or you lose muscle control.  You lose your balance or have trouble walking.  You feel like you will pass out (faint) or you pass out.  You are mixed up (confused).  You have a seizure. Summary  A headache is pain or discomfort that is felt around the head or neck area.  There are many causes and types of headaches. In some cases, the cause may not be found.  Keep a journal to help find out what causes your headaches. Watch your condition for any changes. Let your doctor know about them.  Contact a doctor if you have a headache that is different from usual, or if your headache is not helped by medicine.  Get help right away if your headache gets very bad, you throw up, you have trouble seeing, you lose your balance, or you have a seizure. This information is not intended to replace advice given to you by your health care provider. Make sure you discuss any questions you have with your health care provider. Document Revised: 06/28/2018 Document Reviewed: 06/28/2018 Elsevier Patient Education  [place].

## 2020-10-16 NOTE — Progress Notes (Deleted)
PATIENT: [name] [name] DOB: [birth year]  REASON FOR VISIT: follow up HISTORY FROM: patient  Virtual Visit via Telephone Note  I connected with [name] [name] on 10/16/20 at 11:30 AM EDT by telephone and verified that I am speaking with the correct person using two identifiers.   I discussed the limitations, risks, security and privacy concerns of performing an evaluation and management service by telephone and the availability of in person appointments. I also discussed with the patient that there may be a patient responsible charge related to this service. The patient expressed understanding and agreed to proceed.   History of Present Illness:  10/16/20 [name] [name] is a 74 y.o. female here today for follow up. She was last seen by Dr Rexene Alberts for recurrent headaches and spinning sensation thought to be due to inadequate hydration and possible medication side effects. Symptoms started after a fall in 12/2019. CT head 12/2019 showed atrophy and small vessel ischemia. Carotid doppler ordered by was not completed.       History (copied from my note on 05/14/2020)   Dear Dr. Luciana Axe,   I saw your patient, Rozlyn Yerby, upon your kind request, in my Neurologic clinic today for initial consultation of her recurrent headaches and dizziness.  The patient is unaccompanied today. As you know, Ms Calkin is a 74 year old right-handed woman with an underlying medical history of anemia, arthritis, asthma, COPD, sleep apnea, diabetes, hypertension, UTI, and obesity, who reports recurrent headaches on the left side since she had a fall in mid February.  She reports that she fell at night when going to the bathroom, she was taking the Soma at the time which has since then been discontinued.  She reports at times a throbbing headache, sometimes it is more dull and achy, it has slightly improved in terms of frequency, she had a nearly daily headache in the beginning and it is now sometimes twice a week, sometimes once a  week.  Initially, Tylenol helps but it stopped helping.  She is currently not taking anything.  She has some associated dizziness, sometimes it feels like a spinning sensation, denies any ringing in the ears but has a history of hearing loss and was told in 2019 to get hearing aids.  She recently had an eye examination and was told she had cataracts which they are monitoring.  She has a follow-up scheduled routinely for November this year.    I reviewed the office note from 04/05/2020.  She had a head CT wo contrast on 01/22/20, and I reviewed the results: IMPRESSION: Stable age related involutional changes of the brain without acute intracranial abnormality. 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  She had a fall at home in the bathroom in February 2021.  She went to the emergency room, she had a head CT wo contrast on 01/24/2020 and I reviewed the results: IMPRESSION: Atrophy with small vessel chronic ischemic changes of deep cerebral white matter.  No acute intracranial abnormalities.  She had a cervical spine MRI without contrast on 02/02/2020 through ortho, and I reviewed the results: IMPRESSION: 1. 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 2. Moderate left C5 and C7 neural foraminal stenosis. 3. Mild spinal canal stenosis at C4-5.  She admits that she does not necessarily hydrate all that well with water, estimates that she drinks on average 1 bottle of water per day, 16 ounce size.  She likes to drink diet soda, 2-3 small bottles per day  on average.  She quit smoking in March of this year and does not drink alcohol on a regular basis.  She lives alone, she has used a cane before, denies any sudden onset of one-sided weakness or numbness or tingling or droopy face or slurring of speech.  She has right foot pain and is scheduled for a scan of the right ankle.   Observations/Objective:  Generalized: Well developed, in no acute  distress  Mentation: Alert oriented to time, place, history taking. Follows all commands speech and language fluent   Assessment and Plan:  74 y.o. year old female  has a past medical history of Anemia, Arthritis, Asthma, COPD (chronic obstructive pulmonary disease) ([place]), Diabetes mellitus without complication ([place]), Headache, Hypertension, OSA (obstructive sleep apnea) (01/30/2018), Pneumonia, Sleep apnea, and UTI (urinary tract infection) (2018). here with  No diagnosis found.  No orders of the defined types were placed in this encounter.   No orders of the defined types were placed in this encounter.    Follow Up Instructions:  I discussed the assessment and treatment plan with the patient. The patient was provided an opportunity to ask questions and all were answered. The patient agreed with the plan and demonstrated an understanding of the instructions.   The patient was advised to call back or seek an in-person evaluation if the symptoms worsen or if the condition fails to improve as anticipated.  I provided *** minutes of non-face-to-face time during this encounter.   Debbora Presto, NP

## 2020-10-16 NOTE — Progress Notes (Addendum)
Chief Complaint  Patient presents with  . Follow-up    rm 1- head making a swishing sound she she moves her head.     HISTORY OF PRESENT ILLNESS: Today 10/16/20  [name] [name] is a 74 y.o. female here today for follow up. She was last seen by [name] [name] for recurrent headaches and spinning sensation thought to be due to inadequate hydration and possible medication side effects. Symptoms started after a fall in 12/2019. CT head 12/2019 showed atrophy and small vessel ischemia. Carotid doppler ordered by was not completed.   Patient is a poor historian and having a difficult time describing how she is feeling. She reports that she continues to have a "spinning" or "swooshing" sensation of her head. She reports that she has a throbbing sensation when this happens. Symptoms last for a few seconds then resolve. She does not feel that this is the same as a headache. This event happens multiple times a day, every day. Symptoms worsened 1 month ago. Symptoms only occur when she looks left, right, up or down. No dizziness or imbalance. No injuries or falls. She drinks about a gallon of water daily. She did see audiology and now has hearing aids. She reports that she has not heard back to schedule carotid doppler. She did not reach out to PCP regarding ENT evaluation. She is very unclear about timing and dosage of baclofen or cyclobenzaprine. Initially she says that she takes PRN but when I point to the medication on med list she says that she is taking both every day. Unclear how often or if they are taken together. She reports being started on a new pain medication about 2 months ago, she thinks it may be gabapentin. She is uncertain of dose.    She denies cardiac symptoms. No vision changes. She reports BP and pulse are "always good". She denies dizziness or imbalance, especially when standing. Orthostatic BP stable with previous evaluation.    History (copied from my note on 05/14/2020)   Dear [name]. Luciana [name],    I saw your patient, [name] [name], upon your kind request, in my Neurologic clinic today for initial consultation of her recurrent headaches and dizziness.  The patient is unaccompanied today. As you know, [name] is a 74 year old right-handed woman with an underlying medical history of anemia, arthritis, asthma, COPD, sleep apnea, diabetes, hypertension, UTI, and obesity, who reports recurrent headaches on the left side since she had a fall in mid February.  She reports that she fell at night when going to the bathroom, she was taking the Soma at the time which has since then been discontinued.  She reports at times a throbbing headache, sometimes it is more dull and achy, it has slightly improved in terms of frequency, she had a nearly daily headache in the beginning and it is now sometimes twice a week, sometimes once a week.  Initially, Tylenol helps but it stopped helping.  She is currently not taking anything.  She has some associated dizziness, sometimes it feels like a spinning sensation, denies any ringing in the ears but has a history of hearing loss and was told in 2019 to get hearing aids.  She recently had an eye examination and was told she had cataracts which they are monitoring.  She has a follow-up scheduled routinely for November this year.    I reviewed the office note from 04/05/2020.  She had a head CT wo contrast on 01/22/20, and I reviewed the  results: IMPRESSION: Stable age related involutional changes of the brain without acute intracranial abnormality. 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  She had a fall at home in the bathroom in February 2021.  She went to the emergency room, she had a head CT wo contrast on 01/24/2020 and I reviewed the results: IMPRESSION: Atrophy with small vessel chronic ischemic changes of deep cerebral white matter.  No acute intracranial abnormalities.  She had a cervical spine MRI without contrast  on 02/02/2020 through ortho, and I reviewed the results: IMPRESSION: 1. 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 2. Moderate left C5 and C7 neural foraminal stenosis. 3. Mild spinal canal stenosis at C4-5.  She admits that she does not necessarily hydrate all that well with water, estimates that she drinks on average 1 bottle of water per day, 16 ounce size.  She likes to drink diet soda, 2-3 small bottles per day on average.  She quit smoking in March of this year and does not drink alcohol on a regular basis.  She lives alone, she has used a cane before, denies any sudden onset of one-sided weakness or numbness or tingling or droopy face or slurring of speech.  She has right foot pain and is scheduled for a scan of the right ankle.    REVIEW OF SYSTEMS: Out of a complete 14 system review of symptoms, the patient complains only of the following symptoms, intermittent throbbing pain of head, unusual sensation when moving head, and all other reviewed systems are negative.   ALLERGIES: No Known Allergies   HOME MEDICATIONS: Outpatient Medications Prior to Visit  Medication Sig Dispense Refill  . albuterol (PROVENTIL HFA;VENTOLIN HFA) 108 (90 Base) MCG/ACT inhaler Inhale 2 puffs into the lungs every 6 (six) hours as needed for wheezing or shortness of breath. 3 Inhaler 0  . aspirin EC 81 MG tablet Take 81 mg by mouth daily.    . baclofen (LIORESAL) 10 MG tablet Take 10 mg by mouth 3 (three) times daily.    . cyclobenzaprine (FLEXERIL) 5 MG tablet Take 1 tablet (5 mg total) by mouth 2 (two) times daily as needed for up to 15 doses for muscle spasms. 15 tablet 0  . diclofenac sodium (VOLTAREN) 1 % GEL Apply 2 g topically 4 (four) times daily. 100 g 0  . ferrous sulfate 325 (65 FE) MG tablet Take 1 tablet (325 mg total) by mouth 3 (three) times daily with meals. (Patient taking differently: Take 325 mg by mouth daily. ) 90 tablet 0  . metFORMIN (GLUCOPHAGE) 1000 MG tablet Take 1 tablet  (1,000 mg total) by mouth 2 (two) times daily with a meal. 60 tablet 0  . ramipril (ALTACE) 2.5 MG capsule Take 1 capsule (2.5 mg total) by mouth daily. 30 capsule 0  . metoCLOPramide (REGLAN) 10 MG tablet Take 1 tablet (10 mg total) by mouth every 6 (six) hours as needed for nausea (nausea/headache). 12 tablet 0   No facility-administered medications prior to visit.     PAST MEDICAL HISTORY: Past Medical History:  Diagnosis Date  . Anemia   . Arthritis   . Asthma   . COPD (chronic obstructive pulmonary disease) ([place])   . Diabetes mellitus without complication ([place])   . Headache   . Hypertension   . OSA (obstructive sleep apnea) 01/30/2018  . Pneumonia   . Sleep apnea   . UTI (urinary tract infection) 2018     PAST SURGICAL HISTORY: Past Surgical  History:  Procedure Laterality Date  . CESAREAN SECTION    . COLONOSCOPY N/A 07/04/2017   Procedure: COLONOSCOPY;  Surgeon: Jerene Bears, MD;  Location: [facility];  Service: Gastroenterology;  Laterality: N/A;  . ESOPHAGOGASTRODUODENOSCOPY N/A 07/04/2017   Procedure: ESOPHAGOGASTRODUODENOSCOPY (EGD);  Surgeon: Jerene Bears, MD;  Location: [facility];  Service: Gastroenterology;  Laterality: N/A;     FAMILY HISTORY: Family History  Problem Relation Age of Onset  . COPD Mother   . CAD Neg Hx   . Diabetes Mellitus II Neg Hx      SOCIAL HISTORY: Social History   Socioeconomic History  . Marital status: Divorced    Spouse name: Not on file  . Number of children: Not on file  . Years of education: Not on file  . Highest education level: Not on file  Occupational History  . Not on file  Tobacco Use  . Smoking status: Former Smoker    Packs/day: 0.25    Types: Cigarettes    Start date: 12/22/1960    Quit date: 02/27/2020    Years since quitting: 0.6  . Smokeless tobacco: Never Used  Vaping Use  . Vaping Use: Former  Substance and Sexual Activity  . Alcohol use: Yes    Alcohol/week: 1.0 standard drink    Types: 1  Glasses of wine per week  . Drug use: No  . Sexual activity: Not on file  Other Topics Concern  . Not on file  Social History Narrative  . Not on file   Social Determinants of Health   Financial Resource Strain:   . Difficulty of Paying Living Expenses: Not on file  Food Insecurity:   . Worried About Charity fundraiser in the Last Year: Not on file  . Ran Out of Food in the Last Year: Not on file  Transportation Needs:   . Lack of Transportation (Medical): Not on file  . Lack of Transportation (Non-Medical): Not on file  Physical Activity:   . Days of Exercise per Week: Not on file  . Minutes of Exercise per Session: Not on file  Stress:   . Feeling of Stress : Not on file  Social Connections:   . Frequency of Communication with Friends and Family: Not on file  . Frequency of Social Gatherings with Friends and Family: Not on file  . Attends Religious Services: Not on file  . Active Member of Clubs or Organizations: Not on file  . Attends Archivist Meetings: Not on file  . Marital Status: Not on file  Intimate Partner Violence:   . Fear of Current or Ex-Partner: Not on file  . Emotionally Abused: Not on file  . Physically Abused: Not on file  . Sexually Abused: Not on file      PHYSICAL EXAM  Vitals:   10/16/20 1046  BP: 112/69  Pulse: 61  Weight: 200 lb (90.7 kg)  Height: 5\' 5"  (1.651 m)   Body mass index is 33.28 kg/m.   Generalized: Well developed, in no acute distress   Neurological examination  Mentation: Alert oriented to time, place, history taking. Follows all commands speech and language fluent Cranial nerve II-XII: Pupils were equal round reactive to light. Extraocular movements were full, visual field were full on confrontational test. Facial sensation and strength were normal. Head turning and shoulder shrug  were normal and symmetric. No nystagmus. Symptoms are not reproducible with exam today.  Motor: The motor testing reveals 5 over 5  strength  of all 4 extremities. Good symmetric motor tone is noted throughout.  Sensory: Sensory testing is intact to soft touch on all 4 extremities. No evidence of extinction is noted.  Coordination: Cerebellar testing reveals good finger-nose-finger and heel-to-shin bilaterally.  Gait and station: Gait is normal.  Reflexes: Deep tendon reflexes are symmetric and normal bilaterally.     DIAGNOSTIC DATA (LABS, IMAGING, TESTING) - I reviewed patient records, labs, notes, testing and imaging myself where available.  Lab Results  Component Value Date   WBC 8.1 01/19/2019   HGB 12.4 01/19/2019   HCT 39.5 01/19/2019   MCV 96.6 01/19/2019   PLT 214 01/19/2019      Component Value Date/Time   NA 140 01/19/2019 1324   K 4.0 01/19/2019 1324   CL 105 01/19/2019 1324   CO2 26 01/19/2019 1324   GLUCOSE 163 (H) 01/19/2019 1324   BUN 10 01/19/2019 1324   CREATININE 0.77 01/19/2019 1324   CALCIUM 9.5 01/19/2019 1324   PROT 6.2 (L) 06/29/2017 1710   ALBUMIN 2.8 (L) 06/29/2017 1710   AST 34 06/29/2017 1710   ALT 20 06/29/2017 1710   ALKPHOS 58 06/29/2017 1710   BILITOT 0.7 06/29/2017 1710   GFRNONAA >60 01/19/2019 1324   GFRAA >60 01/19/2019 1324   Lab Results  Component Value Date   CHOL 184 01/11/2010   HDL 46 01/11/2010   LDLCALC 113 (H) 01/11/2010   TRIG 125 01/11/2010   CHOLHDL 4.0 Ratio 01/11/2010   Lab Results  Component Value Date   HGBA1C 6.8 (H) 07/01/2017   Lab Results  Component Value Date   VITAMINB12 335 06/29/2017   Lab Results  Component Value Date   TSH 1.176 12/28/2009      ASSESSMENT AND PLAN  74 y.o. year old female  has a past medical history of Anemia, Arthritis, Asthma, COPD (chronic obstructive pulmonary disease) ([place]), Diabetes mellitus without complication ([place]), Headache, Hypertension, OSA (obstructive sleep apnea) (01/30/2018), Pneumonia, Sleep apnea, and UTI (urinary tract infection) (2018). here with   Head pain, chronic  Dizziness of  unknown etiology  Alan returns today with reports of worsening head pain and unusual sensation in her head when turning side to side or looking up and down.  Symptoms are not reproducible today on exam.  Neuro examination is unremarkable.  When she was seen in May by [name]. Rexene [name], she was advised to have carotid Doppler for evaluation.  This has not been performed.  I will reach out to our staff to assist her in scheduling ultrasound.  I have also reviewed with her [name]. Silvana Newness recommendation for an ENT evaluation.  May consider vestibular therapy.  Unfortunately, she is a poor historian and it is difficult to know what medication she is taking, at what doses and what time.  Symptoms seem to have worsened after starting a medication that she thinks was gabapentin.  I have advised that she discuss this with primary care.  She was encouraged to keep an updated medication list with her at all times stating doses and timing of each medication.  We have discussed treating head pain with headache preventative.  She does not feel a medication will be beneficial as the pain only last for couple seconds.  Pain does seem to occur multiple times a day, every day.  We will consider headache prevention medication in the future pending carotid Doppler and ENT evaluation.  She was encouraged to continue adequate hydration.  Healthy, well-balanced diet regular exercise encouraged.  She will  follow-up as needed.   I spent 35 minutes of face-to-face and non-face-to-face time with patient.  This included previsit chart review, lab review, study review, order entry, electronic health record documentation, patient education.    Debbora Presto, MSN, FNP-C 10/16/2020, 12:49 PM  [facility] [address], [place], [place] [address] [phone]  I reviewed the above note and documentation by the Nurse Practitioner and agree with the history, exam, assessment and plan as outlined above. I was available for  consultation. Star Age, MD, PhD [facility])

## 2020-10-16 NOTE — Telephone Encounter (Signed)
Can you see if we can get her back in for carotid doppler based on Dr Guadelupe Sabin last note and order. She reports that she did not hear back to schedule.

## 2020-10-17 NOTE — Telephone Encounter (Signed)
[name] from [facility] will patient to get her Doppler scheduled .

## 2020-10-22 ENCOUNTER — Ambulatory Visit (HOSPITAL_COMMUNITY)
Admission: RE | Admit: 2020-10-22 | Discharge: 2020-10-22 | Disposition: A | Discharge status: Home / Self Care | Payer: Medicare HMO | Source: Ambulatory Visit | Attending: Neurology | Admitting: Neurology

## 2020-10-22 ENCOUNTER — Other Ambulatory Visit: Payer: Self-pay

## 2020-10-22 DIAGNOSIS — R519 Headache, unspecified: Secondary | ICD-10-CM | POA: Diagnosis present

## 2020-10-22 DIAGNOSIS — R42 Dizziness and giddiness: Secondary | ICD-10-CM

## 2020-10-22 NOTE — Progress Notes (Signed)
Please advise patient that the recent carotid Doppler study which is the ultrasound of the main neck arteries, was negative for any significant stenosis, that is tightness of the arteries. No further action is required at this time of this test. She can FU with PCP.   Star Age, MD, PhD [facility])

## 2020-10-24 ENCOUNTER — Telehealth: Payer: Self-pay

## 2020-10-24 NOTE — Telephone Encounter (Signed)
I called pt. No answer, left a message asking her to call me back.

## 2020-10-24 NOTE — Telephone Encounter (Signed)
Pt returned my call. I discussed the carotid US results and recommendations with her. She will follow up with Dr. Luciana Axe. Pt verbalized understanding of results. Pt had no questions at this time but was encouraged to call back if questions arise.

## 2020-10-24 NOTE — Telephone Encounter (Signed)
-----   Message from Star Age, MD sent at 10/22/2020  3:45 PM EDT ----- Please advise patient that the recent carotid Doppler study which is the ultrasound of the main neck arteries, was negative for any significant stenosis, that is tightness of the arteries. No further action is required at this time of this test. She can FU with PCP.   Star Age, MD, PhD [facility])

## 2020-10-29 ENCOUNTER — Telehealth: Payer: Self-pay | Admitting: Pulmonary Disease

## 2020-10-29 NOTE — Telephone Encounter (Signed)
I am not seeing where anyone from our office had tried to contact pt.  Called and spoke with pt to see if she knew who had tried calling her and she said when she went to pick the phone up, she ended the call instead. Looked at pt's last OV and it said she was to f/u in 6 months so that could've been for her follow up based off of recall. I have scheduled pt a 6 month follow up appt. Nothing further needed.

## 2020-11-21 ENCOUNTER — Other Ambulatory Visit: Payer: Self-pay

## 2020-11-21 ENCOUNTER — Ambulatory Visit: Payer: Medicare HMO | Admitting: Pulmonary Disease

## 2020-11-21 ENCOUNTER — Encounter: Payer: Self-pay | Admitting: Pulmonary Disease

## 2020-11-21 VITALS — BP 118/62 | HR 80 | Temp 98.6°F | Ht 66.0 in | Wt 201.0 lb

## 2020-11-21 DIAGNOSIS — Z9989 Dependence on other enabling machines and devices: Secondary | ICD-10-CM

## 2020-11-21 DIAGNOSIS — G4733 Obstructive sleep apnea (adult) (pediatric): Secondary | ICD-10-CM

## 2020-11-21 NOTE — Patient Instructions (Signed)
Will have [employer] change your auto CPAP to 9 to 18 cm water pressure  Follow up in 1 year

## 2020-11-21 NOTE — Progress Notes (Signed)
[name]  Chief Complaint  Patient presents with  . Follow-up    No complaints    Constitutional:  BP 118/62 (BP Location: Left Arm, Cuff Size: Normal)   Pulse 80   Temp 98.6 F (37 C) (Other (Comment)) Comment (Src): wrist  Ht 5\' 6"  (1.676 m)   Wt 201 lb (91.2 kg)   SpO2 99% Comment: Room air  BMI 32.44 kg/m   Past Medical History:  Spinal stenosis, DM, HA, HTN, PNA, Asthma  Past Surgical History:  Her  has a past surgical history that includes Cesarean section; Esophagogastroduodenoscopy (N/A, 07/04/2017); and Colonoscopy (N/A, 07/04/2017).  Brief Summary:  [name] [name] is a 74 y.o. female smoker with obstructive sleep apnea.      Subjective:   She uses CPAP nightly.  Got a new full face mask and fits better.  Not having as much air leak.  Gets stuffy nose sometimes.  Feels like pressure isn't high enough sometimes.  Physical Exam:   Appearance - well kempt   ENMT - no sinus tenderness, no oral exudate, no LAN, Mallampati 4 airway, no stridor  Respiratory - equal breath sounds bilaterally, no wheezing or rales  CV - s1s2 regular rate and rhythm, no murmurs  Ext - no clubbing, no edema  Skin - no rashes  Psych - normal mood and affect   Sleep Tests:   HST 01/12/18 >> AHI 22.1, SaO2 low 71%.  Auto CPAP 10/21/20 to 11/19/20 >> used on 30 of 30 nights with average 7 hrs 25 min.  Average AHI 4.8 with median CPAP 13 and 95 th percentile CPAP 15 cm H2O  Social History:  She  reports that she has been smoking cigarettes. She started smoking about 59 years ago. She has been smoking about 0.25 packs per day. She has never used smokeless tobacco. She reports current alcohol use of about 1.0 standard drink of alcohol per week. She reports that she does not use drugs.  Family History:  Her family history includes COPD in her mother.     Assessment/Plan:   Obstructive sleep apnea. - she is compliant with CPAP and  reports benefit from therapy - she uses [employer] for her DME - will change her auto CPAP to 9 to 18 cm H2O - advised her to increase humidity setting to see if this improves nasal stuffiness  Tobacco abuse. - more stressors at home and started smoking couple cigarettes per day again - she plans to quit on her own  Time Spent Involved in Patient Care on Day of Examination:  21 minutes  Follow up:  Patient Instructions  Will have [employer] change your auto CPAP to 9 to 18 cm water pressure  Follow up in 1 year   Medication List:   Allergies as of 11/21/2020   No Known Allergies     Medication List       Accurate as of November 21, 2020 12:28 PM. If you have any questions, ask your nurse or doctor.        STOP taking these medications   baclofen 10 MG tablet Commonly known as: LIORESAL Stopped by: Chesley Mires, MD   cyclobenzaprine 5 MG tablet Commonly known as: FLEXERIL Stopped by: Chesley Mires, MD     TAKE these medications   albuterol 108 (90 Base) MCG/ACT inhaler Commonly known as: VENTOLIN HFA Inhale 2 puffs into the lungs every 6 (six) hours as needed for wheezing or shortness of breath.  aspirin EC 81 MG tablet Take 81 mg by mouth daily.   diclofenac sodium 1 % Gel Commonly known as: VOLTAREN Apply 2 g topically 4 (four) times daily.   ferrous sulfate 325 (65 FE) MG tablet Take 1 tablet (325 mg total) by mouth 3 (three) times daily with meals. What changed: when to take this   metFORMIN 1000 MG tablet Commonly known as: GLUCOPHAGE Take 1 tablet (1,000 mg total) by mouth 2 (two) times daily with a meal.   ramipril 2.5 MG capsule Commonly known as: ALTACE Take 1 capsule (2.5 mg total) by mouth daily.       Signature:  Chesley Mires, MD [place] Pager - [phone] 11/21/2020, 12:28 PM

## 2020-12-10 ENCOUNTER — Ambulatory Visit: Payer: Medicare HMO | Admitting: Podiatry

## 2020-12-10 ENCOUNTER — Other Ambulatory Visit: Payer: Self-pay

## 2020-12-10 ENCOUNTER — Encounter: Payer: Self-pay | Admitting: Podiatry

## 2020-12-10 DIAGNOSIS — M7661 Achilles tendinitis, right leg: Secondary | ICD-10-CM | POA: Diagnosis not present

## 2020-12-10 NOTE — Progress Notes (Signed)
Subjective:  Patient ID: [name] [name], female    DOB: 23-May-1946,  MRN: [id number]  Chief Complaint  Patient presents with  . Tendonitis    "Im still having a lot of pain in my achilles and a lot of swelling.  The physical therapy is not helping"    74 y.o. female presents with the above complaint. History confirmed with patient.  She previously has been seeing Dr. Milinda Pointer for this.  MRI over the summer showed interstitial tearing in the Achilles tendon.  She was hesitant to proceed with surgery.  He recommended physical therapy which she has been doing.  Has not really helped much.  Pain is about the same.  Objective:  Physical Exam: warm, good capillary refill, no trophic changes or ulcerative lesions, normal DP and PT pulses and normal sensory exam.  Right Foot: She has pain in the mid substance of the Achilles tendon but none in the insertion   Reviewed her radiographs from 02/16/2020: There is a very tiny posterior calcaneal enthesophyte as well as a Haglund's deformity  Study Result  Narrative & Impression  CLINICAL DATA:  Posterior ankle pain and swelling since March. Concern for Achilles tendon injury.  EXAM: MRI OF THE RIGHT ANKLE WITHOUT CONTRAST  TECHNIQUE: Multiplanar, multisequence MR imaging of the ankle was performed. No intravenous contrast was administered.  COMPARISON:  Right foot x-rays dated February 16, 2020.  FINDINGS: TENDONS  Peroneal: Peroneal longus tendon intact. Peroneal brevis intact.  Posteromedial: Posterior tibial tendon intact. Flexor hallucis longus tendon intact. Flexor digitorum longus tendon intact.  Anterior: Tibialis anterior tendon intact. Extensor hallucis longus tendon intact Extensor digitorum longus tendon intact.  Achilles: Intact with mild to moderate distal tendinosis. No tear. Small amount of fluid in the retrocalcaneal bursa.  Plantar Fascia: Intact.  LIGAMENTS  Lateral: Anterior talofibular ligament intact.  Calcaneofibular ligament intact. Posterior talofibular ligament intact. Anterior and posterior tibiofibular ligaments intact.  Medial: Deltoid ligament intact. Spring ligament intact. Lisfranc ligament intact.  CARTILAGE  Ankle Joint: No joint effusion. Normal ankle mortise. Focal cartilage loss over the posterior tibial plafond with underlying subchondral marrow edema and cystic change.  Subtalar Joints/Sinus Tarsi: Normal subtalar joints. No subtalar joint effusion. Normal sinus tarsi.  Bones: No suspicious marrow signal abnormality. No fracture or dislocation.  Soft Tissue: Mild soft tissue swelling over the distal Achilles tendon. No soft tissue mass or fluid collection.  IMPRESSION: 1. Mild to moderate distal Achilles tendinosis without tear. Mild retrocalcaneal bursitis. 2. Mild tibiotalar osteoarthritis.   Electronically Signed   By: Titus Dubin M.D.   On: 05/24/2020 19:13    Assessment:   1. Achilles tendinitis of right lower extremity      Plan:  Patient was evaluated and treated and all questions answered.   Discussed further treatment options with her.  PT has not been helpful.  She would like to do something beyond what she is currently doing.  Discussed open surgical debridement and resection of the Haglund's deformity which I do not think she is ready to go through with.  I do think today most of her pain is in the mid substance of the Achilles tendon.  Its possible that microdebridement of this with radiofrequency ablation could offer some relief.  Discussed the risks and benefits of this with her.  The advantage of this would not require any large incisions.  She is a well-controlled diabetic with an A1c of 6.7%.  Informed consent was signed and reviewed for Topaz procedure for the  right Achilles tendon.  Postoperative course reviewed.  She will bring her CAM boot to surgery.  She met the surgery scheduler to schedule.   Surgical  plan:  Procedure: -Right Achilles radiofrequency ablation microtenotomy  Location: -[facility]  Anesthesia plan: -IV sedation with local  Postoperative pain plan: - Tylenol 1000 mg every 6 hours, ibuprofen 600 mg every 6 hours, tramadol 50 mg every 6 hours as needed  DVT prophylaxis: -None required  WB Restrictions / DME needs: -WBAT in short CAM boot which she will bring   No follow-ups on file.

## 2020-12-25 ENCOUNTER — Other Ambulatory Visit: Payer: Self-pay

## 2020-12-25 ENCOUNTER — Ambulatory Visit: Payer: Medicare HMO | Admitting: Podiatry

## 2020-12-25 DIAGNOSIS — M7661 Achilles tendinitis, right leg: Secondary | ICD-10-CM

## 2020-12-25 NOTE — Progress Notes (Signed)
She presents today for second opinion regarding Achilles tendon surgery to the right lower extremity.  She saw Dr. Lilian Kapur the last time she was in and he recommended and signed her up with a consent for Topaz procedure.  None at this point she is wondering whether this is the right procedure for her.  Objective: Vital signs are stable she alert oriented x3 her tendon is less edematous and not quite as tender as it has been in the past.  There is no longer fluctuance within the tendon sheath or in the posterior aspect of the heel.  I reviewed the MRI which did demonstrate only a tendinosis and no tears.  Assessment: Achilles tendinosis chronic right.  Plan: I explained to her that I think a Topaz procedure would work best for her particularly for this chronic condition so at this point she is scheduled for mid January and she will continue on with the surgery plan.

## 2021-01-01 ENCOUNTER — Telehealth: Payer: Self-pay

## 2021-01-01 NOTE — Telephone Encounter (Signed)
DOS 01/11/2021  PERANTANEOUS ACHILLES DEBRIDEMENT RT - 41962  [employer] EFFECTIVE DATE - 12/22/2020  PLAN DEDUCTIBLE - $0.00 OUT OF POCKET - $3400.00 W/ $3400.00 REMAINING COPAY $50.00 COINSURANCE - 100%  SPOKE TO ERIC AT [facility], HE STATED NO PRECERT REQUIRED FOR COT 22979. CALL REF # [id number]

## 2021-01-11 ENCOUNTER — Other Ambulatory Visit: Payer: Self-pay | Admitting: Podiatry

## 2021-01-11 DIAGNOSIS — M7661 Achilles tendinitis, right leg: Secondary | ICD-10-CM

## 2021-01-11 MED ORDER — IBUPROFEN 800 MG PO TABS: 800.0000 mg | ORAL_TABLET | Freq: Three times a day (TID) | ORAL | 0 refills | Status: AC | PRN

## 2021-01-11 MED ORDER — ACETAMINOPHEN 500 MG PO TABS: 1000.0000 mg | ORAL_TABLET | Freq: Four times a day (QID) | ORAL | 0 refills | Status: AC | PRN

## 2021-01-11 NOTE — Progress Notes (Signed)
R Achilles Topaz

## 2021-01-17 ENCOUNTER — Other Ambulatory Visit: Payer: Self-pay

## 2021-01-17 ENCOUNTER — Ambulatory Visit (INDEPENDENT_AMBULATORY_CARE_PROVIDER_SITE_OTHER): Payer: Medicare HMO | Admitting: Podiatry

## 2021-01-17 DIAGNOSIS — M7661 Achilles tendinitis, right leg: Secondary | ICD-10-CM

## 2021-01-18 ENCOUNTER — Encounter: Payer: Medicare HMO | Admitting: Podiatry

## 2021-01-20 ENCOUNTER — Encounter: Payer: Self-pay | Admitting: Podiatry

## 2021-01-20 NOTE — Progress Notes (Signed)
  Subjective:  Patient ID: [name] [name], female    DOB: [birth year],  MRN: [id number]  Chief Complaint  Patient presents with  . Routine Post Op    PT stated that she is doing good she has no major concerns she stated she does have a little bit of time     DOS: 01/11/21 Procedure: Topaz of R Achilles  75 y.o. female returns for post-op check. Feeling well  Review of Systems: Negative except as noted in the HPI. Denies N/V/F/Ch.   Objective:  There were no vitals filed for this visit. There is no height or weight on file to calculate BMI. Constitutional Well developed. Well nourished.  Vascular Foot warm and well perfused. Capillary refill normal to all digits.   Neurologic Normal speech. Oriented to person, place, and time. Epicritic sensation to light touch grossly present bilaterally.  Dermatologic Skin healing well without signs of infection. Skin edges well coapted without signs of infection.  Orthopedic: Tenderness to palpation noted about the surgical site.    Assessment:  No diagnosis found. Plan:  Patient was evaluated and treated and all questions answered.  S/p foot surgery right -Progressing as expected post-operatively. -Can begin bathing -Resume PT with eccentric therapy plan. Referral sent to [facility]  Return in about 5 weeks (around 02/21/2021) for re-check Achilles tendon.

## 2021-01-24 ENCOUNTER — Encounter: Payer: Medicare HMO | Admitting: Podiatry

## 2021-01-25 ENCOUNTER — Encounter: Payer: Medicare HMO | Admitting: Podiatry

## 2021-02-07 ENCOUNTER — Telehealth: Payer: Self-pay | Admitting: Podiatry

## 2021-02-07 ENCOUNTER — Encounter: Payer: Medicare HMO | Admitting: Podiatry

## 2021-02-07 NOTE — Telephone Encounter (Signed)
The patient left a message asking [place] to get in touch with her insurance company so she can go to physical therapy.

## 2021-02-21 ENCOUNTER — Other Ambulatory Visit: Payer: Self-pay

## 2021-02-21 ENCOUNTER — Ambulatory Visit (INDEPENDENT_AMBULATORY_CARE_PROVIDER_SITE_OTHER): Payer: Medicare HMO | Admitting: Podiatry

## 2021-02-21 DIAGNOSIS — M7661 Achilles tendinitis, right leg: Secondary | ICD-10-CM

## 2021-02-22 ENCOUNTER — Encounter: Payer: Self-pay | Admitting: Podiatry

## 2021-02-22 NOTE — Progress Notes (Signed)
  Subjective:  Patient ID: [name] [name], female    DOB: 1946/10/26,  MRN: [id number]  Chief Complaint  Patient presents with  . Routine Post Op    PT stated that she is doing well she has no major concerns at this time. She stated that every now and then she will get a pain    DOS: 01/11/21 Procedure: Topaz of R Achilles  75 y.o. female returns for post-op check. Feeling well.  She did not start physical therapy, the referral did not process.  She has been doing her own exercises at home that she learned in preop physical therapy  Review of Systems: Negative except as noted in the HPI. Denies N/V/F/Ch.   Objective:  There were no vitals filed for this visit. There is no height or weight on file to calculate BMI. Constitutional Well developed. Well nourished.  Vascular Foot warm and well perfused. Capillary refill normal to all digits.   Neurologic Normal speech. Oriented to person, place, and time. Epicritic sensation to light touch grossly present bilaterally.  Dermatologic  skin well-healed  Orthopedic:  Minimal pain posterior heel    Assessment:  No diagnosis found. Plan:  Patient was evaluated and treated and all questions answered.  S/p foot surgery right -She is doing quite well.  At this point she can continue her own home exercise plan she is doing very good job well.  We will [place] reevaluate her in 6 weeks  Return in about 6 weeks (around 04/04/2021) for re-check Achilles tendon.

## 2021-03-08 ENCOUNTER — Other Ambulatory Visit: Payer: Self-pay

## 2021-03-08 ENCOUNTER — Other Ambulatory Visit: Payer: Self-pay | Admitting: Family Medicine

## 2021-03-08 ENCOUNTER — Ambulatory Visit
Admission: RE | Admit: 2021-03-08 | Discharge: 2021-03-08 | Disposition: A | Discharge status: Home / Self Care | Payer: Medicare HMO | Source: Ambulatory Visit | Attending: Family Medicine | Admitting: Family Medicine

## 2021-03-08 DIAGNOSIS — R053 Chronic cough: Secondary | ICD-10-CM

## 2021-04-04 ENCOUNTER — Other Ambulatory Visit: Payer: Self-pay

## 2021-04-04 ENCOUNTER — Ambulatory Visit (INDEPENDENT_AMBULATORY_CARE_PROVIDER_SITE_OTHER): Payer: Medicare HMO | Admitting: Podiatry

## 2021-04-04 ENCOUNTER — Encounter: Payer: Self-pay | Admitting: Podiatry

## 2021-04-04 DIAGNOSIS — M722 Plantar fascial fibromatosis: Secondary | ICD-10-CM

## 2021-04-04 DIAGNOSIS — M7661 Achilles tendinitis, right leg: Secondary | ICD-10-CM

## 2021-04-04 NOTE — Progress Notes (Signed)
  Subjective:  Patient ID: [name] [name], female    DOB: [birth year],  MRN: [id number]  Chief Complaint  Patient presents with  . Routine Post Op    6 week f/u Return in about 6 weeks (around 04/04/2021) for re-check Achilles tendon  POV #3 DOS 01/11/2021 PERANTANEOUS ACHILLES DEBRIDEMENT OF RT LEG    DOS: 01/11/21 Procedure: Topaz of R Achilles  75 y.o. female returns for post-op check.  Overall doing quite well she been doing her exercises and only has intermittent pain and is ready to go back to work  Review of Systems: Negative except as noted in the HPI. Denies N/V/F/Ch.   Objective:  There were no vitals filed for this visit. There is no height or weight on file to calculate BMI. Constitutional Well developed. Well nourished.  Vascular Foot warm and well perfused. Capillary refill normal to all digits.   Neurologic Normal speech. Oriented to person, place, and time. Epicritic sensation to light touch grossly present bilaterally.  Dermatologic  skin well-healed  Orthopedic:  Minimal pain posterior heel    Assessment:   1. Achilles tendinitis of right lower extremity   2. Plantar fasciitis, right    Plan:  Patient was evaluated and treated and all questions answered.  S/p foot surgery right -She is doing quite well.  Continue home exercises and may return to work  Return in about 3 months (around 07/04/2021) for re-check Achilles tendon.

## 2021-05-08 ENCOUNTER — Ambulatory Visit: Payer: Medicare HMO | Admitting: Cardiology

## 2021-05-08 ENCOUNTER — Encounter: Payer: Self-pay | Admitting: Cardiology

## 2021-05-08 ENCOUNTER — Other Ambulatory Visit: Payer: Self-pay

## 2021-05-08 VITALS — BP 118/67 | HR 79 | Temp 98.0°F | Resp 17 | Ht 65.0 in | Wt 201.0 lb

## 2021-05-08 DIAGNOSIS — I2510 Atherosclerotic heart disease of native coronary artery without angina pectoris: Secondary | ICD-10-CM | POA: Insufficient documentation

## 2021-05-08 DIAGNOSIS — I25118 Atherosclerotic heart disease of native coronary artery with other forms of angina pectoris: Secondary | ICD-10-CM

## 2021-05-08 NOTE — Progress Notes (Signed)
Patient referred by Bartholome Bill, MD for coronary artery disease  Subjective:   [name] [name], female    DOB: 1946/03/04, 75 y.o.   MRN: [id number]   Chief Complaint  Patient presents with  . Coronary Artery Disease  . New Patient (Initial Visit)     HPI  75 y.o. African American female with type 2 DM, former smoker, COPD, asthma, coronary artery disease, exertional dyspnea  Patient works at a group home. She does not do much physical activity outside of work. She states she feels sleepy "all the time". She reports exertional dyspnea with minimal activity since 01/2021. She denies chest pain. She is a former smoker, quit in 12/2020. She smoked 2-3 cigarettes most of her life. She was previously on metformin for diabetes, which was switched to Januvia by her PCP. However patient could not afford it. As a result she is now on no diabetes medications, presently.   She recently underwent CT chest for lung cancer screening, which showed coronary atherosclerosis.     Past Medical History:  Diagnosis Date  . Anemia   . Arthritis   . Asthma   . COPD (chronic obstructive pulmonary disease) ([place])   . Diabetes mellitus without complication ([place])   . Headache   . Hypertension   . OSA (obstructive sleep apnea) 01/30/2018  . Pneumonia   . Sleep apnea   . UTI (urinary tract infection) 2018     Past Surgical History:  Procedure Laterality Date  . CESAREAN SECTION    . COLONOSCOPY N/A 07/04/2017   Procedure: COLONOSCOPY;  Surgeon: Jerene Bears, MD;  Location: [facility];  Service: Gastroenterology;  Laterality: N/A;  . ESOPHAGOGASTRODUODENOSCOPY N/A 07/04/2017   Procedure: ESOPHAGOGASTRODUODENOSCOPY (EGD);  Surgeon: Jerene Bears, MD;  Location: [facility];  Service: Gastroenterology;  Laterality: N/A;     Social History   Tobacco Use  Smoking Status Current Every Day Smoker  . Packs/day: 0.25  . Types: Cigarettes  . Start date: 12/22/1960  Smokeless Tobacco Never Used   Tobacco Comment   smokes 2 cigarettes per day-11/21/2020    Social History   Substance and Sexual Activity  Alcohol Use Yes  . Alcohol/week: 1.0 standard drink  . Types: 1 Glasses of wine per week     Family History  Problem Relation Age of Onset  . COPD Mother   . CAD Neg Hx   . Diabetes Mellitus II Neg Hx      Current Outpatient Medications on File Prior to Visit  Medication Sig Dispense Refill  . albuterol (PROVENTIL HFA;VENTOLIN HFA) 108 (90 Base) MCG/ACT inhaler Inhale 2 puffs into the lungs every 6 (six) hours as needed for wheezing or shortness of breath. 3 Inhaler 0  . diclofenac sodium (VOLTAREN) 1 % GEL Apply 2 g topically 4 (four) times daily. 100 g 0  . ferrous sulfate 325 (65 FE) MG tablet Take 1 tablet (325 mg total) by mouth 3 (three) times daily with meals. (Patient taking differently: Take 325 mg by mouth daily.) 90 tablet 0  . metFORMIN (GLUCOPHAGE) 1000 MG tablet Take 1 tablet (1,000 mg total) by mouth 2 (two) times daily with a meal. 60 tablet 0  . escitalopram (LEXAPRO) 20 MG tablet Take 1 tablet by mouth daily.     No current facility-administered medications on file prior to visit.    Cardiovascular and other pertinent studies:  EKG 05/08/2021: Sinus rhythm 74 bpm Low voltage  CT Chest 04/15/2021: 1. There is a  5 mm nodule in the right upper lobe in a 2 mm nodule  in the left lower lobe. No follow-up needed if patient is low-risk.  Non-contrast chest CT can be considered in 12 months if patient is  high-risk. This recommendation follows the consensus statement:  Guidelines for Management of Incidental Pulmonary Nodules Detected  on CT Images: From the Fleischner Society 2017; Radiology 2017;  284:228-243.  2. Mild atherosclerotic changes in the nonaneurysmal aorta.  3. Calcified atherosclerosis in the LAD.  4. No other abnormalities.   Aortic Atherosclerosis (ICD10-I70.0).   Recent labs: 04/19/2021: Glucose 281, BUN/Cr 16/0.94. EGFR 65. Na/K  136/4.2. Rest of the CMP normal HbA1C 8.3% Lipid panel N/A TSH N/A  03/2020: H/H 12/38. MCV 98. Platelets 188   Review of Systems  Constitutional: Positive for malaise/fatigue.  Cardiovascular: Positive for dyspnea on exertion. Negative for chest pain, leg swelling, palpitations and syncope.         Vitals:   05/08/21 1309  BP: 118/67  Pulse: 79  Resp: 17  Temp: 98 F (36.7 C)  SpO2: 96%     Body mass index is 33.45 kg/m. Filed Weights   05/08/21 1309  Weight: 201 lb (91.2 kg)     Objective:   Physical Exam Vitals and nursing note reviewed.  Constitutional:      General: She is not in acute distress. Neck:     Vascular: No JVD.  Cardiovascular:     Rate and Rhythm: Normal rate and regular rhythm.     Pulses: Normal pulses.     Heart sounds: Normal heart sounds. No murmur heard.   Pulmonary:     Effort: Pulmonary effort is normal.     Breath sounds: Normal breath sounds. No wheezing or rales.  Musculoskeletal:     Right lower leg: No edema.     Left lower leg: No edema.         Assessment & Recommendations:   75 y.o. African American female with type 2 DM, former smoker, COPD, asthma, coronary artery disease, exertional dyspnea   Exertional dyspnea: Likely multifactorial. She has asthma/COPD, as well calcified atherosclerosis in LAD, as seen on CT chest.  Recommend exercise nuclear stress test and echocardiogram. Check lipid panel. Encouraged to talk top PCP re: Diabetes management as well as generalized fatigue and sleepiness.  Further recommendations after above testing  Thank you for referring the patient to [place]. Please feel free to contact with any questions.   Nigel Mormon, MD Pager: [phone] Office: [phone]

## 2021-06-03 ENCOUNTER — Other Ambulatory Visit: Payer: Self-pay

## 2021-06-03 ENCOUNTER — Ambulatory Visit: Payer: Medicare HMO

## 2021-06-03 DIAGNOSIS — I25118 Atherosclerotic heart disease of native coronary artery with other forms of angina pectoris: Secondary | ICD-10-CM

## 2021-06-03 NOTE — Progress Notes (Signed)
Called pt no answer, left a vm

## 2021-06-04 LAB — LIPID PANEL
Chol/HDL Ratio: 3.2 ratio (ref 0.0–4.4)
Cholesterol, Total: 189 mg/dL (ref 100–199)
HDL: 59 mg/dL (ref >39)
LDL Chol Calc (NIH): 107 mg/dL — ABNORMAL HIGH (ref 0–99)
Triglycerides: 133 mg/dL (ref 0–149)
VLDL Cholesterol Cal: 23 mg/dL (ref 5–40)

## 2021-06-04 NOTE — Progress Notes (Signed)
Called and spoke to pt. Pt voiced understanding.

## 2021-06-10 ENCOUNTER — Ambulatory Visit: Payer: Medicare HMO | Admitting: Cardiology

## 2021-06-12 ENCOUNTER — Ambulatory Visit: Payer: Medicare HMO

## 2021-06-12 ENCOUNTER — Other Ambulatory Visit: Payer: Self-pay

## 2021-06-12 DIAGNOSIS — I25118 Atherosclerotic heart disease of native coronary artery with other forms of angina pectoris: Secondary | ICD-10-CM

## 2021-06-16 DIAGNOSIS — E782 Mixed hyperlipidemia: Secondary | ICD-10-CM | POA: Insufficient documentation

## 2021-06-16 NOTE — Progress Notes (Signed)
Patient referred by Bartholome Bill, MD for coronary artery disease  Subjective:   [name] [name], female    DOB: 1946-09-16, 76 y.o.   MRN: [id number]   Chief Complaint  Patient presents with   Coronary Artery Disease   Follow-up   Results    Stress, echo    HPI  75 y.o. African American female with type 2 DM, former smoker, COPD, asthma, coronary artery disease, exertional dyspnea  Test results discussed with the patient, details below. Patient reports generalized fatigue and sleepiness. She has not had her CPAP pressures titrated in a while.   Initial consultation HPI: Patient works at a group home. She does not do much physical activity outside of work. She states she feels sleepy "all the time". She reports exertional dyspnea with minimal activity since 01/2021. She denies chest pain. She is a former smoker, quit in 12/2020. She smoked 2-3 cigarettes most of her life. She was previously on metformin for diabetes, which was switched to Januvia by her PCP. However patient could not afford it. As a result she is now on no diabetes medications, presently.   She recently underwent CT chest for lung cancer screening, which showed coronary atherosclerosis.     Current Outpatient Medications on File Prior to Visit  Medication Sig Dispense Refill   albuterol (PROVENTIL HFA;VENTOLIN HFA) 108 (90 Base) MCG/ACT inhaler Inhale 2 puffs into the lungs every 6 (six) hours as needed for wheezing or shortness of breath. 3 Inhaler 0   diclofenac sodium (VOLTAREN) 1 % GEL Apply 2 g topically 4 (four) times daily. 100 g 0   escitalopram (LEXAPRO) 20 MG tablet Take 1 tablet by mouth daily.     ferrous sulfate 325 (65 FE) MG tablet Take 1 tablet (325 mg total) by mouth 3 (three) times daily with meals. (Patient taking differently: Take 325 mg by mouth daily.) 90 tablet 0   metFORMIN (GLUCOPHAGE) 1000 MG tablet Take 1 tablet (1,000 mg total) by mouth 2 (two) times daily with a meal. 60 tablet 0    No current facility-administered medications on file prior to visit.    Cardiovascular and other pertinent studies:  Echocardiogram 06/12/2021:  Normal LV systolic function with visual EF 55-60%. Left ventricle cavity  is normal in size. Normal global wall motion. Normal diastolic filling  pattern, normal LAP.  Mild (Grade I) aortic regurgitation.  Mild tricuspid regurgitation. No evidence of pulmonary hypertension.  Mild pulmonic regurgitation.  No prior study for comparison.  Exercise Tetrofosmin stress test 06/03/2021: Exercise nuclear stress test was performed using Bruce protocol. Patient reached 4.8 METS, and 84% of age predicted maximum heart rate. Exercise capacity was low. No chest pain reported. Heart rate and hemodynamic response were normal. Stress EKG revealed no ischemic changes. Normal myocardial perfusion. Stress LVEF 60%. Low risk study.  EKG 05/08/2021: Sinus rhythm 74 bpm Low voltage  CT Chest 04/15/2021: 1. There is a 5 mm nodule in the right upper lobe in a 2 mm nodule  in the left lower lobe. No follow-up needed if patient is low-risk.  Non-contrast chest CT can be considered in 12 months if patient is  high-risk. This recommendation follows the consensus statement:  Guidelines for Management of Incidental Pulmonary Nodules Detected  on CT Images: From the Fleischner Society 2017; Radiology 2017;  284:228-243.  2. Mild atherosclerotic changes in the nonaneurysmal aorta.  3. Calcified atherosclerosis in the LAD.  4. No other abnormalities.   Aortic Atherosclerosis (ICD10-I70.0).  Recent labs: 06/03/2021: Chol 189, TG 133, HDL 59, LDL 107  04/19/2021: Glucose 281, BUN/Cr 16/0.94. EGFR 65. Na/K 136/4.2. Rest of the CMP normal HbA1C 8.3% Lipid panel N/A TSH N/A  03/2020: H/H 12/38. MCV 98. Platelets 188   Review of Systems  Constitutional: Positive for malaise/fatigue.  Cardiovascular:  Positive for dyspnea on exertion. Negative for chest pain,  leg swelling, palpitations and syncope.        Vitals:   06/17/21 1350  BP: 133/70  Pulse: 83  Resp: 16  Temp: (!) 97.3 F (36.3 C)  SpO2: 97%    Body mass index is 34.11 kg/m. Filed Weights   06/17/21 1350  Weight: 205 lb (93 kg)     Objective:   Physical Exam Vitals and nursing note reviewed.  Constitutional:      General: She is not in acute distress. Neck:     Vascular: No JVD.  Cardiovascular:     Rate and Rhythm: Normal rate and regular rhythm.     Pulses: Normal pulses.     Heart sounds: Normal heart sounds. No murmur heard. Pulmonary:     Effort: Pulmonary effort is normal.     Breath sounds: Normal breath sounds. No wheezing or rales.  Musculoskeletal:     Right lower leg: No edema.     Left lower leg: No edema.        Assessment & Recommendations:   75 y.o. African American female with type 2 DM, former smoker, COPD, asthma, coronary artery disease, exertional dyspnea  Exertional dyspnea: Likely multifactorial. She has asthma/COPD, CPAP, as well calcified atherosclerosis in LAD, as seen on CT chest.  Echocardiogram with mild valvular regurgitations (05/2021) Exercise nuclear stress test with low exercise capacity but no EKG or SPECT evidence of ischemia (04/2021) Encouraged to talk top PCP re: Diabetes management as well as generalized fatigue and sleepiness. Encouraged to wear CPAP regularly.   CAD: Nonobstructive. Recommend Crestor 10 mg daily.  F/u w/PCP. Recommend uptitration to 20 mg, if LDL remains >70  Further recommendations after above testing  Thank you for referring the patient to [place]. Please feel free to contact with any questions.   Nigel Mormon, MD Pager: [phone] Office: [phone]

## 2021-06-17 ENCOUNTER — Encounter: Payer: Self-pay | Admitting: Cardiology

## 2021-06-17 ENCOUNTER — Other Ambulatory Visit: Payer: Self-pay

## 2021-06-17 ENCOUNTER — Ambulatory Visit: Payer: Medicare HMO | Admitting: Cardiology

## 2021-06-17 VITALS — BP 133/70 | HR 83 | Temp 97.3°F | Resp 16 | Ht 65.0 in | Wt 205.0 lb

## 2021-06-17 DIAGNOSIS — I25118 Atherosclerotic heart disease of native coronary artery with other forms of angina pectoris: Secondary | ICD-10-CM

## 2021-06-17 DIAGNOSIS — E782 Mixed hyperlipidemia: Secondary | ICD-10-CM

## 2021-06-17 MED ORDER — ROSUVASTATIN CALCIUM 10 MG PO TABS: 10.0000 mg | ORAL_TABLET | Freq: Every day | ORAL | 1 refills | Status: AC

## 2021-07-04 ENCOUNTER — Encounter: Payer: Medicare HMO | Admitting: Podiatry

## 2021-07-08 ENCOUNTER — Other Ambulatory Visit: Payer: Self-pay

## 2021-07-08 ENCOUNTER — Ambulatory Visit (INDEPENDENT_AMBULATORY_CARE_PROVIDER_SITE_OTHER): Payer: Medicare HMO | Admitting: Podiatry

## 2021-07-08 DIAGNOSIS — M1712 Unilateral primary osteoarthritis, left knee: Secondary | ICD-10-CM

## 2021-07-08 DIAGNOSIS — M7661 Achilles tendinitis, right leg: Secondary | ICD-10-CM
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
## 2021-07-17 ENCOUNTER — Other Ambulatory Visit: Payer: Self-pay

## 2021-07-17 ENCOUNTER — Encounter: Payer: Self-pay | Admitting: Family Medicine

## 2021-07-17 ENCOUNTER — Ambulatory Visit (INDEPENDENT_AMBULATORY_CARE_PROVIDER_SITE_OTHER): Payer: Medicare HMO | Admitting: Family Medicine

## 2021-07-17 DIAGNOSIS — M25562 Pain in left knee: Secondary | ICD-10-CM

## 2021-07-17 MED ORDER — CELECOXIB 200 MG PO CAPS: 200.0000 mg | ORAL_CAPSULE | Freq: Two times a day (BID) | ORAL | 6 refills | Status: DC | PRN

## 2021-07-17 NOTE — Patient Instructions (Signed)
  Over the counter for arthritis:   Glucosamine Sulfate:  1,000 mg twice daily  Turmeric:  500 mg twice daily
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
## 2021-09-30 ENCOUNTER — Other Ambulatory Visit: Payer: Self-pay | Admitting: Family Medicine

## 2021-09-30 DIAGNOSIS — Z1231 Encounter for screening mammogram for malignant neoplasm of breast: Secondary | ICD-10-CM

## 2021-10-17 ENCOUNTER — Encounter: Payer: Self-pay | Admitting: Orthopaedic Surgery

## 2021-10-17 ENCOUNTER — Other Ambulatory Visit: Payer: Self-pay

## 2021-10-17 ENCOUNTER — Ambulatory Visit: Payer: Self-pay

## 2021-10-17 ENCOUNTER — Ambulatory Visit: Payer: Medicare HMO | Admitting: Orthopaedic Surgery

## 2021-10-17 DIAGNOSIS — M25562 Pain in left knee: Secondary | ICD-10-CM | POA: Diagnosis not present

## 2021-10-17 NOTE — Progress Notes (Signed)
Office Visit Note   Patient: [name] [name]           Date of Birth: Oct 20, 1946           MRN: [id number] Visit Date: 10/17/2021              Requested by: Bartholome Bill, [address] [place],  [place] [address] PCP: Bartholome Bill, MD   Assessment & Plan: Visit Diagnoses:  1. Acute pain of left knee     Plan: Given continued symptoms despite conservative management we will obtain MRI to rule out structural abnormalities particularly meniscus tear or loose body that may cause locking sensation.  Follow-up after the MRI.  Follow-Up Instructions: No follow-ups on file.   Orders:  Orders Placed This Encounter  Procedures   XR KNEE 3 VIEW LEFT   No orders of the defined types were placed in this encounter.     Procedures: No procedures performed   Clinical Data: No additional findings.   Subjective: Chief Complaint  Patient presents with   Left Knee - Pain    HPI  Zema is previous patient of Dr. Junius Roads who comes in today for evaluation of chronic left knee pain.  She feels that this is getting worse.  She has constant grinding and locking and catching sensation.  Voltaren gel does not help.  She had a cortisone injection in the left knee a few years ago which did not provide significant relief.  Review of Systems   Objective: Vital Signs: There were no vitals taken for this visit.  Physical Exam  Ortho Exam  Left knee shows crepitus throughout arc of motion.  Collaterals and cruciates are stable.  No joint effusion.  Specialty Comments:  No specialty comments available.  Imaging: No results found.   PMFS History: Patient Active Problem List   Diagnosis Date Noted   Mixed hyperlipidemia 06/16/2021   Coronary artery disease 05/08/2021   Lumbar spondylosis 11/23/2019   History of trichomonal vaginitis 09/07/2018   Post-menopausal bleeding 08/26/2018   Bilateral sensorineural hearing loss 06/07/2018   Primary  osteoarthritis of left knee 02/09/2018   OSA (obstructive sleep apnea) 01/30/2018   Sleep disturbance 12/10/2017   Mild intermittent asthma without complication 29/52/8413   B12 deficiency 09/10/2017   Uterine adenomyoma 07/04/2017   Symptomatic anemia 07/04/2017   Bacteremia, escherichia coli 07/04/2017   Angiodysplasia of duodenum    Iron deficiency anemia due to chronic blood loss    Rectal bleeding    Sepsis ([place]) 06/29/2017   Acute lower UTI 06/29/2017   Diabetes mellitus type 2 in obese ([place]) 06/29/2017   Acute cystitis without hematuria    RHINITIS, ALLERGIC 02/18/2007   ASTHMA, UNSPECIFIED 02/18/2007   BACK PAIN, LOW 02/18/2007   LEG PAIN OR KNEE PAIN 02/18/2007   Former smoker 02/18/2007   Past Medical History:  Diagnosis Date   Anemia    Arthritis    Asthma    COPD (chronic obstructive pulmonary disease) ([place])    Diabetes mellitus without complication (HCC)    Headache    Hypertension    OSA (obstructive sleep apnea) 01/30/2018   Pneumonia    Sleep apnea    UTI (urinary tract infection) 2018    Family History  Problem Relation Age of Onset   COPD Mother    CAD Neg Hx    Diabetes Mellitus II Neg Hx     Past Surgical History:  Procedure Laterality Date  CESAREAN SECTION     COLONOSCOPY N/A 07/04/2017   Procedure: COLONOSCOPY;  Surgeon: Jerene Bears, MD;  Location: [facility];  Service: Gastroenterology;  Laterality: N/A;   ESOPHAGOGASTRODUODENOSCOPY N/A 07/04/2017   Procedure: ESOPHAGOGASTRODUODENOSCOPY (EGD);  Surgeon: Jerene Bears, MD;  Location: [facility];  Service: Gastroenterology;  Laterality: N/A;   FOOT SURGERY     Social History   Occupational History   Not on file  Tobacco Use   Smoking status: Former    Packs/day: 0.25    Years: 6.00    Pack years: 1.50    Types: Cigarettes    Start date: 12/22/1960    Quit date: 12/2020    Years since quitting: 0.8   Smokeless tobacco: Never   Tobacco comments:    smokes 2 cigarettes per  day-11/21/2020  Vaping Use   Vaping Use: Former  Substance and Sexual Activity   Alcohol use: Not Currently    Alcohol/week: 1.0 standard drink    Types: 1 Glasses of wine per week   Drug use: No   Sexual activity: Not on file

## 2021-10-17 NOTE — Addendum Note (Signed)
Addended by: Lendon Collar on: 10/17/2021 09:59 AM   Modules accepted: Orders

## 2021-10-28 ENCOUNTER — Telehealth: Payer: Self-pay | Admitting: Orthopaedic Surgery

## 2021-11-01 ENCOUNTER — Other Ambulatory Visit: Payer: Self-pay

## 2021-11-01 ENCOUNTER — Ambulatory Visit
Admission: RE | Admit: 2021-11-01 | Discharge: 2021-11-01 | Disposition: A | Discharge status: Home / Self Care | Payer: Medicare HMO | Source: Ambulatory Visit | Attending: Family Medicine | Admitting: Family Medicine

## 2021-11-01 DIAGNOSIS — Z1231 Encounter for screening mammogram for malignant neoplasm of breast: Secondary | ICD-10-CM

## 2021-11-02 ENCOUNTER — Ambulatory Visit
Admission: RE | Admit: 2021-11-02 | Discharge: 2021-11-02 | Disposition: A | Discharge status: Home / Self Care | Payer: Medicare HMO | Source: Ambulatory Visit | Attending: Orthopaedic Surgery | Admitting: Orthopaedic Surgery

## 2021-11-02 DIAGNOSIS — M25562 Pain in left knee: Secondary | ICD-10-CM

## 2021-11-08 ENCOUNTER — Other Ambulatory Visit: Payer: Self-pay

## 2021-11-08 ENCOUNTER — Ambulatory Visit (INDEPENDENT_AMBULATORY_CARE_PROVIDER_SITE_OTHER): Payer: Medicare HMO | Admitting: Orthopaedic Surgery

## 2021-11-08 ENCOUNTER — Encounter: Payer: Self-pay | Admitting: Orthopaedic Surgery

## 2021-11-08 DIAGNOSIS — S838X2A Sprain of other specified parts of left knee, initial encounter: Secondary | ICD-10-CM | POA: Diagnosis not present

## 2021-11-08 NOTE — Progress Notes (Signed)
Office Visit Note   Patient: [name] [name]           Date of Birth: [birth year]           MRN: [id number] Visit Date: 11/08/2021              Requested by: Bartholome Bill, [address] [place],  [place] [address] PCP: Bartholome Bill, MD   Assessment & Plan: Visit Diagnoses:  1. Acute lateral meniscal injury of left knee, initial encounter     Plan: Impression is complex tear of the lateral meniscus.  Mild chondromalacia of the knee joint.  Given the mechanical symptoms and lack of relief from conservative management I recommend arthroscopic partial lateral meniscectomy and chondroplasty as indicated.  Risk benefits rehab recovery of the surgery reviewed the patient detail.  She will meet with Jackelyn Poling today to schedule surgery.  Follow-Up Instructions: No follow-ups on file.   Orders:  No orders of the defined types were placed in this encounter.  No orders of the defined types were placed in this encounter.     Procedures: No procedures performed   Clinical Data: No additional findings.   Subjective: Chief Complaint  Patient presents with   Left Knee - Pain    [name] [name] is a 75 year old female here for review of left knee MRI.  Continues to have significant locking and catching symptoms in the knee.   Review of Systems   Objective: Vital Signs: There were no vitals taken for this visit.  Physical Exam  Ortho Exam  Left knee exam shows exquisite tenderness to the lateral joint line.  Positive McMurray.    Specialty Comments:  No specialty comments available.  Imaging: No results found.   PMFS History: Patient Active Problem List   Diagnosis Date Noted   Mixed hyperlipidemia 06/16/2021   Coronary artery disease 05/08/2021   Lumbar spondylosis 11/23/2019   History of trichomonal vaginitis 09/07/2018   Post-menopausal bleeding 08/26/2018   Bilateral sensorineural hearing loss 06/07/2018   Primary osteoarthritis of left knee  02/09/2018   OSA (obstructive sleep apnea) 01/30/2018   Sleep disturbance 12/10/2017   Mild intermittent asthma without complication 27/74/1287   B12 deficiency 09/10/2017   Uterine adenomyoma 07/04/2017   Symptomatic anemia 07/04/2017   Bacteremia, escherichia coli 07/04/2017   Angiodysplasia of duodenum    Iron deficiency anemia due to chronic blood loss    Rectal bleeding    Sepsis ([place]) 06/29/2017   Acute lower UTI 06/29/2017   Diabetes mellitus type 2 in obese ([place]) 06/29/2017   Acute cystitis without hematuria    RHINITIS, ALLERGIC 02/18/2007   ASTHMA, UNSPECIFIED 02/18/2007   BACK PAIN, LOW 02/18/2007   LEG PAIN OR KNEE PAIN 02/18/2007   Former smoker 02/18/2007   Past Medical History:  Diagnosis Date   Anemia    Arthritis    Asthma    COPD (chronic obstructive pulmonary disease) ([place])    Diabetes mellitus without complication (HCC)    Headache    Hypertension    OSA (obstructive sleep apnea) 01/30/2018   Pneumonia    Sleep apnea    UTI (urinary tract infection) 2018    Family History  Problem Relation Age of Onset   COPD Mother    CAD Neg Hx    Diabetes Mellitus II Neg Hx    Breast cancer Neg Hx     Past Surgical History:  Procedure Laterality Date   CESAREAN SECTION  COLONOSCOPY N/A 07/04/2017   Procedure: COLONOSCOPY;  Surgeon: Jerene Bears, MD;  Location: [facility];  Service: Gastroenterology;  Laterality: N/A;   ESOPHAGOGASTRODUODENOSCOPY N/A 07/04/2017   Procedure: ESOPHAGOGASTRODUODENOSCOPY (EGD);  Surgeon: Jerene Bears, MD;  Location: [facility];  Service: Gastroenterology;  Laterality: N/A;   FOOT SURGERY     Social History   Occupational History   Not on file  Tobacco Use   Smoking status: Former    Packs/day: 0.25    Years: 6.00    Pack years: 1.50    Types: Cigarettes    Start date: 12/22/1960    Quit date: 12/2020    Years since quitting: 0.8   Smokeless tobacco: Never   Tobacco comments:    smokes 2 cigarettes per  day-11/21/2020  Vaping Use   Vaping Use: Former  Substance and Sexual Activity   Alcohol use: Not Currently    Alcohol/week: 1.0 standard drink    Types: 1 Glasses of wine per week   Drug use: No   Sexual activity: Not on file

## 2022-01-06 ENCOUNTER — Ambulatory Visit (INDEPENDENT_AMBULATORY_CARE_PROVIDER_SITE_OTHER): Payer: Medicare HMO

## 2022-01-06 ENCOUNTER — Ambulatory Visit: Payer: Medicare HMO | Admitting: Podiatry

## 2022-01-06 ENCOUNTER — Other Ambulatory Visit: Payer: Self-pay

## 2022-01-06 DIAGNOSIS — M21612 Bunion of left foot: Secondary | ICD-10-CM

## 2022-01-06 DIAGNOSIS — E1169 Type 2 diabetes mellitus with other specified complication: Secondary | ICD-10-CM

## 2022-01-06 DIAGNOSIS — E669 Obesity, unspecified: Secondary | ICD-10-CM | POA: Diagnosis not present

## 2022-01-06 NOTE — Progress Notes (Signed)
°  Subjective:  Patient ID: [name] [name], female    DOB: June 07, 1946,  MRN: [id number]  Chief Complaint  Patient presents with   Bunions    Bilateral painful bunions   Tendonitis    Follow up achilles tendonitis right    76 y.o. female presents with the above complaint. History confirmed with patient.  Her Achilles tendon is doing much better and only hurts in significant times when it rains otherwise doing well.  She has had worsening of bunion pain her left worse than the right.  Since her blood sugar has not been doing well and her EllaOne C is elevated at 8.3%  Objective:  Physical Exam: warm, good capillary refill, no trophic changes or ulcerative lesions, normal DP and PT pulses, and normal sensory exam.  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  Radiographs: Multiple views x-ray of the left foot: Significant osteoarthritis of the first MTPJ with periarticular spurring and narrowing of the joint space Assessment:   1. Bunion of great toe of left foot   2. Diabetes mellitus type 2 in obese [facility])      Plan:  Patient was evaluated and treated and all questions answered.   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  This is quite painful and limiting for her.  I do think surgically there will be benefit to correcting this likely with a first MTPJ arthrodesis.  Unfortunate her A1c is still elevated 8.3%.  I discussed with her this would need to be lower prior to surgery.  I have ordered her new A1c to be checked in 3 months I will see her back around this timeframe in April to plan for hopeful surgery in late spring or early summer.  Return in about 3 months (around 04/06/2022) for f/u lab work and bunion left foot .

## 2022-01-14 ENCOUNTER — Other Ambulatory Visit: Payer: Self-pay

## 2022-01-14 ENCOUNTER — Ambulatory Visit
Admission: RE | Admit: 2022-01-14 | Discharge: 2022-01-14 | Disposition: A | Discharge status: Home / Self Care | Payer: Medicare HMO | Source: Ambulatory Visit | Attending: Family Medicine | Admitting: Family Medicine

## 2022-01-14 ENCOUNTER — Other Ambulatory Visit: Payer: Self-pay | Admitting: Family Medicine

## 2022-01-14 DIAGNOSIS — R0609 Other forms of dyspnea: Secondary | ICD-10-CM

## 2022-01-24 ENCOUNTER — Other Ambulatory Visit: Payer: Self-pay | Admitting: Family Medicine

## 2022-01-24 DIAGNOSIS — R0609 Other forms of dyspnea: Secondary | ICD-10-CM

## 2022-01-31 ENCOUNTER — Ambulatory Visit: Payer: Medicare HMO | Admitting: Pulmonary Disease

## 2022-02-23 IMAGING — CR DG CHEST 2V
2 series · 2 of 2 positions shown · non-contrast
Comparison: March 08, 2021

CLINICAL DATA: Shortness of breath on exertion x1 month.

EXAM:
CHEST - 2 VIEW

[w chest pa]
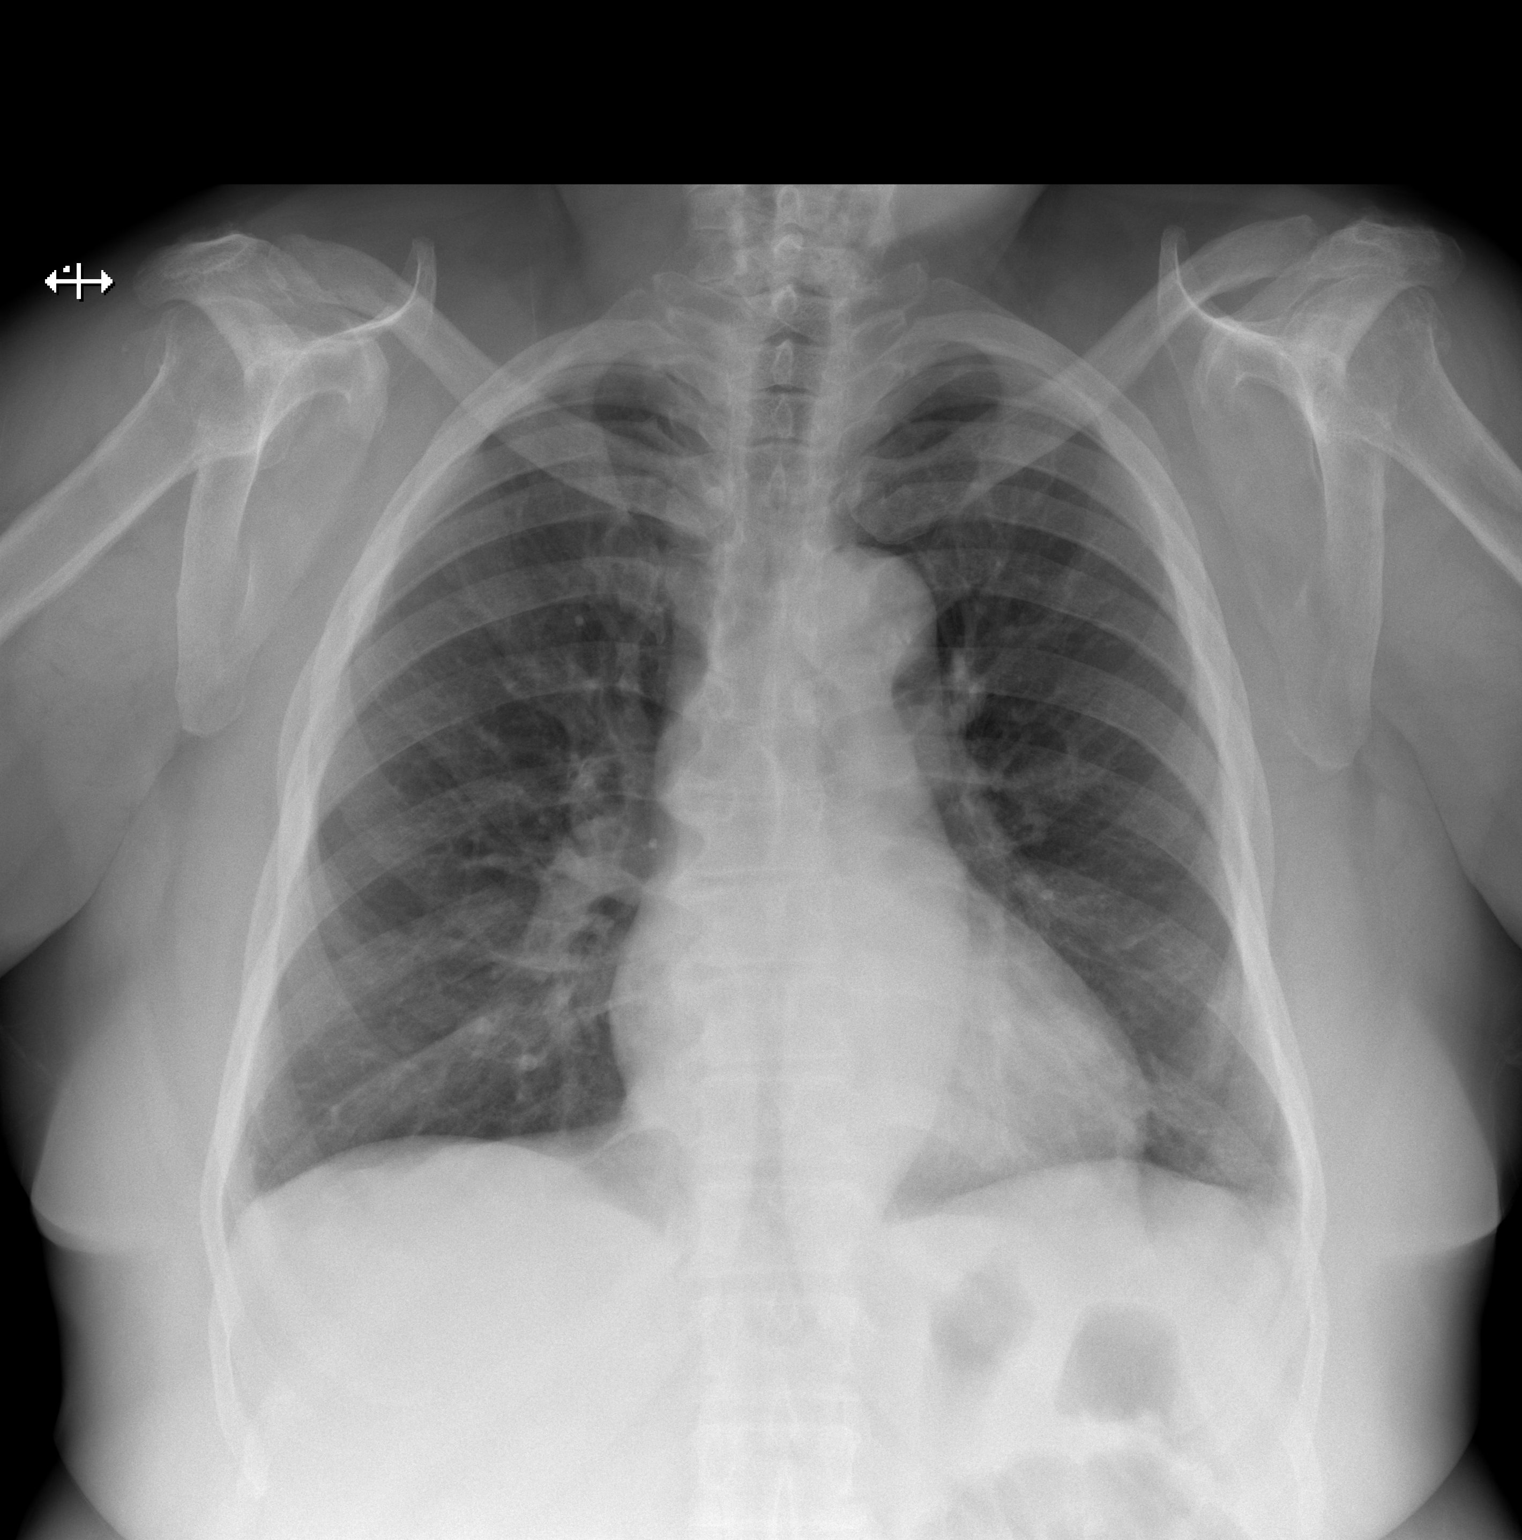

[w chest lat]
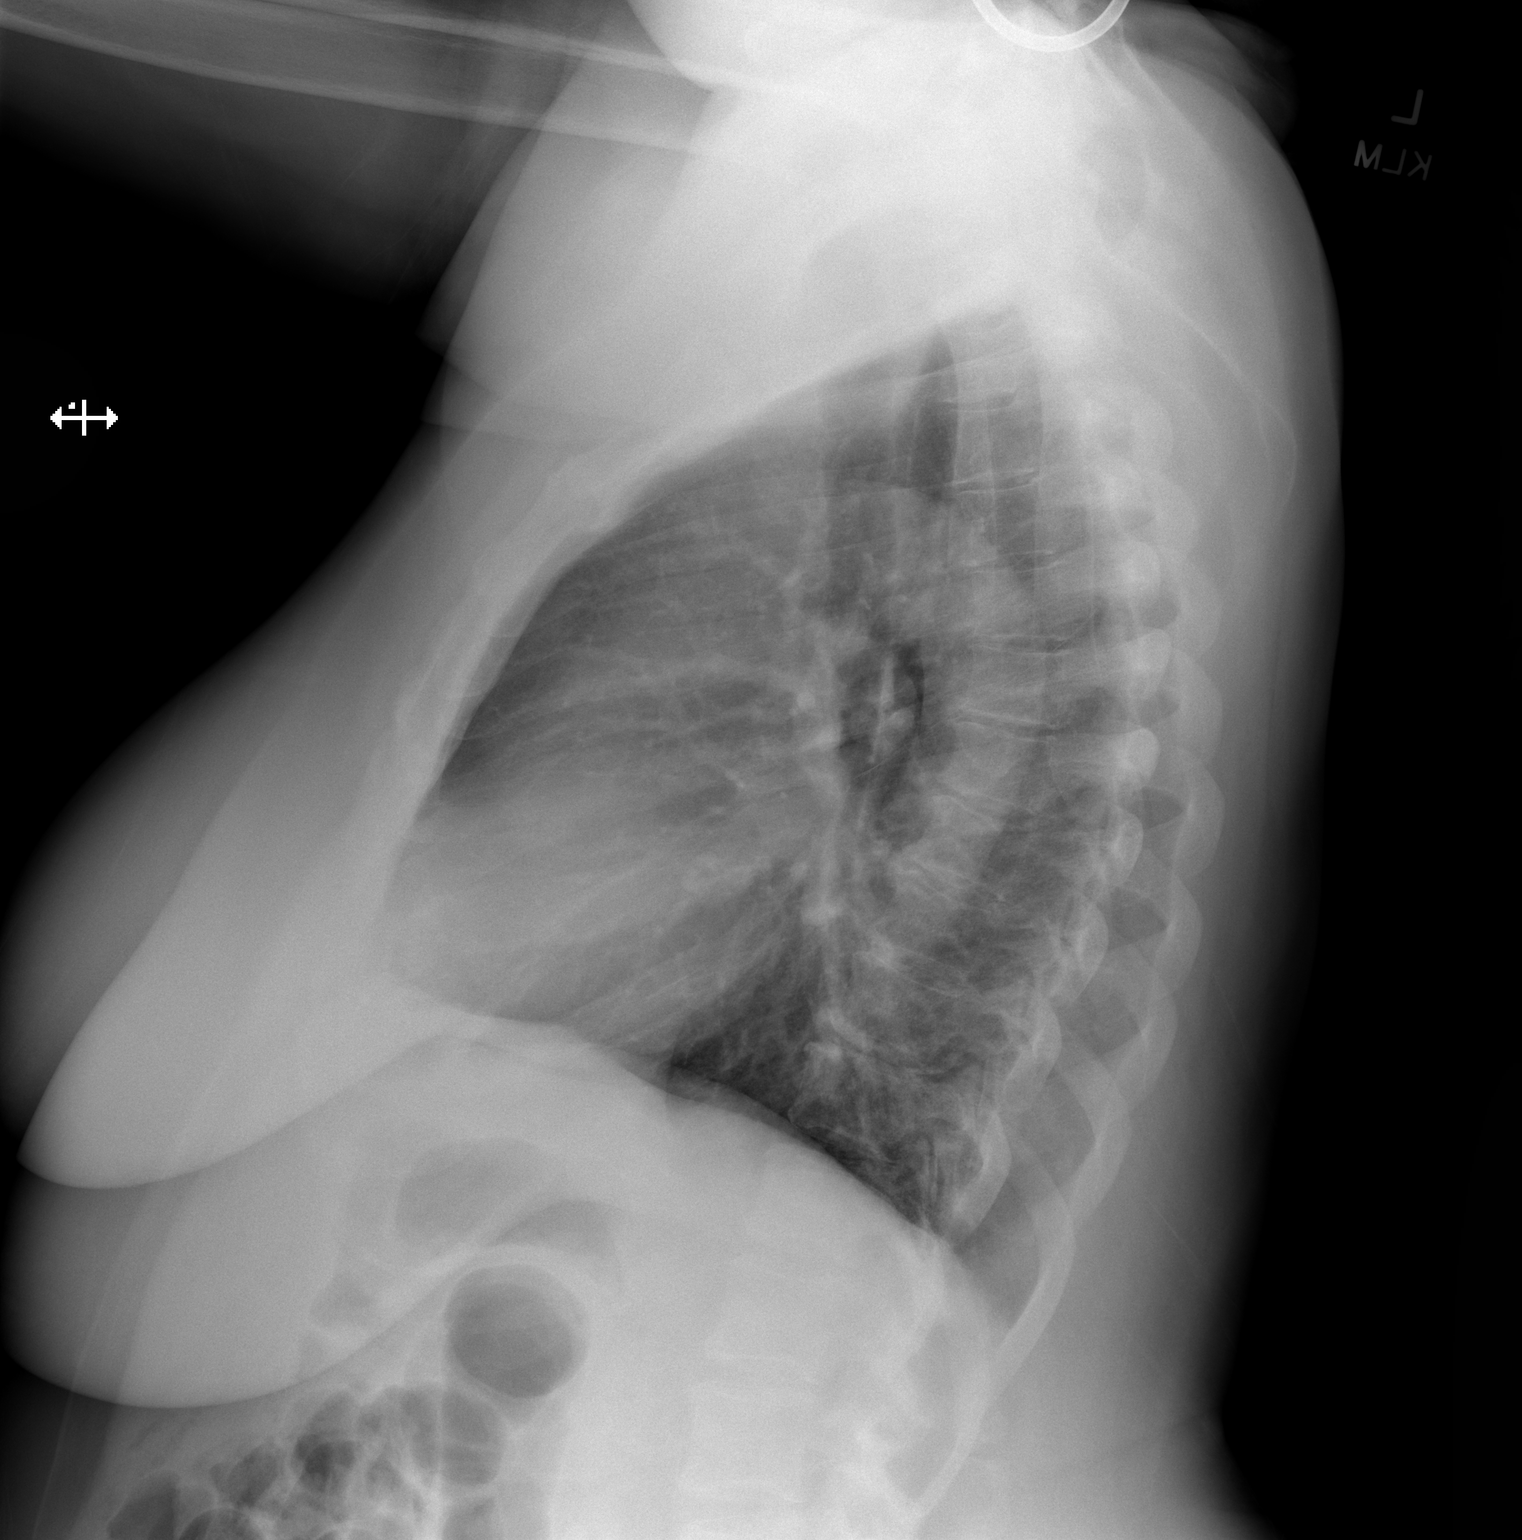

[2 of 2 positions shown; findings below may reference images not displayed]

FINDINGS: The heart size and mediastinal contours are within normal limits.
Both lungs are clear. The visualized skeletal structures are
unremarkable.
IMPRESSION: No active cardiopulmonary disease.

## 2022-03-10 ENCOUNTER — Other Ambulatory Visit: Payer: Self-pay

## 2022-03-10 ENCOUNTER — Encounter: Payer: Self-pay | Admitting: Pulmonary Disease

## 2022-03-10 ENCOUNTER — Ambulatory Visit: Payer: Medicare HMO | Admitting: Pulmonary Disease

## 2022-03-10 VITALS — BP 130/68 | HR 82 | Ht 65.0 in | Wt 206.6 lb

## 2022-03-10 DIAGNOSIS — G4733 Obstructive sleep apnea (adult) (pediatric): Secondary | ICD-10-CM

## 2022-03-10 NOTE — Patient Instructions (Signed)
Will have [employer] refit your CPAP mask ? ?Follow up in 1 year ?

## 2022-03-10 NOTE — Progress Notes (Signed)
? ?[facility] ? ?Chief Complaint  ?Patient presents with  ? Follow-up  ?  Cpap compliance   ? ? ?Past Surgical History:  ?She  has a past surgical history that includes Cesarean section; Esophagogastroduodenoscopy (N/A, 07/04/2017); Colonoscopy (N/A, 07/04/2017); and Foot surgery. ? ?Past Medical History:  ?Spinal stenosis, DM, HA, HTN, PNA, Asthma ? ?Constitutional:  ?BP 130/68 (BP Location: Left Arm, Cuff Size: Normal)   Pulse 82   Ht '5\' 5"'$  (1.651 m)   Wt 206 lb 9.6 oz (93.7 kg)   SpO2 97%   BMI 34.38 kg/m?  ? ?Brief Summary:  ?[name] [name] is a 76 y.o. female former smoker with obstructive sleep apnea. ?  ? ? ? ?Subjective:  ? ?Uses CPAP nightly.  No issue with pressure setting.  Mask is still to small.  She gets irritation on her lips and face because mask is too small. ? ?Physical Exam:  ? ?Appearance - well kempt  ? ?ENMT - no sinus tenderness, no oral exudate, no LAN, Mallampati 3 airway, no stridor ? ?Respiratory - equal breath sounds bilaterally, no wheezing or rales ? ?CV - s1s2 regular rate and rhythm, no murmurs ? ?Ext - no clubbing, no edema ? ?Skin - no rashes ? ?Psych - normal mood and affect ?  ?Sleep Tests:  ?HST 01/12/18 >> AHI 22.1, SaO2 low 71%. ?Auto CPAP 02/05/22 to 03/06/22 >> used 30 of 30 nights with average 7 hrs 40 min.  Average AHI 5.5 with median CPAP 16 cm H2O ? ?Cardiac Tests:  ?Echo 06/12/21 >> EF 55 to 60%, mild AR, mild TR ? ?Social History:  ?She  reports that she quit smoking about 14 months ago. Her smoking use included cigarettes. She started smoking about 61 years ago. She has a 1.50 pack-year smoking history. She has never used smokeless tobacco. She reports that she does not currently use alcohol after a past usage of about 1.0 standard drink per week. She reports that she does not use drugs. ? ?Family History:  ?Her family history includes COPD in her mother. ?  ? ? ?Assessment/Plan:  ? ?Obstructive sleep apnea. ?- she is compliant  with CPAP and reports benefit from therapy ?- she uses [facility] for her DME ?- continue auto CPAP 9 to 18 cm H2O ?- will have [facility] refit her CPAP mask ?  ? ?Time Spent Involved in Patient Care on Day of Examination:  ?18 minutes ? ?Follow up:  ? ?Patient Instructions  ?Will have [facility] refit your CPAP mask ? ?Follow up in 1 year ? ?Medication List:  ? ?Allergies as of 03/10/2022   ?No Known Allergies ?  ? ?  ?Medication List  ?  ? ?  ? Accurate as of March 10, 2022  4:48 PM. If you have any questions, ask your nurse or doctor.  ?  ?  ? ?  ? ?albuterol 108 (90 Base) MCG/ACT inhaler ?Commonly known as: VENTOLIN HFA ?Inhale 2 puffs into the lungs every 6 (six) hours as needed for wheezing or shortness of breath. ?  ?celecoxib 200 MG capsule ?Commonly known as: CeleBREX ?Take 1 capsule (200 mg total) by mouth 2 (two) times daily as needed. ?  ?diclofenac sodium 1 % Gel ?Commonly known as: VOLTAREN ?Apply 2 g topically 4 (four) times daily. ?  ?escitalopram 20 MG tablet ?Commonly known as: LEXAPRO ?Take 1 tablet by mouth daily. ?  ?ferrous sulfate 325 (65 FE) MG tablet ?Take 1 tablet (  325 mg total) by mouth 3 (three) times daily with meals. ?What changed: when to take this ?  ?Januvia 100 MG tablet ?Generic drug: sitaGLIPtin ?Take 100 mg by mouth daily. ?  ?metFORMIN 1000 MG tablet ?Commonly known as: GLUCOPHAGE ?Take 1 tablet (1,000 mg total) by mouth 2 (two) times daily with a meal. ?  ?omeprazole 40 MG capsule ?Commonly known as: PRILOSEC ?Take 40 mg by mouth daily. ?  ?rosuvastatin 10 MG tablet ?Commonly known as: CRESTOR ?Take 1 tablet (10 mg total) by mouth daily. ?  ?vitamin B-12 1000 MCG tablet ?Commonly known as: CYANOCOBALAMIN ?Take 1,000 mcg by mouth daily. ?  ?Vitamin D 125 MCG (5000 UT) Caps ?Take by mouth. ?  ? ?  ? ? ?Signature:  ?Chesley Mires, MD ?[facility] ?Pager - [phone] - [phone] ?03/10/2022, 4:48 PM ?  ? ? ? ? ? ? ? ? ?

## 2022-03-17 ENCOUNTER — Ambulatory Visit: Payer: Medicare HMO | Admitting: Podiatry

## 2022-04-08 LAB — HEMOGLOBIN A1C
Est. average glucose Bld gHb Est-mCnc: 177 mg/dL
Hgb A1c MFr Bld: 7.8 % — ABNORMAL HIGH (ref 4.8–5.6)

## 2022-04-14 ENCOUNTER — Ambulatory Visit: Payer: Medicare HMO | Admitting: Podiatry

## 2022-05-05 NOTE — Telephone Encounter (Signed)
error 

## 2022-11-07 ENCOUNTER — Other Ambulatory Visit: Payer: Self-pay | Admitting: Family Medicine

## 2022-11-07 DIAGNOSIS — Z1231 Encounter for screening mammogram for malignant neoplasm of breast: Secondary | ICD-10-CM

## 2023-01-01 ENCOUNTER — Ambulatory Visit
Admission: RE | Admit: 2023-01-01 | Discharge: 2023-01-01 | Disposition: A | Discharge status: Home / Self Care | Payer: Medicare HMO | Source: Ambulatory Visit | Attending: Family Medicine | Admitting: Family Medicine

## 2023-01-01 DIAGNOSIS — Z1231 Encounter for screening mammogram for malignant neoplasm of breast: Secondary | ICD-10-CM

## 2023-04-16 ENCOUNTER — Encounter (HOSPITAL_BASED_OUTPATIENT_CLINIC_OR_DEPARTMENT_OTHER): Payer: Self-pay | Admitting: Pulmonary Disease

## 2023-04-16 ENCOUNTER — Ambulatory Visit (HOSPITAL_BASED_OUTPATIENT_CLINIC_OR_DEPARTMENT_OTHER): Payer: Medicare HMO | Admitting: Pulmonary Disease

## 2023-04-16 VITALS — BP 116/60 | HR 84 | Wt 192.0 lb

## 2023-04-16 DIAGNOSIS — G4733 Obstructive sleep apnea (adult) (pediatric): Secondary | ICD-10-CM

## 2023-04-16 NOTE — Progress Notes (Signed)
[facility]  Chief Complaint  Patient presents with   Follow-up    Not wearing cpap states hurts her neck    Past Surgical History:  She  has a past surgical history that includes Cesarean section; Esophagogastroduodenoscopy (N/A, 07/04/2017); Colonoscopy (N/A, 07/04/2017); and Foot surgery.  Past Medical History:  Spinal stenosis, DM, HA, HTN, PNA, Asthma  Constitutional:  BP 116/60 (BP Location: Right Arm, Cuff Size: Normal)   Pulse 84   Wt 192 lb (87.1 kg)   SpO2 92%   BMI 31.95 kg/m   Brief Summary:  [name] [name] is a 77 y.o. female former smoker with obstructive sleep apnea.      Subjective:   She uses CPAP nightly.  Her mask strap is causing pressure on her neck and this makes it difficult to use for a whole night.  Her pillow is comfortable.  She feels the CPAP pressure is comfortable and helps with her sleep otherwise.  She doesn't have any issues with her neck otherwise.  Physical Exam:   Appearance - well kempt   ENMT - no sinus tenderness, no oral exudate, no LAN, Mallampati 3 airway, no stridor  Respiratory - equal breath sounds bilaterally, no wheezing or rales  CV - s1s2 regular rate and rhythm, no murmurs  Ext - no clubbing, no edema  Skin - no rashes  Psych - normal mood and affect   Sleep Tests:  HST 01/12/18 >> AHI 22.1, SaO2 low 71%. Auto CPAP 03/17/23 to 04/15/23 >> used on 30 of 30 nights with average 7 hrs 13 min.  Average AHI 2.6 with median CPAP 15 and 95 th percentile CPAP 17 cm H2O  Cardiac Tests:  Echo 06/12/21 >> EF 55 to 60%, mild AR, mild TR  Social History:  She  reports that she quit smoking about 2 years ago. Her smoking use included cigarettes. She started smoking about 62 years ago. She has a 1.50 pack-year smoking history. She has never used smokeless tobacco. She reports that she does not currently use alcohol after a past usage of about 1.0 standard drink of alcohol per week. She  reports that she does not use drugs.  Family History:  Her family history includes COPD in her mother.     Assessment/Plan:   Obstructive sleep apnea. - she is compliant with CPAP and reports benefit from therapy - she uses [facility] for her DME - continue auto CPAP 9 to 18 cm H2O - main issues is mask fit causing neck pain - will have her mask refit; she can also look up CPAP mask options on-line    Time Spent Involved in Patient Care on Day of Examination:  26 minutes  Follow up:   Patient Instructions  Will have [facility] refit your CPAP mask.  You can look up CPAP mask options at [email or web address] or similar web site.  Follow up in 1 year.  Medication List:   Allergies as of 04/16/2023   No Known Allergies      Medication List        Accurate as of April 16, 2023  9:49 AM. If you have any questions, ask your nurse or doctor.          STOP taking these medications    celecoxib 200 MG capsule Commonly known as: CeleBREX Stopped by: Coralyn Helling, MD   escitalopram 20 MG tablet Commonly known as: LEXAPRO Stopped by: Coralyn Helling, MD   Januvia 100 MG tablet  Generic drug: sitaGLIPtin Stopped by: Coralyn Helling, MD   omeprazole 40 MG capsule Commonly known as: PRILOSEC Stopped by: Coralyn Helling, MD       TAKE these medications    albuterol 108 (90 Base) MCG/ACT inhaler Commonly known as: VENTOLIN HFA Inhale 2 puffs into the lungs every 6 (six) hours as needed for wheezing or shortness of breath.   cyanocobalamin 1000 MCG tablet Commonly known as: VITAMIN B12 Take 1,000 mcg by mouth daily.   diclofenac sodium 1 % Gel Commonly known as: VOLTAREN Apply 2 g topically 4 (four) times daily.   ferrous sulfate 325 (65 FE) MG tablet Take 1 tablet (325 mg total) by mouth 3 (three) times daily with meals. What changed: when to take this   gabapentin 300 MG capsule Commonly known as: NEURONTIN Take by mouth.   metFORMIN 1000 MG tablet Commonly known as:  GLUCOPHAGE Take 1 tablet (1,000 mg total) by mouth 2 (two) times daily with a meal.   Ozempic (0.25 or 0.5 MG/DOSE) 2 MG/3ML Sopn Generic drug: Semaglutide(0.25 or 0.5MG /DOS) SMARTSIG:0.2 Milliliter(s) SUB-Q Once a Week   rosuvastatin 10 MG tablet Commonly known as: CRESTOR Take 1 tablet (10 mg total) by mouth daily.   Vitamin D 125 MCG (5000 UT) Caps Take by mouth.        Signature:  Coralyn Helling, MD [facility] Pager - [phone] 04/16/2023, 9:49 AM

## 2023-04-16 NOTE — Patient Instructions (Signed)
Will have [facility] refit your CPAP mask.  You can look up CPAP mask options at [email or web address] or similar web site.  Follow up in 1 year.

## 2023-07-28 ENCOUNTER — Ambulatory Visit (INDEPENDENT_AMBULATORY_CARE_PROVIDER_SITE_OTHER): Payer: Medicare HMO

## 2023-07-28 ENCOUNTER — Ambulatory Visit: Payer: Medicare HMO | Admitting: Podiatry

## 2023-07-28 DIAGNOSIS — M21612 Bunion of left foot: Secondary | ICD-10-CM

## 2023-07-28 DIAGNOSIS — M7662 Achilles tendinitis, left leg: Secondary | ICD-10-CM | POA: Diagnosis not present

## 2023-07-28 NOTE — Patient Instructions (Signed)
Call to schedule physical therapy: [facility] at [facility]  [phone]    Achilles Tendinitis  with Rehab Achilles tendinitis is a disorder of the Achilles tendon. The Achilles tendon connects the large calf muscles (Gastrocnemius and Soleus) to the heel bone (calcaneus). This tendon is sometimes called the heel cord. It is important for pushing-off and standing on your toes and is important for walking, running, or jumping. Tendinitis is often caused by overuse and repetitive microtrauma. SYMPTOMS Pain, tenderness, swelling, warmth, and redness may occur over the Achilles tendon even at rest. Pain with pushing off, or flexing or extending the ankle. Pain that is worsened after or during activity. CAUSES  Overuse sometimes seen with rapid increase in exercise programs or in sports requiring running and jumping. Poor physical conditioning (strength and flexibility or endurance). Running sports, especially training running down hills. Inadequate warm-up before practice or play or failure to stretch before participation. Injury to the tendon. PREVENTION  Warm up and stretch before practice or competition. Allow time for adequate rest and recovery between practices and competition. Keep up conditioning. Keep up ankle and leg flexibility. Improve or keep muscle strength and endurance. Improve cardiovascular fitness. Use proper technique. Use proper equipment (shoes, skates). To help prevent recurrence, taping, protective strapping, or an adhesive bandage may be recommended for several weeks after healing is complete. PROGNOSIS  Recovery may take weeks to several months to heal. Longer recovery is expected if symptoms have been prolonged. Recovery is usually quicker if the inflammation is due to a direct blow as compared with overuse or sudden strain. RELATED COMPLICATIONS  Healing time will be prolonged if the  condition is not correctly treated. The injury must be given plenty of time to heal. Symptoms can reoccur if activity is resumed too soon. Untreated, tendinitis may increase the risk of tendon rupture requiring additional time for recovery and possibly surgery. TREATMENT  The first treatment consists of rest anti-inflammatory medication, and ice to relieve the pain. Stretching and strengthening exercises after resolution of pain will likely help reduce the risk of recurrence. Referral to a physical therapist or athletic trainer for further evaluation and treatment may be helpful. A walking boot or cast may be recommended to rest the Achilles tendon. This can help break the cycle of inflammation and microtrauma. Arch supports (orthotics) may be prescribed or recommended by your caregiver as an adjunct to therapy and rest. Surgery to remove the inflamed tendon lining or degenerated tendon tissue is rarely necessary and has shown less than predictable results. MEDICATION  Nonsteroidal anti-inflammatory medications, such as aspirin and ibuprofen, may be used for pain and inflammation relief. Do not take within 7 days before surgery. Take these as directed by your caregiver. Contact your caregiver immediately if any bleeding, stomach upset, or signs of allergic reaction occur. Other minor pain relievers, such as acetaminophen, may also be used. Pain relievers may be prescribed as necessary by your caregiver. Do not take prescription pain medication for longer than 4 to 7 days. Use only as directed and only as much as you need. Cortisone injections are rarely indicated. Cortisone injections may weaken tendons and predispose to rupture. It is better to give the condition more time to heal than to use them. HEAT AND COLD Cold is used to relieve pain and reduce inflammation for acute and chronic Achilles tendinitis. Cold should be applied for 10 to 15 minutes every 2 to 3 hours for inflammation and  pain and  immediately after any activity that aggravates your symptoms. Use ice packs or an ice massage. Heat may be used before performing stretching and strengthening activities prescribed by your caregiver. Use a heat pack or a warm soak. SEEK MEDICAL CARE IF: Symptoms get worse or do not improve in 2 weeks despite treatment. New, unexplained symptoms develop. Drugs used in treatment may produce side effects.  EXERCISES:  RANGE OF MOTION (ROM) AND STRETCHING EXERCISES - Achilles Tendinitis  These exercises may help you when beginning to rehabilitate your injury. Your symptoms may resolve with or without further involvement from your physician, physical therapist or athletic trainer. While completing these exercises, remember:  Restoring tissue flexibility helps normal motion to return to the joints. This allows healthier, less painful movement and activity. An effective stretch should be held for at least 30 seconds. A stretch should never be painful. You should only feel a gentle lengthening or release in the stretched tissue.  STRETCH  Gastroc, Standing  Place hands on wall. Extend right / left leg, keeping the front knee somewhat bent. Slightly point your toes inward on your back foot. Keeping your right / left heel on the floor and your knee straight, shift your weight toward the wall, not allowing your back to arch. You should feel a gentle stretch in the right / left calf. Hold this position for 10 seconds. Repeat 3 times. Complete this stretch 2 times per day.  STRETCH  Soleus, Standing  Place hands on wall. Extend right / left leg, keeping the other knee somewhat bent. Slightly point your toes inward on your back foot. Keep your right / left heel on the floor, bend your back knee, and slightly shift your weight over the back leg so that you feel a gentle stretch deep in your back calf. Hold this position for 10 seconds. Repeat 3 times. Complete this stretch 2 times per day.  STRETCH   Gastrocsoleus, Standing  Note: This exercise can place a lot of stress on your foot and ankle. Please complete this exercise only if specifically instructed by your caregiver.  Place the ball of your right / left foot on a step, keeping your other foot firmly on the same step. Hold on to the wall or a rail for balance. Slowly lift your other foot, allowing your body weight to press your heel down over the edge of the step. You should feel a stretch in your right / left calf. Hold this position for 10 seconds. Repeat this exercise with a slight bend in your knee. Repeat 3 times. Complete this stretch 2 times per day.   STRENGTHENING EXERCISES - Achilles Tendinitis These exercises may help you when beginning to rehabilitate your injury. They may resolve your symptoms with or without further involvement from your physician, physical therapist or athletic trainer. While completing these exercises, remember:  Muscles can gain both the endurance and the strength needed for everyday activities through controlled exercises. Complete these exercises as instructed by your physician, physical therapist or athletic trainer. Progress the resistance and repetitions only as guided. You may experience muscle soreness or fatigue, but the pain or discomfort you are trying to eliminate should never worsen during these exercises. If this pain does worsen, stop and make certain you are following the directions exactly. If the pain is still present after adjustments, discontinue the exercise until you can discuss the trouble with your clinician.  STRENGTH - Plantar-flexors  Sit with your right / left leg extended. Holding  onto both ends of a rubber exercise band/tubing, loop it around the ball of your foot. Keep a slight tension in the band. Slowly push your toes away from you, pointing them downward. Hold this position for 10 seconds. Return slowly, controlling the tension in the band/tubing. Repeat 3 times. Complete  this exercise 2 times per day.   STRENGTH - Plantar-flexors  Stand with your feet shoulder width apart. Steady yourself with a wall or table using as little support as needed. Keeping your weight evenly spread over the width of your feet, rise up on your toes.* Hold this position for 10 seconds. Repeat 3 times. Complete this exercise 2 times per day.  *If this is too easy, shift your weight toward your right / left leg until you feel challenged. Ultimately, you may be asked to do this exercise with your right / left foot only.  STRENGTH  Plantar-flexors, Eccentric  Note: This exercise can place a lot of stress on your foot and ankle. Please complete this exercise only if specifically instructed by your caregiver.  Place the balls of your feet on a step. With your hands, use only enough support from a wall or rail to keep your balance. Keep your knees straight and rise up on your toes. Slowly shift your weight entirely to your right / left toes and pick up your opposite foot. Gently and with controlled movement, lower your weight through your right / left foot so that your heel drops below the level of the step. You will feel a slight stretch in the back of your calf at the end position. Use the healthy leg to help rise up onto the balls of both feet, then lower weight only on the right / left leg again. Build up to 15 repetitions. Then progress to 3 consecutive sets of 15 repetitions.* After completing the above exercise, complete the same exercise with a slight knee bend (about 30 degrees). Again, build up to 15 repetitions. Then progress to 3 consecutive sets of 15 repetitions.* Perform this exercise 2 times per day.  *When you easily complete 3 sets of 15, your physician, physical therapist or athletic trainer may advise you to add resistance by wearing a backpack filled with additional weight.  STRENGTH - Plantar Flexors, Seated  Sit on a chair that allows your feet to rest flat on the  ground. If necessary, sit at the edge of the chair. Keeping your toes firmly on the ground, lift your right / left heel as far as you can without increasing any discomfort in your ankle. Repeat 3 times. Complete this exercise 2 times a day.

## 2023-07-29 NOTE — Progress Notes (Signed)
  Subjective:  Patient ID: [name] [name], female    DOB: Aug 20, 1946,  MRN: [id number]  Chief Complaint  Patient presents with   Tendonitis    Left ankle pain that comes and go.    77 y.o. female presents with the above complaint. History confirmed with patient.  Her right side is doing well, she is developing pain in the left Achilles that is similar to what she previously had.  She is using anti-inflammatory gel on the heel which is helping some.  Objective:  Physical Exam: warm, good capillary refill, no trophic changes or ulcerative lesions, normal DP and PT pulses, and normal sensory exam. Left Foot: tenderness at Achilles tendon insertion and gastrocnemius equinus is noted with a positive silverskiold test   Radiographs: Multiple views x-ray of the left foot: no fracture, dislocation, swelling or degenerative changes noted and calcification of distal Achilles insertion Assessment:   1. Bunion of great toe of left foot   2. Achilles tendinitis of left lower extremity      Plan:  Patient was evaluated and treated and all questions answered.  Discussed the etiology and treatment options for Achilles tendinitis including stretching, formal physical therapy with an eccentric exercises therapy plan, supportive shoegears such as a running shoe or sneaker, heel lifts, topical and oral medications.  We also discussed that I do not routinely perform injections in this area because of the risk of an increased damage or rupture of the tendon.  We also discussed the role of surgical treatment of this for patients who do not improve after exhausting non-surgical treatment options.  -XR reviewed with patient -Educated on stretching and icing of the affected limb. -Referral placed to physical therapy. -If not improving or worsening we will proceed with Topaz and PRP which worked well on her other side  Return in about 8 weeks (around 09/22/2023) for re-check Achilles tendon.

## 2023-08-19 NOTE — Therapy (Deleted)
OUTPATIENT PHYSICAL THERAPY LOWER EXTREMITY EVALUATION   Patient Name: [name] [name] MRN: [id number] DOB:04/21/1946, 77 y.o., female Today's Date: 08/19/2023  END OF SESSION:   Past Medical History:  Diagnosis Date   Anemia    Arthritis    Asthma    COPD (chronic obstructive pulmonary disease) (HCC)    Diabetes mellitus without complication (HCC)    Headache    Hypertension    OSA (obstructive sleep apnea) 01/30/2018   Pneumonia    Sleep apnea    UTI (urinary tract infection) 2018   Past Surgical History:  Procedure Laterality Date   CESAREAN SECTION     COLONOSCOPY N/A 07/04/2017   Procedure: COLONOSCOPY;  Surgeon: Beverley Fiedler, MD;  Location: [facility];  Service: Gastroenterology;  Laterality: N/A;   ESOPHAGOGASTRODUODENOSCOPY N/A 07/04/2017   Procedure: ESOPHAGOGASTRODUODENOSCOPY (EGD);  Surgeon: Beverley Fiedler, MD;  Location: [facility];  Service: Gastroenterology;  Laterality: N/A;   FOOT SURGERY     Patient Active Problem List   Diagnosis Date Noted   Mixed hyperlipidemia 06/16/2021   Coronary artery disease 05/08/2021   Lumbar spondylosis 11/23/2019   History of trichomonal vaginitis 09/07/2018   Post-menopausal bleeding 08/26/2018   Bilateral sensorineural hearing loss 06/07/2018   Primary osteoarthritis of left knee 02/09/2018   OSA (obstructive sleep apnea) 01/30/2018   Sleep disturbance 12/10/2017   Mild intermittent asthma without complication 09/11/2017   B12 deficiency 09/10/2017   Uterine adenomyoma 07/04/2017   Symptomatic anemia 07/04/2017   Bacteremia, escherichia coli 07/04/2017   Angiodysplasia of duodenum    Iron deficiency anemia due to chronic blood loss    Rectal bleeding    Sepsis (HCC) 06/29/2017   Acute lower UTI 06/29/2017   Diabetes mellitus type 2 in obese 06/29/2017   Acute cystitis without hematuria    RHINITIS, ALLERGIC 02/18/2007   ASTHMA, UNSPECIFIED 02/18/2007   BACK PAIN, LOW 02/18/2007   LEG PAIN OR KNEE PAIN 02/18/2007    Former smoker 02/18/2007    PCP: ***  REFERRING PROVIDER: ***  REFERRING DIAG: ***  THERAPY DIAG:  No diagnosis found.  Rationale for Evaluation and Treatment: {HABREHAB:27488}  ONSET DATE: ***  SUBJECTIVE:   SUBJECTIVE STATEMENT: ***  PERTINENT HISTORY: *** PAIN:  Are you having pain? {OPRCPAIN:27236}  PRECAUTIONS: {Therapy precautions:24002}  RED FLAGS: {PT Red Flags:29287}   WEIGHT BEARING RESTRICTIONS: {Yes ***/No:24003}  FALLS:  Has patient fallen in last 6 months? {fallsyesno:27318}  LIVING ENVIRONMENT: Lives with: {OPRC lives with:25569::"lives with their family"} Lives in: {Lives in:25570} Stairs: {opstairs:27293} Has following equipment at home: {Assistive devices:23999}  OCCUPATION: ***  PLOF: {PLOF:24004}  PATIENT GOALS: ***  NEXT MD VISIT: ***  OBJECTIVE:   DIAGNOSTIC FINDINGS: ***  PATIENT SURVEYS:  {rehab surveys:24030}  COGNITION: Overall cognitive status: {cognition:24006}     SENSATION: {sensation:27233}  EDEMA:  {edema:24020}  MUSCLE LENGTH: Hamstrings: Right *** deg; Left *** deg Thomas test: Right *** deg; Left *** deg  POSTURE: {posture:25561}  PALPATION: ***  LOWER EXTREMITY ROM:  {AROM/PROM:27142} ROM Right eval Left eval  Hip flexion    Hip extension    Hip abduction    Hip adduction    Hip internal rotation    Hip external rotation    Knee flexion    Knee extension    Ankle dorsiflexion    Ankle plantarflexion    Ankle inversion    Ankle eversion     (Blank rows = not tested)  LOWER EXTREMITY MMT:  MMT Right eval Left eval  Hip flexion    Hip extension    Hip abduction    Hip adduction    Hip internal rotation    Hip external rotation    Knee flexion    Knee extension    Ankle dorsiflexion    Ankle plantarflexion    Ankle inversion    Ankle eversion     (Blank rows = not tested)  LOWER EXTREMITY SPECIAL TESTS:  {LEspecialtests:26242}  FUNCTIONAL TESTS:  {Functional  tests:24029}  GAIT: Distance walked: *** Assistive device utilized: {Assistive devices:23999} Level of assistance: {Levels of assistance:24026} Comments: ***   TODAY'S TREATMENT:                                                                                                                              DATE: ***    PATIENT EDUCATION:  Education details: *** Person educated: {Person educated:25204} Education method: {Education Method:25205} Education comprehension: {Education Comprehension:25206}  HOME EXERCISE PROGRAM: ***  ASSESSMENT:  CLINICAL IMPRESSION: Patient is a *** y.o. *** who was seen today for physical therapy evaluation and treatment for ***.   OBJECTIVE IMPAIRMENTS: {opptimpairments:25111}.   ACTIVITY LIMITATIONS: {activitylimitations:27494}  PARTICIPATION LIMITATIONS: {participationrestrictions:25113}  PERSONAL FACTORS: {Personal factors:25162} are also affecting patient's functional outcome.   REHAB POTENTIAL: {rehabpotential:25112}  CLINICAL DECISION MAKING: {clinical decision making:25114}  EVALUATION COMPLEXITY: {Evaluation complexity:25115}   GOALS: Goals reviewed with patient? {yes/no:20286}  SHORT TERM GOALS: Target date: *** *** Baseline: Goal status: INITIAL  2.  *** Baseline:  Goal status: INITIAL  3.  *** Baseline:  Goal status: INITIAL  4.  *** Baseline:  Goal status: INITIAL  5.  *** Baseline:  Goal status: INITIAL  6.  *** Baseline:  Goal status: INITIAL  LONG TERM GOALS: Target date: ***  *** Baseline:  Goal status: INITIAL  2.  *** Baseline:  Goal status: INITIAL  3.  *** Baseline:  Goal status: INITIAL  4.  *** Baseline:  Goal status: INITIAL  5.  *** Baseline:  Goal status: INITIAL  6.  *** Baseline:  Goal status: INITIAL   PLAN:  PT FREQUENCY: {rehab frequency:25116}  PT DURATION: {rehab duration:25117}  PLANNED INTERVENTIONS: {rehab planned interventions:25118::"Therapeutic  exercises","Therapeutic activity","Neuromuscular re-education","Balance training","Gait training","Patient/Family education","Self Care","Joint mobilization"}  PLAN FOR NEXT SESSION: ***   Ermagene Saidi, PT 08/19/2023, 7:25 PM

## 2023-08-20 ENCOUNTER — Ambulatory Visit: Payer: Medicare HMO | Admitting: Physical Therapy

## 2023-08-28 ENCOUNTER — Telehealth: Payer: Self-pay | Admitting: Pulmonary Disease

## 2023-08-28 NOTE — Telephone Encounter (Signed)
Pt calling in bc she wants to trade in resmed machine

## 2023-08-28 NOTE — Telephone Encounter (Signed)
Left message on vm to have patient return call. She needs to find out if she is eligable for a new machine.

## 2023-08-28 NOTE — Telephone Encounter (Signed)
She will also need an appointment to document compliance.

## 2023-09-01 NOTE — Therapy (Signed)
OUTPATIENT PHYSICAL THERAPY LOWER EXTREMITY EVALUATION   Patient Name: [name] [name] MRN: [id number] DOB:11-22-1946, 77 y.o., female Today's Date: 09/08/2023  END OF SESSION:  PT End of Session - 09/08/23 1140     Visit Number 1    Number of Visits 12    Date for PT Re-Evaluation 10/22/23    Authorization Type [employer] MCR    PT Start Time 1100    PT Stop Time 1145    PT Time Calculation (min) 45 min    Activity Tolerance Patient tolerated treatment well;Patient limited by pain    Behavior During Therapy [facility] for tasks assessed/performed             Past Medical History:  Diagnosis Date   Anemia    Arthritis    Asthma    COPD (chronic obstructive pulmonary disease) (HCC)    Diabetes mellitus without complication (HCC)    Headache    Hypertension    OSA (obstructive sleep apnea) 01/30/2018   Pneumonia    Sleep apnea    UTI (urinary tract infection) 2018   Past Surgical History:  Procedure Laterality Date   CESAREAN SECTION     COLONOSCOPY N/A 07/04/2017   Procedure: COLONOSCOPY;  Surgeon: Beverley Fiedler, MD;  Location: [facility];  Service: Gastroenterology;  Laterality: N/A;   ESOPHAGOGASTRODUODENOSCOPY N/A 07/04/2017   Procedure: ESOPHAGOGASTRODUODENOSCOPY (EGD);  Surgeon: Beverley Fiedler, MD;  Location: [facility];  Service: Gastroenterology;  Laterality: N/A;   FOOT SURGERY     Patient Active Problem List   Diagnosis Date Noted   Mixed hyperlipidemia 06/16/2021   Coronary artery disease 05/08/2021   Lumbar spondylosis 11/23/2019   History of trichomonal vaginitis 09/07/2018   Post-menopausal bleeding 08/26/2018   Bilateral sensorineural hearing loss 06/07/2018   Primary osteoarthritis of left knee 02/09/2018   OSA (obstructive sleep apnea) 01/30/2018   Sleep disturbance 12/10/2017   Mild intermittent asthma without complication 09/11/2017   B12 deficiency 09/10/2017   Uterine adenomyoma 07/04/2017   Symptomatic anemia 07/04/2017   Bacteremia, escherichia  coli 07/04/2017   Angiodysplasia of duodenum    Iron deficiency anemia due to chronic blood loss    Rectal bleeding    Sepsis (HCC) 06/29/2017   Acute lower UTI 06/29/2017   Diabetes mellitus type 2 in obese 06/29/2017   Acute cystitis without hematuria    RHINITIS, ALLERGIC 02/18/2007   ASTHMA, UNSPECIFIED 02/18/2007   BACK PAIN, LOW 02/18/2007   LEG PAIN OR KNEE PAIN 02/18/2007   Former smoker 02/18/2007    PCP: Verlon Au, MD   REFERRING PROVIDER: Edwin Cap, DPM   REFERRING DIAG: [phone] (ICD-10-CM) - Achilles tendinitis of left lower extremity   THERAPY DIAG:  Pain in left ankle and joints of left foot - Plan: PT plan of care cert/re-cert  Muscle weakness (generalized) - Plan: PT plan of care cert/re-cert  Difficulty in walking, not elsewhere classified - Plan: PT plan of care cert/re-cert  Rationale for Evaluation and Treatment: Rehabilitation  ONSET DATE: May 2024   SUBJECTIVE:   SUBJECTIVE STATEMENT: I have been having pain on my tendon off and on.  When I bathe or put on socks it hurts to touch it only on my left ankle and Not my Right.  Today I have no pain but when It flares 10/10 pain.  I can't even walk.    Last week I had an injection in my back with Dr Leavy Cella for my " slipped disc" I have had since  1995.  When I have pain in my achilles I can't walk. I lean on my right side.    PERTINENT HISTORY: DM, asthma, HTN, CPAP for OSA, R foot surgery PAIN:  Are you having pain? Yes: NPRS scale: 0/10 at rest and 10/10 with flare/10 Pain location: mid tendon Achilles on Left Pain description: deep ache and sometimes sharp Aggravating factors: walking too longer than, gettting up and down from seat getting in and out of car. Going up and down steps, bathing and putting on socks and doing household chores.  Relieving factors: not walking , resting  PRECAUTIONS: None  RED FLAGS: Bowel or bladder incontinence: No   WEIGHT BEARING RESTRICTIONS:  No  FALLS:  Has patient fallen in last 6 months? No  LIVING ENVIRONMENT: Lives with: lives alone Lives in: House/apartment Stairs: No Has following equipment at home: None  OCCUPATION: retired  PLOF: Independent  PATIENT GOALS: Stop pain so I can get back to walking. I walk 30-45 minutes for exercise but when I have pain I cannot walk  NEXT MD VISIT: TBD  OBJECTIVE:   DIAGNOSTIC FINDINGS: Multiple views x-ray of the left foot: no fracture, dislocation, swelling or degenerative changes noted and calcification of distal Achilles insertion   PATIENT SURVEYS:  FOTO 55%  predicted 67%  COGNITION: Overall cognitive status: Within functional limits for tasks assessed     SENSATION: WFL    MUSCLE LENGTH: Hamstrings: Right 69 deg; Left 69 deg   POSTURE: rounded shoulders, forward head, and flexed trunk   PALPATION: TTP in mid achilles tendon on Left  LOWER EXTREMITY ROM:  Active ROM Right eval Left eval  Hip flexion    Hip extension    Hip abduction    Hip adduction    Hip internal rotation    Hip external rotation    Knee flexion 122/PROM 130 122/ PROM 130  Knee extension    Ankle dorsiflexion 10 +2  Ankle plantarflexion 48 45  Ankle inversion 42 32  Ankle eversion 27 20   (Blank rows = not tested)  LOWER EXTREMITY MMT:  MMT Right eval Left eval  Hip flexion 4 4+  Hip extension 4 4  Hip abduction 4- 4-  Hip adduction    Hip internal rotation    Hip external rotation    Knee flexion 4 4  Knee extension 4 4  Ankle dorsiflexion    Ankle plantarflexion 20/25 12/25  Ankle inversion    Ankle eversion     (Blank rows = not tested)    FUNCTIONAL TESTS:  5 times sit to stand: 34.72 sec  2 minute walk test: 296 ft  ( norm 479 ft)  GAIT: Distance walked: see above 2 MWT  Assistive device utilized: None Level of assistance: Complete Independence Comments: Pt with slow cadence,  Pt transfer from seat with Rt wt bearing and states she feels strain  on R low back with compensatory movements.    TODAY'S TREATMENT:  DATE: 09-08-23  EVAL and issue HEP    PATIENT EDUCATION:  Education details: POC Explanation of findings Person educated: Patient Education method: Explanation, Demonstration, Tactile cues, Verbal cues, and Handouts Education comprehension: verbalized understanding, returned demonstration, verbal cues required, tactile cues required, and needs further education  HOME EXERCISE PROGRAM:  Access Code: [email or web address] URL: [email or web address] Date: 09/08/2023 Prepared by: Garen Lah  Exercises - Seated Plantar Fascia Mobilization with Small Ball  - 1 x daily - 7 x weekly - 2 minute hold - Calf Mobilization with Small Ball  - 1 x daily - 7 x weekly - 2 minute hold - Calf Mobilization with Foam Roll  - 1 x daily - 7 x weekly - 2 minute hold - Seated Plantar Fascia Stretch  - 1 x daily - 7 x weekly - 2 sets - 30 hold  ASSESSMENT:  CLINICAL IMPRESSION: Patient is a 77  y.o. female who was seen today for physical therapy evaluation and treatment for Left achilles tendonitis. Pt reports no pain today at evaluation but occasionally since May 2024 has pain up to 10/10.  Pt does report that she had an injection in back at L4-5 form PCP last week on Right.  Pt has decreased strength bilaterally in B LE and has difficulty with transitional movements. Ms Auen also lives alone and is not able to rise from ground  and cannot remember the last time she was able to do that.  Pt will benefit from skilled PT to address impairments and to strengthen for decrease risk of fall and increased mobility.  OBJECTIVE IMPAIRMENTS: decreased activity tolerance, difficulty walking, decreased ROM, decreased strength, improper body mechanics, postural dysfunction, obesity, and pain.   ACTIVITY LIMITATIONS: bending,  standing, squatting, sleeping, stairs, transfers, bed mobility, reach over head, locomotion level, and caring for others  PARTICIPATION LIMITATIONS: meal prep, cleaning, laundry, driving, shopping, and community activity  PERSONAL FACTORS: DM, asthma, HTN, CPAP for OSA, R foot surgery and obesity are also affecting patient's functional outcome.   REHAB POTENTIAL: Good  CLINICAL DECISION MAKING: Evolving/moderate complexity  EVALUATION COMPLEXITY: Moderate   GOALS: Goals reviewed with patient? Yes  SHORT TERM GOALS: Target date: 09-29-23 Pt will be independent with initial HEP Baseline:no knowledge Goal status: INITIAL  2.  Pt will be able to get in and out of car with 25 % greater ease than on eval Baseline: 5 x STS 34.72 sec Goal status: INITIAL  3.  Sit and stand with RT=LT wt bearing to reduce lumbar strain and allow for increased tolerance for these positions for home tasks Baseline: Pt demo RT wt bear and strain of lumbar on eval Goal status: INITIAL    LONG TERM GOALS: Target date: 10-22-23  Pt will be independent with advanced HEP Baseline: no knowledge Goal status: INITIAL  2.  Pt will be able to demonstrate standing to floor transfer independently since she lives along Baseline: unable to perform floor to stand Goal status: INITIAL  3.  Pt will be able to complete 6 MWT for at least 1200 ft to show improved LE strength and locomotion Baseline: 5 times sit to stand: 34.72 sec  2 minute walk test: 296 ft  ( norm 479 ft) Goal status: INITIAL  4.  FOTO will improve from  55%  to  67%   indicating improved functional mobility.  Baseline: 55% Goal status: INITIAL  5.  Pt will be able to negotiate steps without increasing pain greater than 2/10 Baseline: Pt avoids steps Goal status:  INITIAL  6.  Pt will be able to perform 25/25 SL  heel raise without exacerbation of pain greater than 2/10 Baseline: Pt with 10/10 with SL heel raise Goal status:  INITIAL   PLAN:  PT FREQUENCY: 1-2x/week  PT DURATION: 8 weeks  PLANNED INTERVENTIONS: Therapeutic exercises, Therapeutic activity, Neuromuscular re-education, Balance training, Gait training, Patient/Family education, Self Care, Joint mobilization, Stair training, Dry Needling, Electrical stimulation, Cryotherapy, Moist heat, Taping, Ionotophoresis 4mg /ml Dexamethasone, Manual therapy, and Re-evaluation  PLAN FOR NEXT SESSION: progress with eccentric heel lowering and gasrocnemius , knee and hip strength.  Education on Achilles tendonitis  Garen Lah, PT, ATRIC Certified Exercise Expert for the Aging Adult  09/08/23 2:46 PM Phone: [phone] Fax: [phone]

## 2023-09-04 ENCOUNTER — Ambulatory Visit: Payer: Medicare HMO

## 2023-09-08 ENCOUNTER — Ambulatory Visit: Payer: Medicare HMO | Attending: Podiatry | Admitting: Physical Therapy

## 2023-09-08 ENCOUNTER — Other Ambulatory Visit: Payer: Self-pay

## 2023-09-08 ENCOUNTER — Encounter: Payer: Self-pay | Admitting: Physical Therapy

## 2023-09-08 DIAGNOSIS — M6281 Muscle weakness (generalized): Secondary | ICD-10-CM | POA: Insufficient documentation

## 2023-09-08 DIAGNOSIS — R262 Difficulty in walking, not elsewhere classified: Secondary | ICD-10-CM | POA: Diagnosis present

## 2023-09-08 DIAGNOSIS — M25572 Pain in left ankle and joints of left foot: Secondary | ICD-10-CM | POA: Insufficient documentation

## 2023-09-08 DIAGNOSIS — M7662 Achilles tendinitis, left leg: Secondary | ICD-10-CM | POA: Insufficient documentation

## 2023-09-18 ENCOUNTER — Ambulatory Visit: Payer: Medicare HMO

## 2023-09-18 DIAGNOSIS — M6281 Muscle weakness (generalized): Secondary | ICD-10-CM

## 2023-09-18 DIAGNOSIS — M25572 Pain in left ankle and joints of left foot: Secondary | ICD-10-CM | POA: Diagnosis not present

## 2023-09-18 DIAGNOSIS — R262 Difficulty in walking, not elsewhere classified: Secondary | ICD-10-CM

## 2023-09-18 NOTE — Therapy (Signed)
OUTPATIENT PHYSICAL THERAPY NOTE    Patient Name: [name] [name] MRN: [id number] DOB:02-12-1946, 77 y.o., female Today's Date: 09/18/2023  END OF SESSION:  PT End of Session - 09/18/23 1058     Visit Number 2    Number of Visits 12    Date for PT Re-Evaluation 10/22/23    Authorization Type [employer] MCR    PT Start Time 1002    PT Stop Time 1040    PT Time Calculation (min) 38 min    Activity Tolerance Patient tolerated treatment well;Patient limited by pain    Behavior During Therapy [facility] for tasks assessed/performed              Past Medical History:  Diagnosis Date   Anemia    Arthritis    Asthma    COPD (chronic obstructive pulmonary disease) (HCC)    Diabetes mellitus without complication (HCC)    Headache    Hypertension    OSA (obstructive sleep apnea) 01/30/2018   Pneumonia    Sleep apnea    UTI (urinary tract infection) 2018   Past Surgical History:  Procedure Laterality Date   CESAREAN SECTION     COLONOSCOPY N/A 07/04/2017   Procedure: COLONOSCOPY;  Surgeon: Beverley Fiedler, MD;  Location: [facility];  Service: Gastroenterology;  Laterality: N/A;   ESOPHAGOGASTRODUODENOSCOPY N/A 07/04/2017   Procedure: ESOPHAGOGASTRODUODENOSCOPY (EGD);  Surgeon: Beverley Fiedler, MD;  Location: [facility];  Service: Gastroenterology;  Laterality: N/A;   FOOT SURGERY     Patient Active Problem List   Diagnosis Date Noted   Mixed hyperlipidemia 06/16/2021   Coronary artery disease 05/08/2021   Lumbar spondylosis 11/23/2019   History of trichomonal vaginitis 09/07/2018   Post-menopausal bleeding 08/26/2018   Bilateral sensorineural hearing loss 06/07/2018   Primary osteoarthritis of left knee 02/09/2018   OSA (obstructive sleep apnea) 01/30/2018   Sleep disturbance 12/10/2017   Mild intermittent asthma without complication 09/11/2017   B12 deficiency 09/10/2017   Uterine adenomyoma 07/04/2017   Symptomatic anemia 07/04/2017   Bacteremia, escherichia coli 07/04/2017    Angiodysplasia of duodenum    Iron deficiency anemia due to chronic blood loss    Rectal bleeding    Sepsis (HCC) 06/29/2017   Acute lower UTI 06/29/2017   Diabetes mellitus type 2 in obese 06/29/2017   Acute cystitis without hematuria    RHINITIS, ALLERGIC 02/18/2007   ASTHMA, UNSPECIFIED 02/18/2007   BACK PAIN, LOW 02/18/2007   LEG PAIN OR KNEE PAIN 02/18/2007   Former smoker 02/18/2007    PCP: Verlon Au, MD   REFERRING PROVIDER: Edwin Cap, DPM   REFERRING DIAG: [phone] (ICD-10-CM) - Achilles tendinitis of left lower extremity   THERAPY DIAG:  Pain in left ankle and joints of left foot  Muscle weakness (generalized)  Difficulty in walking, not elsewhere classified  Rationale for Evaluation and Treatment: Rehabilitation  ONSET DATE: May 2024   SUBJECTIVE:   SUBJECTIVE STATEMENT: Patient reporting that she had some pain in her ankles yesterday, but not today. She feels that everything else is aching today.   PERTINENT HISTORY: DM, asthma, HTN, CPAP for OSA, R foot surgery PAIN:  Are you having pain? Yes: NPRS scale: 0/10 at rest and 10/10 with flare/10 Pain location: mid tendon Achilles on Left Pain description: deep ache and sometimes sharp Aggravating factors: walking too longer than, gettting up and down from seat getting in and out of car. Going up and down steps, bathing and putting on socks and doing  household chores.  Relieving factors: not walking , resting  PRECAUTIONS: None  RED FLAGS: Bowel or bladder incontinence: No   WEIGHT BEARING RESTRICTIONS: No  FALLS:  Has patient fallen in last 6 months? No  LIVING ENVIRONMENT: Lives with: lives alone Lives in: House/apartment Stairs: No Has following equipment at home: None  OCCUPATION: retired  PLOF: Independent  PATIENT GOALS: Stop pain so I can get back to walking. I walk 30-45 minutes for exercise but when I have pain I cannot walk.  NEXT MD VISIT: TBD  OBJECTIVE:    DIAGNOSTIC FINDINGS: Multiple views x-ray of the left foot: no fracture, dislocation, swelling or degenerative changes noted and calcification of distal Achilles insertion   PATIENT SURVEYS:  FOTO 55%  predicted 67%  COGNITION: Overall cognitive status: Within functional limits for tasks assessed     SENSATION: WFL   MUSCLE LENGTH: Hamstrings: Right 69 deg; Left 69 deg   POSTURE: rounded shoulders, forward head, and flexed trunk   PALPATION: TTP in mid achilles tendon on Left  LOWER EXTREMITY ROM:  Active ROM Right eval Left eval  Hip flexion    Hip extension    Hip abduction    Hip adduction    Hip internal rotation    Hip external rotation    Knee flexion 122/PROM 130 122/ PROM 130  Knee extension    Ankle dorsiflexion 10 +2  Ankle plantarflexion 48 45  Ankle inversion 42 32  Ankle eversion 27 20   (Blank rows = not tested)  LOWER EXTREMITY MMT:  MMT Right eval Left eval  Hip flexion 4 4+  Hip extension 4 4  Hip abduction 4- 4-  Hip adduction    Hip internal rotation    Hip external rotation    Knee flexion 4 4  Knee extension 4 4  Ankle dorsiflexion    Ankle plantarflexion 20/25 12/25  Ankle inversion    Ankle eversion     (Blank rows = not tested)    FUNCTIONAL TESTS:  5 times sit to stand: 34.72 sec  2 minute walk test: 296 ft  ( norm 479 ft)  GAIT: Distance walked: see above 2 MWT  Assistive device utilized: None Level of assistance: Complete Independence Comments: Pt with slow cadence,  Pt transfer from seat with Rt wt bearing and states she feels strain on R low back with compensatory movements.    TODAY'S TREATMENT:                                                                                                                                [facility] Adult PT Treatment:                                                DATE: 09/18/2023  Therapeutic Exercise: Nustep x 5  minutes  Seated rockerboard AP, 2 x 1 min  Seated calf stretch   Patient education regarding HEP and use of modalities  Manual Therapy: STM Lt gastroc/soleus, achilles tendon Passive achilles stretch    PATIENT EDUCATION:  Education details: POC Explanation of findings Person educated: Patient Education method: Explanation, Demonstration, Tactile cues, Verbal cues, and Handouts Education comprehension: verbalized understanding, returned demonstration, verbal cues required, tactile cues required, and needs further education  HOME EXERCISE PROGRAM:  Access Code: [email or web address] URL: [email or web address] Date: 09/08/2023 Prepared by: Garen Lah  Exercises - Seated Plantar Fascia Mobilization with Small Ball  - 1 x daily - 7 x weekly - 2 minute hold - Calf Mobilization with Small Ball  - 1 x daily - 7 x weekly - 2 minute hold - Calf Mobilization with Foam Roll  - 1 x daily - 7 x weekly - 2 minute hold - Seated Plantar Fascia Stretch  - 1 x daily - 7 x weekly - 2 sets - 30 hold  ASSESSMENT:  CLINICAL IMPRESSION:  Patient had difficulty tolerating exercises today, however, she was able to begin ankle AAROM activities. She was limited with NuStep today d/t body aches. We will reassess tolerance of manual therapy at next visit and continue as indicated. She will benefit from ongoing AAROM activities and stretches as needed.    OBJECTIVE IMPAIRMENTS: decreased activity tolerance, difficulty walking, decreased ROM, decreased strength, improper body mechanics, postural dysfunction, obesity, and pain.   ACTIVITY LIMITATIONS: bending, standing, squatting, sleeping, stairs, transfers, bed mobility, reach over head, locomotion level, and caring for others  PARTICIPATION LIMITATIONS: meal prep, cleaning, laundry, driving, shopping, and community activity  PERSONAL FACTORS: DM, asthma, HTN, CPAP for OSA, R foot surgery and obesity are also affecting patient's functional outcome.   REHAB POTENTIAL: Good  CLINICAL DECISION MAKING:  Evolving/moderate complexity  EVALUATION COMPLEXITY: Moderate   GOALS: Goals reviewed with patient? Yes  SHORT TERM GOALS: Target date: 09-29-23 Pt will be independent with initial HEP Baseline:no knowledge Goal status: INITIAL  2.  Pt will be able to get in and out of car with 25 % greater ease than on eval Baseline: 5 x STS 34.72 sec Goal status: INITIAL  3.  Sit and stand with RT=LT wt bearing to reduce lumbar strain and allow for increased tolerance for these positions for home tasks Baseline: Pt demo RT wt bear and strain of lumbar on eval Goal status: INITIAL    LONG TERM GOALS: Target date: 10-22-23  Pt will be independent with advanced HEP Baseline: no knowledge Goal status: INITIAL  2.  Pt will be able to demonstrate standing to floor transfer independently since she lives along Baseline: unable to perform floor to stand Goal status: INITIAL  3.  Pt will be able to complete 6 MWT for at least 1200 ft to show improved LE strength and locomotion Baseline: 5 times sit to stand: 34.72 sec  2 minute walk test: 296 ft  ( norm 479 ft) Goal status: INITIAL  4.  FOTO will improve from  55%  to  67%   indicating improved functional mobility.  Baseline: 55% Goal status: INITIAL  5.  Pt will be able to negotiate steps without increasing pain greater than 2/10 Baseline: Pt avoids steps Goal status: INITIAL  6.  Pt will be able to perform 25/25 SL  heel raise without exacerbation of pain greater than 2/10 Baseline: Pt with 10/10 with SL heel raise Goal status: INITIAL   PLAN:  PT FREQUENCY: 1-2x/week  PT DURATION: 8 weeks  PLANNED INTERVENTIONS: Therapeutic exercises, Therapeutic activity, Neuromuscular re-education, Balance training, Gait training, Patient/Family education, Self Care, Joint mobilization, Stair training, Dry Needling, Electrical stimulation, Cryotherapy, Moist heat, Taping, Ionotophoresis 4mg /ml Dexamethasone, Manual therapy, and  Re-evaluation  PLAN FOR NEXT SESSION: progress with eccentric heel lowering and gasrocnemius , knee and hip strength.  Education on Achilles tendonitis  Mauri Reading, PT, DPT  09/18/23 11:16 AM Phone: [phone] Fax: [phone]

## 2023-09-23 ENCOUNTER — Ambulatory Visit: Payer: Medicare HMO | Attending: Podiatry

## 2023-09-23 DIAGNOSIS — R262 Difficulty in walking, not elsewhere classified: Secondary | ICD-10-CM | POA: Diagnosis present

## 2023-09-23 DIAGNOSIS — M25572 Pain in left ankle and joints of left foot: Secondary | ICD-10-CM | POA: Diagnosis present

## 2023-09-23 DIAGNOSIS — M6281 Muscle weakness (generalized): Secondary | ICD-10-CM | POA: Diagnosis present

## 2023-09-23 NOTE — Therapy (Signed)
OUTPATIENT PHYSICAL THERAPY NOTE    Patient Name: [name] [name] MRN: [id number] DOB:1946/07/10, 77 y.o., female Today's Date: 09/23/2023  END OF SESSION:  PT End of Session - 09/23/23 1845     Visit Number 3    Number of Visits 12    Date for PT Re-Evaluation 10/22/23    Authorization Type [employer] MCR    PT Start Time 0507    PT Stop Time 0543    PT Time Calculation (min) 36 min    Activity Tolerance Patient tolerated treatment well;Patient limited by pain    Behavior During Therapy [facility] for tasks assessed/performed               Past Medical History:  Diagnosis Date   Anemia    Arthritis    Asthma    COPD (chronic obstructive pulmonary disease) (HCC)    Diabetes mellitus without complication (HCC)    Headache    Hypertension    OSA (obstructive sleep apnea) 01/30/2018   Pneumonia    Sleep apnea    UTI (urinary tract infection) 2018   Past Surgical History:  Procedure Laterality Date   CESAREAN SECTION     COLONOSCOPY N/A 07/04/2017   Procedure: COLONOSCOPY;  Surgeon: Beverley Fiedler, MD;  Location: [facility];  Service: Gastroenterology;  Laterality: N/A;   ESOPHAGOGASTRODUODENOSCOPY N/A 07/04/2017   Procedure: ESOPHAGOGASTRODUODENOSCOPY (EGD);  Surgeon: Beverley Fiedler, MD;  Location: [facility];  Service: Gastroenterology;  Laterality: N/A;   FOOT SURGERY     Patient Active Problem List   Diagnosis Date Noted   Mixed hyperlipidemia 06/16/2021   Coronary artery disease 05/08/2021   Lumbar spondylosis 11/23/2019   History of trichomonal vaginitis 09/07/2018   Post-menopausal bleeding 08/26/2018   Bilateral sensorineural hearing loss 06/07/2018   Primary osteoarthritis of left knee 02/09/2018   OSA (obstructive sleep apnea) 01/30/2018   Sleep disturbance 12/10/2017   Mild intermittent asthma without complication 09/11/2017   B12 deficiency 09/10/2017   Uterine adenomyoma 07/04/2017   Symptomatic anemia 07/04/2017   Bacteremia, escherichia coli 07/04/2017    Angiodysplasia of duodenum    Iron deficiency anemia due to chronic blood loss    Rectal bleeding    Sepsis (HCC) 06/29/2017   Acute lower UTI 06/29/2017   Type 2 diabetes mellitus with obesity (HCC) 06/29/2017   Acute cystitis without hematuria    Allergic rhinitis 02/18/2007   Asthma 02/18/2007   BACK PAIN, LOW 02/18/2007   LEG PAIN OR KNEE PAIN 02/18/2007   Former smoker 02/18/2007    PCP: Verlon Au, MD   REFERRING PROVIDER: Edwin Cap, DPM   REFERRING DIAG: [phone] (ICD-10-CM) - Achilles tendinitis of left lower extremity   THERAPY DIAG:  Pain in left ankle and joints of left foot  Muscle weakness (generalized)  Difficulty in walking, not elsewhere classified  Rationale for Evaluation and Treatment: Rehabilitation  ONSET DATE: May 2024   SUBJECTIVE:   SUBJECTIVE STATEMENT: Patient states that she feels sore all over today.   PERTINENT HISTORY: DM, asthma, HTN, CPAP for OSA, R foot surgery PAIN:  Are you having pain? Yes: NPRS scale: 0/10 at rest and 10/10 with flare/10 Pain location: mid tendon Achilles on Left Pain description: deep ache and sometimes sharp Aggravating factors: walking too longer than, gettting up and down from seat getting in and out of car. Going up and down steps, bathing and putting on socks and doing household chores.  Relieving factors: not walking , resting  PRECAUTIONS: None  RED FLAGS: Bowel or bladder incontinence: No   WEIGHT BEARING RESTRICTIONS: No  FALLS:  Has patient fallen in last 6 months? No  LIVING ENVIRONMENT: Lives with: lives alone Lives in: House/apartment Stairs: No Has following equipment at home: None  OCCUPATION: retired  PLOF: Independent  PATIENT GOALS: Stop pain so I can get back to walking. I walk 30-45 minutes for exercise but when I have pain I cannot walk.  NEXT MD VISIT: TBD  OBJECTIVE:   DIAGNOSTIC FINDINGS: Multiple views x-ray of the left foot: no fracture, dislocation,  swelling or degenerative changes noted and calcification of distal Achilles insertion   PATIENT SURVEYS:  FOTO 55%  predicted 67%  COGNITION: Overall cognitive status: Within functional limits for tasks assessed     SENSATION: WFL   MUSCLE LENGTH: Hamstrings: Right 69 deg; Left 69 deg   POSTURE: rounded shoulders, forward head, and flexed trunk   PALPATION: TTP in mid achilles tendon on Left  LOWER EXTREMITY ROM:  Active ROM Right eval Left eval  Hip flexion    Hip extension    Hip abduction    Hip adduction    Hip internal rotation    Hip external rotation    Knee flexion 122/PROM 130 122/ PROM 130  Knee extension    Ankle dorsiflexion 10 +2  Ankle plantarflexion 48 45  Ankle inversion 42 32  Ankle eversion 27 20   (Blank rows = not tested)  LOWER EXTREMITY MMT:  MMT Right eval Left eval  Hip flexion 4 4+  Hip extension 4 4  Hip abduction 4- 4-  Hip adduction    Hip internal rotation    Hip external rotation    Knee flexion 4 4  Knee extension 4 4  Ankle dorsiflexion    Ankle plantarflexion 20/25 12/25  Ankle inversion    Ankle eversion     (Blank rows = not tested)    FUNCTIONAL TESTS:  5 times sit to stand: 34.72 sec  2 minute walk test: 296 ft  ( norm 479 ft)  GAIT: Distance walked: see above 2 MWT  Assistive device utilized: None Level of assistance: Complete Independence Comments: Pt with slow cadence,  Pt transfer from seat with Rt wt bearing and states she feels strain on R low back with compensatory movements.    TODAY'S TREATMENT:                                                                                                                                [facility] Adult PT Treatment:                                                DATE: 09/23/2023  Therapeutic Exercise: Nustep x 6 minutes at end of session Seated rockerboard AP, 2 x 1 min  Seated calf stretch  Patient education regarding HEP and use of modalities  Manual  Therapy: STM Lt gastroc/soleus, achilles tendon Passive achilles stretch   [facility] Adult PT Treatment:                                                DATE: 09/18/2023  Therapeutic Exercise: Nustep x 5 minutes  Seated rockerboard AP, 2 x 1 min  Seated calf stretch  Patient education regarding HEP and use of modalities  Manual Therapy: STM Lt gastroc/soleus, achilles tendon Passive achilles stretch    PATIENT EDUCATION:  Education details: POC Explanation of findings Person educated: Patient Education method: Explanation, Demonstration, Tactile cues, Verbal cues, and Handouts Education comprehension: verbalized understanding, returned demonstration, verbal cues required, tactile cues required, and needs further education  HOME EXERCISE PROGRAM:  Access Code: [email or web address] URL: [email or web address] Date: 09/08/2023 Prepared by: Garen Lah  Exercises - Seated Plantar Fascia Mobilization with Small Ball  - 1 x daily - 7 x weekly - 2 minute hold - Calf Mobilization with Small Ball  - 1 x daily - 7 x weekly - 2 minute hold - Calf Mobilization with Foam Roll  - 1 x daily - 7 x weekly - 2 minute hold - Seated Plantar Fascia Stretch  - 1 x daily - 7 x weekly - 2 sets - 30 hold  ASSESSMENT:  CLINICAL IMPRESSION:  Patient has some discomfort with seated calf stretch.  She tolerates manual therapy well, and recumbent stepper.  She felt improvement in symptoms following recumbent bike and will benefit from progression of ankle exercises at next visit was focused on stabilization activities.    OBJECTIVE IMPAIRMENTS: decreased activity tolerance, difficulty walking, decreased ROM, decreased strength, improper body mechanics, postural dysfunction, obesity, and pain.   ACTIVITY LIMITATIONS: bending, standing, squatting, sleeping, stairs, transfers, bed mobility, reach over head, locomotion level, and caring for others  PARTICIPATION LIMITATIONS: meal prep, cleaning,  laundry, driving, shopping, and community activity  PERSONAL FACTORS: DM, asthma, HTN, CPAP for OSA, R foot surgery and obesity are also affecting patient's functional outcome.   REHAB POTENTIAL: Good  CLINICAL DECISION MAKING: Evolving/moderate complexity  EVALUATION COMPLEXITY: Moderate   GOALS: Goals reviewed with patient? Yes  SHORT TERM GOALS: Target date: 09-29-23 Pt will be independent with initial HEP Baseline:no knowledge Goal status: INITIAL  2.  Pt will be able to get in and out of car with 25 % greater ease than on eval Baseline: 5 x STS 34.72 sec Goal status: INITIAL  3.  Sit and stand with RT=LT wt bearing to reduce lumbar strain and allow for increased tolerance for these positions for home tasks Baseline: Pt demo RT wt bear and strain of lumbar on eval Goal status: INITIAL    LONG TERM GOALS: Target date: 10-22-23  Pt will be independent with advanced HEP Baseline: no knowledge Goal status: INITIAL  2.  Pt will be able to demonstrate standing to floor transfer independently since she lives along Baseline: unable to perform floor to stand Goal status: INITIAL  3.  Pt will be able to complete 6 MWT for at least 1200 ft to show improved LE strength and locomotion Baseline: 5 times sit to stand: 34.72 sec  2 minute walk test: 296 ft  ( norm 479 ft) Goal status: INITIAL  4.  FOTO will improve from  55%  to  67%   indicating improved functional mobility.  Baseline: 55% Goal status: INITIAL  5.  Pt will be able to negotiate steps without increasing pain greater than 2/10 Baseline: Pt avoids steps Goal status: INITIAL  6.  Pt will be able to perform 25/25 SL  heel raise without exacerbation of pain greater than 2/10 Baseline: Pt with 10/10 with SL heel raise Goal status: INITIAL   PLAN:  PT FREQUENCY: 1-2x/week  PT DURATION: 8 weeks  PLANNED INTERVENTIONS: Therapeutic exercises, Therapeutic activity, Neuromuscular re-education, Balance training,  Gait training, Patient/Family education, Self Care, Joint mobilization, Stair training, Dry Needling, Electrical stimulation, Cryotherapy, Moist heat, Taping, Ionotophoresis 4mg /ml Dexamethasone, Manual therapy, and Re-evaluation  PLAN FOR NEXT SESSION: progress with eccentric heel lowering and gasrocnemius , knee and hip strength.  Education on Achilles tendonitis   Mauri Reading, PT, DPT  09/23/23 6:49 PM Phone: [phone] Fax: [phone]

## 2023-09-24 ENCOUNTER — Ambulatory Visit: Payer: Medicare HMO | Admitting: Podiatry

## 2023-09-25 ENCOUNTER — Encounter: Payer: Self-pay | Admitting: Physical Therapy

## 2023-09-25 ENCOUNTER — Ambulatory Visit: Payer: Medicare HMO | Admitting: Physical Therapy

## 2023-09-25 DIAGNOSIS — M6281 Muscle weakness (generalized): Secondary | ICD-10-CM

## 2023-09-25 DIAGNOSIS — R262 Difficulty in walking, not elsewhere classified: Secondary | ICD-10-CM

## 2023-09-25 DIAGNOSIS — M25572 Pain in left ankle and joints of left foot: Secondary | ICD-10-CM

## 2023-09-25 NOTE — Therapy (Unsigned)
OUTPATIENT PHYSICAL THERAPY NOTE    Patient Name: [name] [name] MRN: [id number] DOB:May 12, 1946, 77 y.o., female Today's Date: 09/26/2023  END OF SESSION:  PT End of Session - 09/25/23 1031     Visit Number 4    Number of Visits 12    Date for PT Re-Evaluation 10/22/23    Authorization Type [employer] MCR    PT Start Time 1031    PT Stop Time 1112    PT Time Calculation (min) 41 min    Activity Tolerance Patient tolerated treatment well;Patient limited by pain    Behavior During Therapy [facility] for tasks assessed/performed               Past Medical History:  Diagnosis Date   Anemia    Arthritis    Asthma    COPD (chronic obstructive pulmonary disease) (HCC)    Diabetes mellitus without complication (HCC)    Headache    Hypertension    OSA (obstructive sleep apnea) 01/30/2018   Pneumonia    Sleep apnea    UTI (urinary tract infection) 2018   Past Surgical History:  Procedure Laterality Date   CESAREAN SECTION     COLONOSCOPY N/A 07/04/2017   Procedure: COLONOSCOPY;  Surgeon: Beverley Fiedler, MD;  Location: [facility];  Service: Gastroenterology;  Laterality: N/A;   ESOPHAGOGASTRODUODENOSCOPY N/A 07/04/2017   Procedure: ESOPHAGOGASTRODUODENOSCOPY (EGD);  Surgeon: Beverley Fiedler, MD;  Location: [facility];  Service: Gastroenterology;  Laterality: N/A;   FOOT SURGERY     Patient Active Problem List   Diagnosis Date Noted   Mixed hyperlipidemia 06/16/2021   Coronary artery disease 05/08/2021   Lumbar spondylosis 11/23/2019   History of trichomonal vaginitis 09/07/2018   Post-menopausal bleeding 08/26/2018   Bilateral sensorineural hearing loss 06/07/2018   Primary osteoarthritis of left knee 02/09/2018   OSA (obstructive sleep apnea) 01/30/2018   Sleep disturbance 12/10/2017   Mild intermittent asthma without complication 09/11/2017   B12 deficiency 09/10/2017   Uterine adenomyoma 07/04/2017   Symptomatic anemia 07/04/2017   Bacteremia, escherichia coli 07/04/2017    Angiodysplasia of duodenum    Iron deficiency anemia due to chronic blood loss    Rectal bleeding    Sepsis (HCC) 06/29/2017   Acute lower UTI 06/29/2017   Type 2 diabetes mellitus with obesity (HCC) 06/29/2017   Acute cystitis without hematuria    Allergic rhinitis 02/18/2007   Asthma 02/18/2007   BACK PAIN, LOW 02/18/2007   LEG PAIN OR KNEE PAIN 02/18/2007   Former smoker 02/18/2007    PCP: Verlon Au, MD   REFERRING PROVIDER: Edwin Cap, DPM   REFERRING DIAG: [id number] (ICD-10-CM) - Achilles tendinitis of left lower extremity   THERAPY DIAG:  Pain in left ankle and joints of left foot  Muscle weakness (generalized)  Difficulty in walking, not elsewhere classified  Rationale for Evaluation and Treatment: Rehabilitation  ONSET DATE: May 2024   SUBJECTIVE:   SUBJECTIVE STATEMENT: Patient states that she feels like therapy has improved her sxs.  She went out yesterday and wearing heals, had no pain.  She rates her current pain 2/10.  PERTINENT HISTORY: DM, asthma, HTN, CPAP for OSA, R foot surgery PAIN:  Are you having pain? Yes: NPRS scale: 0/10 at rest and 10/10 with flare/10 Pain location: mid tendon Achilles on Left Pain description: deep ache and sometimes sharp Aggravating factors: walking too longer than, gettting up and down from seat getting in and out of car. Going up and down steps,  bathing and putting on socks and doing household chores.  Relieving factors: not walking , resting  PRECAUTIONS: None  RED FLAGS: Bowel or bladder incontinence: No   WEIGHT BEARING RESTRICTIONS: No  FALLS:  Has patient fallen in last 6 months? No  LIVING ENVIRONMENT: Lives with: lives alone Lives in: House/apartment Stairs: No Has following equipment at home: None  OCCUPATION: retired  PLOF: Independent  PATIENT GOALS: Stop pain so I can get back to walking. I walk 30-45 minutes for exercise but when I have pain I cannot walk.  NEXT MD VISIT:  TBD  OBJECTIVE:   DIAGNOSTIC FINDINGS: Multiple views x-ray of the left foot: no fracture, dislocation, swelling or degenerative changes noted and calcification of distal Achilles insertion   PATIENT SURVEYS:  FOTO 55%  predicted 67%  COGNITION: Overall cognitive status: Within functional limits for tasks assessed     SENSATION: WFL   MUSCLE LENGTH: Hamstrings: Right 69 deg; Left 69 deg   POSTURE: rounded shoulders, forward head, and flexed trunk   PALPATION: TTP in mid achilles tendon on Left  LOWER EXTREMITY ROM:  Active ROM Right eval Left eval  Hip flexion    Hip extension    Hip abduction    Hip adduction    Hip internal rotation    Hip external rotation    Knee flexion 122/PROM 130 122/ PROM 130  Knee extension    Ankle dorsiflexion 10 +2  Ankle plantarflexion 48 45  Ankle inversion 42 32  Ankle eversion 27 20   (Blank rows = not tested)  LOWER EXTREMITY MMT:  MMT Right eval Left eval  Hip flexion 4 4+  Hip extension 4 4  Hip abduction 4- 4-  Hip adduction    Hip internal rotation    Hip external rotation    Knee flexion 4 4  Knee extension 4 4  Ankle dorsiflexion    Ankle plantarflexion 20/25 12/25  Ankle inversion    Ankle eversion     (Blank rows = not tested)    FUNCTIONAL TESTS:  5 times sit to stand: 34.72 sec  2 minute walk test: 296 ft  ( norm 479 ft)  GAIT: Distance walked: see above 2 MWT  Assistive device utilized: None Level of assistance: Complete Independence Comments: Pt with slow cadence,  Pt transfer from seat with Rt wt bearing and states she feels strain on R low back with compensatory movements.    TODAY'S TREATMENT:                                                                                                                                [facility] Adult PT Treatment:                                                DATE:  09/25/2023  Therapeutic Exercise: Nustep x 6 minutes at end of session Seated rockerboard AP,  3 x 1 min  Seated calf stretch  Ankle 4 way - GTB - 2x15  Manual Therapy: STM and IASTM Lt gastroc/soleus, achilles tendon Passive achilles stretch   [facility] Adult PT Treatment:                                                DATE: 09/18/2023  Therapeutic Exercise: Nustep x 5 minutes  Seated rockerboard AP, 2 x 1 min  Seated calf stretch  Patient education regarding HEP and use of modalities  Manual Therapy: STM Lt gastroc/soleus, achilles tendon Passive achilles stretch    PATIENT EDUCATION:  Education details: POC Explanation of findings Person educated: Patient Education method: Explanation, Demonstration, Tactile cues, Verbal cues, and Handouts Education comprehension: verbalized understanding, returned demonstration, verbal cues required, tactile cues required, and needs further education  HOME EXERCISE PROGRAM:  Access Code: [email or web address] URL: [email or web address] Date: 09/08/2023 Prepared by: Garen Lah  Exercises - Seated Plantar Fascia Mobilization with Small Ball  - 1 x daily - 7 x weekly - 2 minute hold - Calf Mobilization with Small Ball  - 1 x daily - 7 x weekly - 2 minute hold - Calf Mobilization with Foam Roll  - 1 x daily - 7 x weekly - 2 minute hold - Seated Plantar Fascia Stretch  - 1 x daily - 7 x weekly - 2 sets - 30 hold  ASSESSMENT:  CLINICAL IMPRESSION:  Pt reports consistent improvement in achilles pain since stating therapy.   Continued with manual therapy after which pt reports significant reduction in pain.  Added in some light resistance exercises with ankle 4 way.  This was tolerated well but pt reports significant fatigue with these exercises.  Discussed importance of calf mobility in relation to walking and stress on achilles; well received by pt.     OBJECTIVE IMPAIRMENTS: decreased activity tolerance, difficulty walking, decreased ROM, decreased strength, improper body mechanics, postural dysfunction, obesity, and pain.    ACTIVITY LIMITATIONS: bending, standing, squatting, sleeping, stairs, transfers, bed mobility, reach over head, locomotion level, and caring for others  PARTICIPATION LIMITATIONS: meal prep, cleaning, laundry, driving, shopping, and community activity  PERSONAL FACTORS: DM, asthma, HTN, CPAP for OSA, R foot surgery and obesity are also affecting patient's functional outcome.   REHAB POTENTIAL: Good  CLINICAL DECISION MAKING: Evolving/moderate complexity  EVALUATION COMPLEXITY: Moderate   GOALS: Goals reviewed with patient? Yes  SHORT TERM GOALS: Target date: 09-29-23 Pt will be independent with initial HEP Baseline:no knowledge Goal status: INITIAL  2.  Pt will be able to get in and out of car with 25 % greater ease than on eval Baseline: 5 x STS 34.72 sec Goal status: INITIAL  3.  Sit and stand with RT=LT wt bearing to reduce lumbar strain and allow for increased tolerance for these positions for home tasks Baseline: Pt demo RT wt bear and strain of lumbar on eval Goal status: INITIAL    LONG TERM GOALS: Target date: 10-22-23  Pt will be independent with advanced HEP Baseline: no knowledge Goal status: INITIAL  2.  Pt will be able to demonstrate standing to floor transfer independently since she lives along Baseline: unable to perform floor to stand Goal status: INITIAL  3.  Pt will be able to complete 6  MWT for at least 1200 ft to show improved LE strength and locomotion Baseline: 5 times sit to stand: 34.72 sec  2 minute walk test: 296 ft  ( norm 479 ft) Goal status: INITIAL  4.  FOTO will improve from  55%  to  67%   indicating improved functional mobility.  Baseline: 55% Goal status: INITIAL  5.  Pt will be able to negotiate steps without increasing pain greater than 2/10 Baseline: Pt avoids steps Goal status: INITIAL  6.  Pt will be able to perform 25/25 SL  heel raise without exacerbation of pain greater than 2/10 Baseline: Pt with 10/10 with SL heel  raise Goal status: INITIAL   PLAN:  PT FREQUENCY: 1-2x/week  PT DURATION: 8 weeks  PLANNED INTERVENTIONS: Therapeutic exercises, Therapeutic activity, Neuromuscular re-education, Balance training, Gait training, Patient/Family education, Self Care, Joint mobilization, Stair training, Dry Needling, Electrical stimulation, Cryotherapy, Moist heat, Taping, Ionotophoresis 4mg /ml Dexamethasone, Manual therapy, and Re-evaluation  PLAN FOR NEXT SESSION: progress with eccentric heel lowering and gasrocnemius , knee and hip strength.  Education on Achilles tendonitis   Fredderick Phenix PT 09/26/23 8:08 AM Phone: [phone] Fax: [phone]

## 2023-09-26 ENCOUNTER — Encounter: Payer: Self-pay | Admitting: Physical Therapy

## 2023-09-28 NOTE — Therapy (Signed)
OUTPATIENT PHYSICAL THERAPY NOTE    Patient Name: [name] [name] MRN: [id number] DOB:07-Oct-1946, 77 y.o., female Today's Date: 10/02/2023  END OF SESSION:  PT End of Session - 10/02/23 1002     Visit Number 6    Number of Visits 12    Date for PT Re-Evaluation 10/22/23    Authorization Type [employer] MCR    PT Start Time 1000    PT Stop Time 1040    PT Time Calculation (min) 40 min    Activity Tolerance Patient tolerated treatment well;Patient limited by pain    Behavior During Therapy [facility] for tasks assessed/performed                Past Medical History:  Diagnosis Date   Anemia    Arthritis    Asthma    COPD (chronic obstructive pulmonary disease) (HCC)    Diabetes mellitus without complication (HCC)    Headache    Hypertension    OSA (obstructive sleep apnea) 01/30/2018   Pneumonia    Sleep apnea    UTI (urinary tract infection) 2018   Past Surgical History:  Procedure Laterality Date   CESAREAN SECTION     COLONOSCOPY N/A 07/04/2017   Procedure: COLONOSCOPY;  Surgeon: Beverley Fiedler, MD;  Location: [facility];  Service: Gastroenterology;  Laterality: N/A;   ESOPHAGOGASTRODUODENOSCOPY N/A 07/04/2017   Procedure: ESOPHAGOGASTRODUODENOSCOPY (EGD);  Surgeon: Beverley Fiedler, MD;  Location: [facility];  Service: Gastroenterology;  Laterality: N/A;   FOOT SURGERY     Patient Active Problem List   Diagnosis Date Noted   Mixed hyperlipidemia 06/16/2021   Coronary artery disease 05/08/2021   Lumbar spondylosis 11/23/2019   History of trichomonal vaginitis 09/07/2018   Post-menopausal bleeding 08/26/2018   Bilateral sensorineural hearing loss 06/07/2018   Primary osteoarthritis of left knee 02/09/2018   OSA (obstructive sleep apnea) 01/30/2018   Sleep disturbance 12/10/2017   Mild intermittent asthma without complication 09/11/2017   B12 deficiency 09/10/2017   Uterine adenomyoma 07/04/2017   Symptomatic anemia 07/04/2017   Bacteremia, escherichia coli 07/04/2017    Angiodysplasia of duodenum    Iron deficiency anemia due to chronic blood loss    Rectal bleeding    Sepsis (HCC) 06/29/2017   Acute lower UTI 06/29/2017   Type 2 diabetes mellitus with obesity (HCC) 06/29/2017   Acute cystitis without hematuria    Allergic rhinitis 02/18/2007   Asthma 02/18/2007   BACK PAIN, LOW 02/18/2007   LEG PAIN OR KNEE PAIN 02/18/2007   Former smoker 02/18/2007    PCP: Verlon Au, MD   REFERRING PROVIDER: Edwin Cap, DPM   REFERRING DIAG: [id number] (ICD-10-CM) - Achilles tendinitis of left lower extremity   THERAPY DIAG:  Pain in left ankle and joints of left foot  Muscle weakness (generalized)  Difficulty in walking, not elsewhere classified  Rationale for Evaluation and Treatment: Rehabilitation  ONSET DATE: May 2024   SUBJECTIVE:   SUBJECTIVE STATEMENT: Symptoms increased to 7/10 following an increase in activity level and walking.  Symptoms totally resolved this AM.  PERTINENT HISTORY: DM, asthma, HTN, CPAP for OSA, R foot surgery PAIN:  Are you having pain? Yes: NPRS scale: 0/10 at rest and 10/10 with flare/10 Pain location: mid tendon Achilles on Left Pain description: deep ache and sometimes sharp Aggravating factors: walking too longer than, gettting up and down from seat getting in and out of car. Going up and down steps, bathing and putting on socks and doing household chores.  Relieving factors: not walking , resting  PRECAUTIONS: None  RED FLAGS: Bowel or bladder incontinence: No   WEIGHT BEARING RESTRICTIONS: No  FALLS:  Has patient fallen in last 6 months? No  LIVING ENVIRONMENT: Lives with: lives alone Lives in: House/apartment Stairs: No Has following equipment at home: None  OCCUPATION: retired  PLOF: Independent  PATIENT GOALS: Stop pain so I can get back to walking. I walk 30-45 minutes for exercise but when I have pain I cannot walk.  NEXT MD VISIT: TBD  OBJECTIVE:   DIAGNOSTIC FINDINGS:  Multiple views x-ray of the left foot: no fracture, dislocation, swelling or degenerative changes noted and calcification of distal Achilles insertion   PATIENT SURVEYS:  FOTO 55%  predicted 67% 10/02/23 77%  COGNITION: Overall cognitive status: Within functional limits for tasks assessed     SENSATION: WFL   MUSCLE LENGTH: Hamstrings: Right 69 deg; Left 69 deg   POSTURE: rounded shoulders, forward head, and flexed trunk   PALPATION: TTP in mid achilles tendon on Left  LOWER EXTREMITY ROM:  Active ROM Right eval Left eval  Hip flexion    Hip extension    Hip abduction    Hip adduction    Hip internal rotation    Hip external rotation    Knee flexion 122/PROM 130 122/ PROM 130  Knee extension    Ankle dorsiflexion 10 +2  Ankle plantarflexion 48 45  Ankle inversion 42 32  Ankle eversion 27 20   (Blank rows = not tested)  LOWER EXTREMITY MMT:  MMT Right eval Left eval  Hip flexion 4 4+  Hip extension 4 4  Hip abduction 4- 4-  Hip adduction    Hip internal rotation    Hip external rotation    Knee flexion 4 4  Knee extension 4 4  Ankle dorsiflexion    Ankle plantarflexion 20/25 12/25  Ankle inversion    Ankle eversion     (Blank rows = not tested)    FUNCTIONAL TESTS:  5 times sit to stand: 34.72 sec  2 minute walk test: 296 ft  ( norm 479 ft)  GAIT: Distance walked: see above 2 MWT  Assistive device utilized: None Level of assistance: Complete Independence Comments: Pt with slow cadence,  Pt transfer from seat with Rt wt bearing and states she feels strain on R low back with compensatory movements.    TODAY'S TREATMENT:     [facility] Adult PT Treatment:                                                DATE: 10/02/23 Therapeutic Exercise: Nustep L2 x 6 min Seated rockerboard AP, 3 x 1 min(2s hold in DF) Seated calf stretch with strap on 4 in riser 30s x3 Ankle 4 way - GTB - x15 Manual Therapy: STM/CFM L gastroc and achilles insertion 8 min  [facility] Adult PT Treatment:                                                DATE: 09/25/2023  Therapeutic Exercise: Nustep x 6 minutes at end of session Seated rockerboard AP, 3 x 1 min  Seated calf stretch  Ankle 4 way - GTB - 2x15  Manual Therapy: STM and IASTM Lt gastroc/soleus, achilles tendon Passive achilles stretch   [facility] Adult PT Treatment:                                                DATE: 09/18/2023  Therapeutic Exercise: Nustep x 5 minutes  Seated rockerboard AP, 2 x 1 min  Seated calf stretch  Patient education regarding HEP and use of modalities  Manual Therapy: STM Lt gastroc/soleus, achilles tendon Passive achilles stretch    PATIENT EDUCATION:  Education details: POC Explanation of findings Person educated: Patient Education method: Explanation, Demonstration, Tactile cues, Verbal cues, and Handouts Education comprehension: verbalized understanding, returned demonstration, verbal cues required, tactile cues required, and needs further education  HOME EXERCISE PROGRAM:  Access Code: [email or web address] URL: [email or web address] Date: 09/08/2023 Prepared by: Garen Lah  Exercises - Seated Plantar Fascia Mobilization with Small Ball  - 1 x daily - 7 x weekly - 2 minute hold - Calf Mobilization with Small Ball  - 1 x daily - 7 x weekly - 2 minute hold - Calf Mobilization with Foam Roll  - 1 x daily - 7 x weekly - 2 minute hold - Seated Plantar Fascia Stretch  - 1 x daily - 7 x weekly - 2 sets - 30 hold  ASSESSMENT:  CLINICAL IMPRESSION:  FOTO goal met.  Symptoms minimal today.  Continued concentric strengthening and stretching tasks supplemented with STM.Marland Kitchen Able to complete all tasks w/o aggravation of symptoms  Pt reports consistent improvement in achilles pain since stating therapy.   Continued with manual therapy after which pt  reports significant reduction in pain.  Added in some light resistance exercises with ankle 4 way.  This was tolerated well but pt reports significant fatigue with these exercises.  Discussed importance of calf mobility in relation to walking and stress on achilles; well received by pt.     OBJECTIVE IMPAIRMENTS: decreased activity tolerance, difficulty walking, decreased ROM, decreased strength, improper body mechanics, postural dysfunction, obesity, and pain.   ACTIVITY LIMITATIONS: bending, standing, squatting, sleeping, stairs, transfers, bed mobility, reach over head, locomotion level, and caring for others  PARTICIPATION LIMITATIONS: meal prep, cleaning, laundry, driving, shopping, and community activity  PERSONAL FACTORS: DM, asthma, HTN, CPAP for OSA, R foot surgery and obesity are also affecting patient's functional outcome.   REHAB POTENTIAL: Good  CLINICAL DECISION MAKING: Evolving/moderate complexity  EVALUATION COMPLEXITY: Moderate   GOALS: Goals reviewed with patient? Yes  SHORT TERM GOALS: Target date: 09-29-23 Pt will be independent with initial HEP Baseline:no knowledge Goal status: INITIAL  2.  Pt will be able to get in and out of car with 25 % greater ease than on eval Baseline: 5 x STS 34.72 sec; 10/02/23 Still problematic but not from ankle region Goal status: INITIAL  3.  Sit and stand with RT=LT wt bearing to reduce lumbar strain and  allow for increased tolerance for these positions for home tasks Baseline: Pt demo RT wt bear and strain of lumbar on eval Goal status: INITIAL    LONG TERM GOALS: Target date: 10-22-23  Pt will be independent with advanced HEP Baseline: no knowledge Goal status: INITIAL  2.  Pt will be able to demonstrate standing to floor transfer independently since she lives along Baseline: unable to perform floor to stand Goal status: INITIAL  3.  Pt will be able to complete 6 MWT for at least 1200 ft to show improved LE strength  and locomotion Baseline: 5 times sit to stand: 34.72 sec  2 minute walk test: 296 ft  ( norm 479 ft) Goal status: INITIAL  4.  FOTO will improve from  55%  to  67%   indicating improved functional mobility.  Baseline: 55%; 10/02/23 77% Goal status: Met  5.  Pt will be able to negotiate steps without increasing pain greater than 2/10 Baseline: Pt avoids steps Goal status: INITIAL  6.  Pt will be able to perform 25/25 SL  heel raise without exacerbation of pain greater than 2/10 Baseline: Pt with 10/10 with SL heel raise Goal status: INITIAL   PLAN:  PT FREQUENCY: 1-2x/week  PT DURATION: 8 weeks  PLANNED INTERVENTIONS: Therapeutic exercises, Therapeutic activity, Neuromuscular re-education, Balance training, Gait training, Patient/Family education, Self Care, Joint mobilization, Stair training, Dry Needling, Electrical stimulation, Cryotherapy, Moist heat, Taping, Ionotophoresis 4mg /ml Dexamethasone, Manual therapy, and Re-evaluation  PLAN FOR NEXT SESSION: progress with eccentric heel lowering and gasrocnemius , knee and hip strength.  Education on Achilles tendonitis   Hildred Laser PT 10/02/23 10:47 AM Phone: [phone] Fax: [phone]

## 2023-09-30 ENCOUNTER — Ambulatory Visit: Payer: Medicare HMO

## 2023-09-30 DIAGNOSIS — R262 Difficulty in walking, not elsewhere classified: Secondary | ICD-10-CM

## 2023-09-30 DIAGNOSIS — M25572 Pain in left ankle and joints of left foot: Secondary | ICD-10-CM | POA: Diagnosis not present

## 2023-09-30 DIAGNOSIS — M6281 Muscle weakness (generalized): Secondary | ICD-10-CM

## 2023-09-30 NOTE — Therapy (Signed)
OUTPATIENT PHYSICAL THERAPY NOTE    Patient Name: [name] [name] MRN: [id number] DOB:1946/07/15, 77 y.o., female Today's Date: 09/30/2023  END OF SESSION:  PT End of Session - 09/30/23 1702     Visit Number 5    Number of Visits 12    Date for PT Re-Evaluation 10/22/23    Authorization Type [employer] MCR    PT Start Time 1702    PT Stop Time 1742    PT Time Calculation (min) 40 min    Activity Tolerance Patient tolerated treatment well;Patient limited by pain    Behavior During Therapy [facility] for tasks assessed/performed                Past Medical History:  Diagnosis Date   Anemia    Arthritis    Asthma    COPD (chronic obstructive pulmonary disease) (HCC)    Diabetes mellitus without complication (HCC)    Headache    Hypertension    OSA (obstructive sleep apnea) 01/30/2018   Pneumonia    Sleep apnea    UTI (urinary tract infection) 2018   Past Surgical History:  Procedure Laterality Date   CESAREAN SECTION     COLONOSCOPY N/A 07/04/2017   Procedure: COLONOSCOPY;  Surgeon: Beverley Fiedler, MD;  Location: [facility];  Service: Gastroenterology;  Laterality: N/A;   ESOPHAGOGASTRODUODENOSCOPY N/A 07/04/2017   Procedure: ESOPHAGOGASTRODUODENOSCOPY (EGD);  Surgeon: Beverley Fiedler, MD;  Location: [facility];  Service: Gastroenterology;  Laterality: N/A;   FOOT SURGERY     Patient Active Problem List   Diagnosis Date Noted   Mixed hyperlipidemia 06/16/2021   Coronary artery disease 05/08/2021   Lumbar spondylosis 11/23/2019   History of trichomonal vaginitis 09/07/2018   Post-menopausal bleeding 08/26/2018   Bilateral sensorineural hearing loss 06/07/2018   Primary osteoarthritis of left knee 02/09/2018   OSA (obstructive sleep apnea) 01/30/2018   Sleep disturbance 12/10/2017   Mild intermittent asthma without complication 09/11/2017   B12 deficiency 09/10/2017   Uterine adenomyoma 07/04/2017   Symptomatic anemia 07/04/2017   Bacteremia, escherichia coli 07/04/2017    Angiodysplasia of duodenum    Iron deficiency anemia due to chronic blood loss    Rectal bleeding    Sepsis (HCC) 06/29/2017   Acute lower UTI 06/29/2017   Type 2 diabetes mellitus with obesity (HCC) 06/29/2017   Acute cystitis without hematuria    Allergic rhinitis 02/18/2007   Asthma 02/18/2007   BACK PAIN, LOW 02/18/2007   LEG PAIN OR KNEE PAIN 02/18/2007   Former smoker 02/18/2007    PCP: Verlon Au, MD   REFERRING PROVIDER: Edwin Cap, DPM   REFERRING DIAG: [phone] (ICD-10-CM) - Achilles tendinitis of left lower extremity   THERAPY DIAG:  Pain in left ankle and joints of left foot  Muscle weakness (generalized)  Difficulty in walking, not elsewhere classified  Rationale for Evaluation and Treatment: Rehabilitation  ONSET DATE: May 2024   SUBJECTIVE:   SUBJECTIVE STATEMENT: Patient reporting to PT after workday with low pain severity. She does however, note some tenderness along her Lt achilles tendon with removing shoes.   PERTINENT HISTORY: DM, asthma, HTN, CPAP for OSA, R foot surgery PAIN:  Are you having pain? Yes: NPRS scale: 0/10 at rest and 10/10 with flare/10 Pain location: mid tendon Achilles on Left Pain description: deep ache and sometimes sharp Aggravating factors: walking too longer than, gettting up and down from seat getting in and out of car. Going up and down steps, bathing and putting  on socks and doing household chores.  Relieving factors: not walking , resting  PRECAUTIONS: None  RED FLAGS: Bowel or bladder incontinence: No   WEIGHT BEARING RESTRICTIONS: No  FALLS:  Has patient fallen in last 6 months? No  LIVING ENVIRONMENT: Lives with: lives alone Lives in: House/apartment Stairs: No Has following equipment at home: None  OCCUPATION: working at group home  PLOF: Independent  PATIENT GOALS: Stop pain so I can get back to walking. I walk 30-45 minutes for exercise but when I have pain I cannot walk.  NEXT MD  VISIT: TBD  OBJECTIVE:   DIAGNOSTIC FINDINGS: Multiple views x-ray of the left foot: no fracture, dislocation, swelling or degenerative changes noted and calcification of distal Achilles insertion   PATIENT SURVEYS:  FOTO 55%  predicted 67%  COGNITION: Overall cognitive status: Within functional limits for tasks assessed     SENSATION: WFL   MUSCLE LENGTH: Hamstrings: Right 69 deg; Left 69 deg   POSTURE: rounded shoulders, forward head, and flexed trunk   PALPATION: TTP in mid achilles tendon on Left  LOWER EXTREMITY ROM:  Active ROM Right eval Left eval  Hip flexion    Hip extension    Hip abduction    Hip adduction    Hip internal rotation    Hip external rotation    Knee flexion 122/PROM 130 122/ PROM 130  Knee extension    Ankle dorsiflexion 10 +2  Ankle plantarflexion 48 45  Ankle inversion 42 32  Ankle eversion 27 20   (Blank rows = not tested)  LOWER EXTREMITY MMT:  MMT Right eval Left eval  Hip flexion 4 4+  Hip extension 4 4  Hip abduction 4- 4-  Hip adduction    Hip internal rotation    Hip external rotation    Knee flexion 4 4  Knee extension 4 4  Ankle dorsiflexion    Ankle plantarflexion 20/25 12/25  Ankle inversion    Ankle eversion     (Blank rows = not tested)    FUNCTIONAL TESTS:  5 times sit to stand: 34.72 sec  2 minute walk test: 296 ft  ( norm 479 ft)  GAIT: Distance walked: see above 2 MWT  Assistive device utilized: None Level of assistance: Complete Independence Comments: Pt with slow cadence,  Pt transfer from seat with Rt wt bearing and states she feels strain on R low back with compensatory movements.    TODAY'S TREATMENT:                                                                                                                               [facility] Adult PT Treatment:                                                DATE: 09/30/2023  Therapeutic Exercise: Seated ankle circles x 10 each cw/ccw Seated calf  stretch 3 x 20sec  Ankle 4 way - blue TB - x15 Nustep x 6 minutes   Manual Therapy: STM and IASTM Lt gastroc/soleus, achilles tendon Passive achilles stretch  [facility] Adult PT Treatment:                                                DATE: 09/25/2023  Therapeutic Exercise: Nustep x 6 minutes at end of session Seated rockerboard AP, 3 x 1 min  Seated calf stretch  Ankle 4 way - GTB - 2x15  Manual Therapy: STM and IASTM Lt gastroc/soleus, achilles tendon Passive achilles stretch   [facility] Adult PT Treatment:                                                DATE: 09/18/2023  Therapeutic Exercise: Nustep x 5 minutes  Seated rockerboard AP, 2 x 1 min  Seated calf stretch  Patient education regarding HEP and use of modalities  Manual Therapy: STM Lt gastroc/soleus, achilles tendon Passive achilles stretch    PATIENT EDUCATION:  Education details: POC Explanation of findings Person educated: Patient Education method: Explanation, Demonstration, Tactile cues, Verbal cues, and Handouts Education comprehension: verbalized understanding, returned demonstration, verbal cues required, tactile cues required, and needs further education  HOME EXERCISE PROGRAM:  Access Code: [email or web address] URL: [email or web address] Date: 09/08/2023 Prepared by: Garen Lah  Exercises - Seated Plantar Fascia Mobilization with Small Ball  - 1 x daily - 7 x weekly - 2 minute hold - Calf Mobilization with Small Ball  - 1 x daily - 7 x weekly - 2 minute hold - Calf Mobilization with Foam Roll  - 1 x daily - 7 x weekly - 2 minute hold - Seated Plantar Fascia Stretch  - 1 x daily - 7 x weekly - 2 sets - 30 hold  ASSESSMENT:  CLINICAL IMPRESSION:  Patient continues respond well to manual therapy with a reduction in pain and palpable myofascial trigger points. She has most difficulty with knee ankle exercises using resistance band. She will continue to benefit from progression of ankle  strengthening and weight bearing activities as tolerated.      OBJECTIVE IMPAIRMENTS: decreased activity tolerance, difficulty walking, decreased ROM, decreased strength, improper body mechanics, postural dysfunction, obesity, and pain.   ACTIVITY LIMITATIONS: bending, standing, squatting, sleeping, stairs, transfers, bed mobility, reach over head, locomotion level, and caring for others  PARTICIPATION LIMITATIONS: meal prep, cleaning, laundry, driving, shopping, and community activity  PERSONAL FACTORS: DM, asthma, HTN, CPAP for OSA, R foot surgery and obesity are also affecting patient's functional outcome.   REHAB POTENTIAL: Good  CLINICAL DECISION MAKING: Evolving/moderate complexity  EVALUATION COMPLEXITY: Moderate   GOALS: Goals reviewed with patient? Yes  SHORT TERM GOALS: Target date: 09-29-23 Pt will be independent with initial HEP Baseline:no knowledge Goal status: INITIAL  2.  Pt will be able to get in and out of car with 25 % greater ease than on eval Baseline: 5 x STS 34.72 sec Goal status: INITIAL  3.  Sit and stand with RT=LT wt bearing to reduce lumbar strain and allow for increased tolerance for these positions for home tasks Baseline: Pt  demo RT wt bear and strain of lumbar on eval Goal status: INITIAL    LONG TERM GOALS: Target date: 10-22-23  Pt will be independent with advanced HEP Baseline: no knowledge Goal status: INITIAL  2.  Pt will be able to demonstrate standing to floor transfer independently since she lives along Baseline: unable to perform floor to stand Goal status: INITIAL  3.  Pt will be able to complete 6 MWT for at least 1200 ft to show improved LE strength and locomotion Baseline: 5 times sit to stand: 34.72 sec  2 minute walk test: 296 ft  ( norm 479 ft) Goal status: INITIAL  4.  FOTO will improve from  55%  to  67%   indicating improved functional mobility.  Baseline: 55% Goal status: INITIAL  5.  Pt will be able to  negotiate steps without increasing pain greater than 2/10 Baseline: Pt avoids steps Goal status: INITIAL  6.  Pt will be able to perform 25/25 SL  heel raise without exacerbation of pain greater than 2/10 Baseline: Pt with 10/10 with SL heel raise Goal status: INITIAL   PLAN:  PT FREQUENCY: 1-2x/week  PT DURATION: 8 weeks  PLANNED INTERVENTIONS: Therapeutic exercises, Therapeutic activity, Neuromuscular re-education, Balance training, Gait training, Patient/Family education, Self Care, Joint mobilization, Stair training, Dry Needling, Electrical stimulation, Cryotherapy, Moist heat, Taping, Ionotophoresis 4mg /ml Dexamethasone, Manual therapy, and Re-evaluation  PLAN FOR NEXT SESSION: progress with eccentric heel lowering and gasrocnemius , knee and hip strength.  Education on Achilles tendonitis   Mauri Reading, PT, DPT  09/30/23 5:56 PM Phone: [phone] Fax: [phone]

## 2023-10-02 ENCOUNTER — Ambulatory Visit: Payer: Medicare HMO

## 2023-10-02 DIAGNOSIS — R262 Difficulty in walking, not elsewhere classified: Secondary | ICD-10-CM

## 2023-10-02 DIAGNOSIS — M6281 Muscle weakness (generalized): Secondary | ICD-10-CM

## 2023-10-02 DIAGNOSIS — M25572 Pain in left ankle and joints of left foot: Secondary | ICD-10-CM

## 2023-10-07 ENCOUNTER — Ambulatory Visit: Payer: Medicare HMO

## 2023-10-07 DIAGNOSIS — M25572 Pain in left ankle and joints of left foot: Secondary | ICD-10-CM | POA: Diagnosis not present

## 2023-10-07 DIAGNOSIS — R262 Difficulty in walking, not elsewhere classified: Secondary | ICD-10-CM

## 2023-10-07 DIAGNOSIS — M6281 Muscle weakness (generalized): Secondary | ICD-10-CM

## 2023-10-07 NOTE — Therapy (Signed)
OUTPATIENT PHYSICAL THERAPY NOTE    Patient Name: [name] [name] MRN: [id number] DOB:10/19/1946, 77 y.o., female Today's Date: 10/07/2023  END OF SESSION:  PT End of Session - 10/07/23 1927     Visit Number 7    Number of Visits 12    Date for PT Re-Evaluation 10/22/23    Authorization Type [employer] MCR    PT Start Time 1715    PT Stop Time 1745    PT Time Calculation (min) 30 min    Activity Tolerance Patient tolerated treatment well;Patient limited by fatigue    Behavior During Therapy [facility] for tasks assessed/performed                 Past Medical History:  Diagnosis Date   Anemia    Arthritis    Asthma    COPD (chronic obstructive pulmonary disease) (HCC)    Diabetes mellitus without complication (HCC)    Headache    Hypertension    OSA (obstructive sleep apnea) 01/30/2018   Pneumonia    Sleep apnea    UTI (urinary tract infection) 2018   Past Surgical History:  Procedure Laterality Date   CESAREAN SECTION     COLONOSCOPY N/A 07/04/2017   Procedure: COLONOSCOPY;  Surgeon: Beverley Fiedler, MD;  Location: [facility];  Service: Gastroenterology;  Laterality: N/A;   ESOPHAGOGASTRODUODENOSCOPY N/A 07/04/2017   Procedure: ESOPHAGOGASTRODUODENOSCOPY (EGD);  Surgeon: Beverley Fiedler, MD;  Location: [facility];  Service: Gastroenterology;  Laterality: N/A;   FOOT SURGERY     Patient Active Problem List   Diagnosis Date Noted   Mixed hyperlipidemia 06/16/2021   Coronary artery disease 05/08/2021   Lumbar spondylosis 11/23/2019   History of trichomonal vaginitis 09/07/2018   Post-menopausal bleeding 08/26/2018   Bilateral sensorineural hearing loss 06/07/2018   Primary osteoarthritis of left knee 02/09/2018   OSA (obstructive sleep apnea) 01/30/2018   Sleep disturbance 12/10/2017   Mild intermittent asthma without complication 09/11/2017   B12 deficiency 09/10/2017   Uterine adenomyoma 07/04/2017   Symptomatic anemia 07/04/2017   Bacteremia, escherichia coli  07/04/2017   Angiodysplasia of duodenum    Iron deficiency anemia due to chronic blood loss    Rectal bleeding    Sepsis (HCC) 06/29/2017   Acute lower UTI 06/29/2017   Type 2 diabetes mellitus with obesity (HCC) 06/29/2017   Acute cystitis without hematuria    Allergic rhinitis 02/18/2007   Asthma 02/18/2007   BACK PAIN, LOW 02/18/2007   LEG PAIN OR KNEE PAIN 02/18/2007   Former smoker 02/18/2007    PCP: Verlon Au, MD   REFERRING PROVIDER: Edwin Cap, DPM   REFERRING DIAG: [phone] (ICD-10-CM) - Achilles tendinitis of left lower extremity   THERAPY DIAG:  Pain in left ankle and joints of left foot  Muscle weakness (generalized)  Difficulty in walking, not elsewhere classified  Rationale for Evaluation and Treatment: Rehabilitation  ONSET DATE: May 2024   SUBJECTIVE:   SUBJECTIVE STATEMENT:   Patient reporting recent exacerbation of symptoms this week, that are likely related to increased time walking on Monday and Sciatic nerve flare.   PERTINENT HISTORY: DM, asthma, HTN, CPAP for OSA, R foot surgery PAIN:  Are you having pain? Yes: NPRS scale: 0/10 at rest and 10/10 with flare/10 Pain location: mid tendon Achilles on Left Pain description: deep ache and sometimes sharp Aggravating factors: walking too longer than, gettting up and down from seat getting in and out of car. Going up and down steps, bathing and  putting on socks and doing household chores.  Relieving factors: not walking , resting  PRECAUTIONS: None  RED FLAGS: Bowel or bladder incontinence: No   WEIGHT BEARING RESTRICTIONS: No  FALLS:  Has patient fallen in last 6 months? No  LIVING ENVIRONMENT: Lives with: lives alone Lives in: House/apartment Stairs: No Has following equipment at home: None  OCCUPATION: retired  PLOF: Independent  PATIENT GOALS: Stop pain so I can get back to walking. I walk 30-45 minutes for exercise but when I have pain I cannot walk.  NEXT MD  VISIT: TBD  OBJECTIVE:   DIAGNOSTIC FINDINGS: Multiple views x-ray of the left foot: no fracture, dislocation, swelling or degenerative changes noted and calcification of distal Achilles insertion   PATIENT SURVEYS:  FOTO 55%  predicted 67% 10/02/23 77%  COGNITION: Overall cognitive status: Within functional limits for tasks assessed     SENSATION: WFL   MUSCLE LENGTH: Hamstrings: Right 69 deg; Left 69 deg   POSTURE: rounded shoulders, forward head, and flexed trunk   PALPATION: TTP in mid achilles tendon on Left  LOWER EXTREMITY ROM:  Active ROM Right eval Left eval  Hip flexion    Hip extension    Hip abduction    Hip adduction    Hip internal rotation    Hip external rotation    Knee flexion 122/PROM 130 122/ PROM 130  Knee extension    Ankle dorsiflexion 10 +2  Ankle plantarflexion 48 45  Ankle inversion 42 32  Ankle eversion 27 20   (Blank rows = not tested)  LOWER EXTREMITY MMT:  MMT Right eval Left eval  Hip flexion 4 4+  Hip extension 4 4  Hip abduction 4- 4-  Hip adduction    Hip internal rotation    Hip external rotation    Knee flexion 4 4  Knee extension 4 4  Ankle dorsiflexion    Ankle plantarflexion 20/25 12/25  Ankle inversion    Ankle eversion     (Blank rows = not tested)    FUNCTIONAL TESTS:  5 times sit to stand: 34.72 sec  2 minute walk test: 296 ft  ( norm 479 ft)  GAIT: Distance walked: see above 2 MWT  Assistive device utilized: None Level of assistance: Complete Independence Comments: Pt with slow cadence,  Pt transfer from seat with Rt wt bearing and states she feels strain on R low back with compensatory movements.    TODAY'S TREATMENT:      [facility] Adult PT Treatment:                                                DATE: 10/07/2023  Therapeutic Exercise: Recumbent bike level 2 x 5 min  Standing heel raises BIL 2 x 10 at counter  Standing toe raises BIL 2 x 10 at counter   Manual Therapy: STM/IASTYM L gastroc  and achilles insertion 8 min   [facility] Adult PT Treatment:                                                DATE: 10/02/23 Therapeutic Exercise: Nustep L2 x 6 min Seated rockerboard AP, 3 x 1 min(2s hold in DF) Seated calf stretch with strap on  4 in riser 30s x3 Ankle 4 way - GTB - x15 Manual Therapy: STM/CFM L gastroc and achilles insertion 8 min                                                                                                                             [facility] Adult PT Treatment:                                                DATE: 09/25/2023  Therapeutic Exercise: Nustep x 6 minutes at end of session Seated rockerboard AP, 3 x 1 min  Seated calf stretch  Ankle 4 way - GTB - 2x15  Manual Therapy: STM and IASTM Lt gastroc/soleus, achilles tendon Passive achilles stretch   [facility] Adult PT Treatment:                                                DATE: 09/18/2023  Therapeutic Exercise: Nustep x 5 minutes  Seated rockerboard AP, 2 x 1 min  Seated calf stretch  Patient education regarding HEP and use of modalities  Manual Therapy: STM Lt gastroc/soleus, achilles tendon Passive achilles stretch    PATIENT EDUCATION:  Education details: POC Explanation of findings Person educated: Patient Education method: Explanation, Demonstration, Tactile cues, Verbal cues, and Handouts Education comprehension: verbalized understanding, returned demonstration, verbal cues required, tactile cues required, and needs further education  HOME EXERCISE PROGRAM:  Access Code: [email or web address] URL: [email or web address] Date: 09/08/2023 Prepared by: Garen Lah  Exercises - Seated Plantar Fascia Mobilization with Small Ball  - 1 x daily - 7 x weekly - 2 minute hold - Calf Mobilization with Small Ball  - 1 x daily - 7 x weekly - 2 minute hold - Calf Mobilization with Foam Roll  - 1 x daily - 7 x weekly - 2 minute hold - Seated Plantar Fascia Stretch  - 1 x daily - 7 x  weekly - 2 sets - 30 hold  ASSESSMENT:  CLINICAL IMPRESSION:    Patient is somewhat limited today by increased sciatic pain, and general fatigue.  However she was able to perform standing heel and toe raises without exacerbation of ankle/Achilles pain.  She continues to respond well to manual therapy including use of cross friction massage at last visit, followed by IASTM at end of today's session.  She will continue to benefit from progression of concentric strengthening, eccentric strengthening and stretching activities to address remaining deficits.    OBJECTIVE IMPAIRMENTS: decreased activity tolerance, difficulty walking, decreased ROM, decreased strength, improper body mechanics, postural dysfunction, obesity, and pain.   ACTIVITY LIMITATIONS: bending, standing, squatting, sleeping, stairs, transfers, bed mobility, reach over head, locomotion level, and caring for  others  PARTICIPATION LIMITATIONS: meal prep, cleaning, laundry, driving, shopping, and community activity  PERSONAL FACTORS: DM, asthma, HTN, CPAP for OSA, R foot surgery and obesity are also affecting patient's functional outcome.   REHAB POTENTIAL: Good  CLINICAL DECISION MAKING: Evolving/moderate complexity  EVALUATION COMPLEXITY: Moderate   GOALS: Goals reviewed with patient? Yes  SHORT TERM GOALS: Target date: 09-29-23 Pt will be independent with initial HEP Baseline:no knowledge Goal status: INITIAL  2.  Pt will be able to get in and out of car with 25 % greater ease than on eval Baseline: 5 x STS 34.72 sec; 10/02/23 Still problematic but not from ankle region Goal status: INITIAL  3.  Sit and stand with RT=LT wt bearing to reduce lumbar strain and allow for increased tolerance for these positions for home tasks Baseline: Pt demo RT wt bear and strain of lumbar on eval Goal status: INITIAL    LONG TERM GOALS: Target date: 10-22-23  Pt will be independent with advanced HEP Baseline: no  knowledge Goal status: INITIAL  2.  Pt will be able to demonstrate standing to floor transfer independently since she lives along Baseline: unable to perform floor to stand Goal status: INITIAL  3.  Pt will be able to complete 6 MWT for at least 1200 ft to show improved LE strength and locomotion Baseline: 5 times sit to stand: 34.72 sec  2 minute walk test: 296 ft  ( norm 479 ft) Goal status: INITIAL  4.  FOTO will improve from  55%  to  67%   indicating improved functional mobility.  Baseline: 55%; 10/02/23 77% Goal status: Met  5.  Pt will be able to negotiate steps without increasing pain greater than 2/10 Baseline: Pt avoids steps Goal status: INITIAL  6.  Pt will be able to perform 25/25 SL  heel raise without exacerbation of pain greater than 2/10 Baseline: Pt with 10/10 with SL heel raise Goal status: INITIAL   PLAN:  PT FREQUENCY: 1-2x/week  PT DURATION: 8 weeks  PLANNED INTERVENTIONS: Therapeutic exercises, Therapeutic activity, Neuromuscular re-education, Balance training, Gait training, Patient/Family education, Self Care, Joint mobilization, Stair training, Dry Needling, Electrical stimulation, Cryotherapy, Moist heat, Taping, Ionotophoresis 4mg /ml Dexamethasone, Manual therapy, and Re-evaluation  PLAN FOR NEXT SESSION: progress with eccentric heel lowering and gasrocnemius , knee and hip strength.  Education on Achilles tendonitis   Mauri Reading, PT, DPT   10/07/23 7:30 PM Phone: [phone] Fax: [phone]

## 2023-10-09 ENCOUNTER — Ambulatory Visit: Payer: Medicare HMO

## 2023-10-09 DIAGNOSIS — R262 Difficulty in walking, not elsewhere classified: Secondary | ICD-10-CM

## 2023-10-09 DIAGNOSIS — M25572 Pain in left ankle and joints of left foot: Secondary | ICD-10-CM | POA: Diagnosis not present

## 2023-10-09 DIAGNOSIS — M6281 Muscle weakness (generalized): Secondary | ICD-10-CM

## 2023-10-09 NOTE — Therapy (Signed)
OUTPATIENT PHYSICAL THERAPY NOTE    Patient Name: [name] [name] MRN: [id number] DOB:Apr 29, 1946, 77 y.o., female Today's Date: 10/09/2023  END OF SESSION:  PT End of Session - 10/09/23 1140     Visit Number 8    Number of Visits 12    Date for PT Re-Evaluation 10/22/23    Authorization Type [employer] MCR    PT Start Time 1002    PT Stop Time 1043    PT Time Calculation (min) 41 min    Activity Tolerance Patient tolerated treatment well;Patient limited by fatigue    Behavior During Therapy [facility] for tasks assessed/performed                  Past Medical History:  Diagnosis Date   Anemia    Arthritis    Asthma    COPD (chronic obstructive pulmonary disease) (HCC)    Diabetes mellitus without complication (HCC)    Headache    Hypertension    OSA (obstructive sleep apnea) 01/30/2018   Pneumonia    Sleep apnea    UTI (urinary tract infection) 2018   Past Surgical History:  Procedure Laterality Date   CESAREAN SECTION     COLONOSCOPY N/A 07/04/2017   Procedure: COLONOSCOPY;  Surgeon: Beverley Fiedler, MD;  Location: [facility];  Service: Gastroenterology;  Laterality: N/A;   ESOPHAGOGASTRODUODENOSCOPY N/A 07/04/2017   Procedure: ESOPHAGOGASTRODUODENOSCOPY (EGD);  Surgeon: Beverley Fiedler, MD;  Location: [facility];  Service: Gastroenterology;  Laterality: N/A;   FOOT SURGERY     Patient Active Problem List   Diagnosis Date Noted   Mixed hyperlipidemia 06/16/2021   Coronary artery disease 05/08/2021   Lumbar spondylosis 11/23/2019   History of trichomonal vaginitis 09/07/2018   Post-menopausal bleeding 08/26/2018   Bilateral sensorineural hearing loss 06/07/2018   Primary osteoarthritis of left knee 02/09/2018   OSA (obstructive sleep apnea) 01/30/2018   Sleep disturbance 12/10/2017   Mild intermittent asthma without complication 09/11/2017   B12 deficiency 09/10/2017   Uterine adenomyoma 07/04/2017   Symptomatic anemia 07/04/2017   Bacteremia, escherichia coli  07/04/2017   Angiodysplasia of duodenum    Iron deficiency anemia due to chronic blood loss    Rectal bleeding    Sepsis (HCC) 06/29/2017   Acute lower UTI 06/29/2017   Type 2 diabetes mellitus with obesity (HCC) 06/29/2017   Acute cystitis without hematuria    Allergic rhinitis 02/18/2007   Asthma 02/18/2007   BACK PAIN, LOW 02/18/2007   LEG PAIN OR KNEE PAIN 02/18/2007   Former smoker 02/18/2007    PCP: Verlon Au, MD   REFERRING PROVIDER: Edwin Cap, DPM   REFERRING DIAG: [id number] (ICD-10-CM) - Achilles tendinitis of left lower extremity   THERAPY DIAG:  Pain in left ankle and joints of left foot  Muscle weakness (generalized)  Difficulty in walking, not elsewhere classified  Rationale for Evaluation and Treatment: Rehabilitation  ONSET DATE: May 2024   SUBJECTIVE:   SUBJECTIVE STATEMENT:   Patient reporting that she is feeling better this morning. She 0/10    PERTINENT HISTORY: DM, asthma, HTN, CPAP for OSA, R foot surgery PAIN:  Are you having pain? Yes: NPRS scale: 0/10 at rest and 10/10 with flare/10 Pain location: mid tendon Achilles on Left Pain description: deep ache and sometimes sharp Aggravating factors: walking too longer than, gettting up and down from seat getting in and out of car. Going up and down steps, bathing and putting on socks and doing household chores.  Relieving  factors: not walking , resting  PRECAUTIONS: None  RED FLAGS: Bowel or bladder incontinence: No   WEIGHT BEARING RESTRICTIONS: No  FALLS:  Has patient fallen in last 6 months? No  LIVING ENVIRONMENT: Lives with: lives alone Lives in: House/apartment Stairs: No Has following equipment at home: None  OCCUPATION: retired  PLOF: Independent  PATIENT GOALS: Stop pain so I can get back to walking. I walk 30-45 minutes for exercise but when I have pain I cannot walk.  NEXT MD VISIT: TBD  OBJECTIVE:   DIAGNOSTIC FINDINGS: Multiple views x-ray of the  left foot: no fracture, dislocation, swelling or degenerative changes noted and calcification of distal Achilles insertion   PATIENT SURVEYS:  FOTO 55%  predicted 67% 10/02/23 77%  COGNITION: Overall cognitive status: Within functional limits for tasks assessed     SENSATION: WFL   MUSCLE LENGTH: Hamstrings: Right 69 deg; Left 69 deg   POSTURE: rounded shoulders, forward head, and flexed trunk   PALPATION: TTP in mid achilles tendon on Left  LOWER EXTREMITY ROM:  Active ROM Right eval Left eval  Hip flexion    Hip extension    Hip abduction    Hip adduction    Hip internal rotation    Hip external rotation    Knee flexion 122/PROM 130 122/ PROM 130  Knee extension    Ankle dorsiflexion 10 +2  Ankle plantarflexion 48 45  Ankle inversion 42 32  Ankle eversion 27 20   (Blank rows = not tested)  LOWER EXTREMITY MMT:  MMT Right eval Left eval  Hip flexion 4 4+  Hip extension 4 4  Hip abduction 4- 4-  Hip adduction    Hip internal rotation    Hip external rotation    Knee flexion 4 4  Knee extension 4 4  Ankle dorsiflexion    Ankle plantarflexion 20/25 12/25  Ankle inversion    Ankle eversion     (Blank rows = not tested)    FUNCTIONAL TESTS:  5 times sit to stand: 34.72 sec  2 minute walk test: 296 ft  ( norm 479 ft)  GAIT: Distance walked: see above 2 MWT  Assistive device utilized: None Level of assistance: Complete Independence Comments: Pt with slow cadence,  Pt transfer from seat with Rt wt bearing and states she feels strain on R low back with compensatory movements.    TODAY'S TREATMENT:      [facility] Adult PT Treatment:                                                DATE: 10/09/2023  Therapeutic Exercise: Recumbent bike level 1-3 x 5 min total  Standing heel raises BIL 2 x 10 at counter  Standing toe raises BIL 2 x 10 at counter  Ankle 4-way with blue TB x 15 each way  Seated calf stretch 2 x 30 sec TM walking incline 3-7% to address  stride length  Followed by cueing for improved stride length   Manual Therapy: STM/IASTYM L gastroc and achilles insertion 8 min   [facility] Adult PT Treatment:  DATE: 10/07/2023  Therapeutic Exercise: Recumbent bike level 2 x 5 min  Standing heel raises BIL 2 x 10 at counter  Standing toe raises BIL 2 x 10 at counter   Manual Therapy: STM/IASTYM L gastroc and achilles insertion 8 min   [facility] Adult PT Treatment:                                                DATE: 10/02/23 Therapeutic Exercise: Nustep L2 x 6 min Seated rockerboard AP, 3 x 1 min(2s hold in DF) Seated calf stretch with strap on 4 in riser 30s x3 Ankle 4 way - GTB - x15 Manual Therapy: STM/CFM L gastroc and achilles insertion 8 min                                                                                                                             [facility] Adult PT Treatment:                                                DATE: 09/25/2023  Therapeutic Exercise: Nustep x 6 minutes at end of session Seated rockerboard AP, 3 x 1 min  Seated calf stretch  Ankle 4 way - GTB - 2x15  Manual Therapy: STM and IASTM Lt gastroc/soleus, achilles tendon Passive achilles stretch   [facility] Adult PT Treatment:                                                DATE: 09/18/2023  Therapeutic Exercise: Nustep x 5 minutes  Seated rockerboard AP, 2 x 1 min  Seated calf stretch  Patient education regarding HEP and use of modalities  Manual Therapy: STM Lt gastroc/soleus, achilles tendon Passive achilles stretch    PATIENT EDUCATION:  Education details: POC Explanation of findings Person educated: Patient Education method: Explanation, Demonstration, Tactile cues, Verbal cues, and Handouts Education comprehension: verbalized understanding, returned demonstration, verbal cues required, tactile cues required, and needs further education  HOME EXERCISE PROGRAM:  Access Code:  [email or web address] URL: [email or web address] Date: 09/08/2023 Prepared by: Garen Lah  Exercises - Seated Plantar Fascia Mobilization with Small Ball  - 1 x daily - 7 x weekly - 2 minute hold - Calf Mobilization with Small Ball  - 1 x daily - 7 x weekly - 2 minute hold - Calf Mobilization with Foam Roll  - 1 x daily - 7 x weekly - 2 minute hold - Seated Plantar Fascia Stretch  - 1 x daily - 7 x weekly - 2 sets - 30 hold  ASSESSMENT:  CLINICAL IMPRESSION:    Patient had significantly improved tolerance of exercises as compared with last treatment session. She was able to resume resisted ankle strengthening activities and work on incline walking. Following cueing today, patient is demonstrating improved gait mechanics, including increased stride length. She will benefit from focus on active interventions at remaining sessions.     OBJECTIVE IMPAIRMENTS: decreased activity tolerance, difficulty walking, decreased ROM, decreased strength, improper body mechanics, postural dysfunction, obesity, and pain.   ACTIVITY LIMITATIONS: bending, standing, squatting, sleeping, stairs, transfers, bed mobility, reach over head, locomotion level, and caring for others  PARTICIPATION LIMITATIONS: meal prep, cleaning, laundry, driving, shopping, and community activity  PERSONAL FACTORS: DM, asthma, HTN, CPAP for OSA, R foot surgery and obesity are also affecting patient's functional outcome.   REHAB POTENTIAL: Good  CLINICAL DECISION MAKING: Evolving/moderate complexity  EVALUATION COMPLEXITY: Moderate   GOALS: Goals reviewed with patient? Yes  SHORT TERM GOALS: Target date: 09-29-23 Pt will be independent with initial HEP Baseline:no knowledge Goal status: INITIAL  2.  Pt will be able to get in and out of car with 25 % greater ease than on eval Baseline: 5 x STS 34.72 sec; 10/02/23 Still problematic but not from ankle region Goal status: INITIAL  3.  Sit and stand with RT=LT wt  bearing to reduce lumbar strain and allow for increased tolerance for these positions for home tasks Baseline: Pt demo RT wt bear and strain of lumbar on eval Goal status: INITIAL    LONG TERM GOALS: Target date: 10-22-23  Pt will be independent with advanced HEP Baseline: no knowledge Goal status: INITIAL  2.  Pt will be able to demonstrate standing to floor transfer independently since she lives along Baseline: unable to perform floor to stand Goal status: INITIAL  3.  Pt will be able to complete 6 MWT for at least 1200 ft to show improved LE strength and locomotion Baseline: 5 times sit to stand: 34.72 sec  2 minute walk test: 296 ft  ( norm 479 ft) Goal status: INITIAL  4.  FOTO will improve from  55%  to  67%   indicating improved functional mobility.  Baseline: 55%; 10/02/23 77% Goal status: Met  5.  Pt will be able to negotiate steps without increasing pain greater than 2/10 Baseline: Pt avoids steps Goal status: INITIAL  6.  Pt will be able to perform 25/25 SL  heel raise without exacerbation of pain greater than 2/10 Baseline: Pt with 10/10 with SL heel raise Goal status: INITIAL   PLAN:  PT FREQUENCY: 1-2x/week  PT DURATION: 8 weeks  PLANNED INTERVENTIONS: Therapeutic exercises, Therapeutic activity, Neuromuscular re-education, Balance training, Gait training, Patient/Family education, Self Care, Joint mobilization, Stair training, Dry Needling, Electrical stimulation, Cryotherapy, Moist heat, Taping, Ionotophoresis 4mg /ml Dexamethasone, Manual therapy, and Re-evaluation  PLAN FOR NEXT SESSION: progress with eccentric heel lowering and gasrocnemius , knee and hip strength.  Education on Achilles tendonitis   Mauri Reading, PT, DPT   10/09/23 11:51 AM Phone: [phone] Fax: [phone]

## 2023-10-14 ENCOUNTER — Ambulatory Visit: Payer: Medicare HMO

## 2023-10-14 DIAGNOSIS — M25572 Pain in left ankle and joints of left foot: Secondary | ICD-10-CM | POA: Diagnosis not present

## 2023-10-14 DIAGNOSIS — M6281 Muscle weakness (generalized): Secondary | ICD-10-CM

## 2023-10-14 DIAGNOSIS — R262 Difficulty in walking, not elsewhere classified: Secondary | ICD-10-CM

## 2023-10-14 NOTE — Therapy (Signed)
OUTPATIENT PHYSICAL THERAPY NOTE    Patient Name: [name] [name] MRN: [id number] DOB:1946/06/19, 77 y.o., female Today's Date: 10/14/2023  END OF SESSION:  PT End of Session - 10/14/23 1750     Visit Number 9    Number of Visits 12    Date for PT Re-Evaluation 10/22/23    Authorization Type [employer] MCR    PT Start Time 1700    PT Stop Time 1743    PT Time Calculation (min) 43 min                   Past Medical History:  Diagnosis Date   Anemia    Arthritis    Asthma    COPD (chronic obstructive pulmonary disease) (HCC)    Diabetes mellitus without complication (HCC)    Headache    Hypertension    OSA (obstructive sleep apnea) 01/30/2018   Pneumonia    Sleep apnea    UTI (urinary tract infection) 2018   Past Surgical History:  Procedure Laterality Date   CESAREAN SECTION     COLONOSCOPY N/A 07/04/2017   Procedure: COLONOSCOPY;  Surgeon: Beverley Fiedler, MD;  Location: [facility];  Service: Gastroenterology;  Laterality: N/A;   ESOPHAGOGASTRODUODENOSCOPY N/A 07/04/2017   Procedure: ESOPHAGOGASTRODUODENOSCOPY (EGD);  Surgeon: Beverley Fiedler, MD;  Location: [facility];  Service: Gastroenterology;  Laterality: N/A;   FOOT SURGERY     Patient Active Problem List   Diagnosis Date Noted   Mixed hyperlipidemia 06/16/2021   Coronary artery disease 05/08/2021   Lumbar spondylosis 11/23/2019   History of trichomonal vaginitis 09/07/2018   Post-menopausal bleeding 08/26/2018   Bilateral sensorineural hearing loss 06/07/2018   Primary osteoarthritis of left knee 02/09/2018   OSA (obstructive sleep apnea) 01/30/2018   Sleep disturbance 12/10/2017   Mild intermittent asthma without complication 09/11/2017   B12 deficiency 09/10/2017   Uterine adenomyoma 07/04/2017   Symptomatic anemia 07/04/2017   Bacteremia, escherichia coli 07/04/2017   Angiodysplasia of duodenum    Iron deficiency anemia due to chronic blood loss    Rectal bleeding    Sepsis (HCC) 06/29/2017    Acute lower UTI 06/29/2017   Type 2 diabetes mellitus with obesity (HCC) 06/29/2017   Acute cystitis without hematuria    Allergic rhinitis 02/18/2007   Asthma 02/18/2007   BACK PAIN, LOW 02/18/2007   LEG PAIN OR KNEE PAIN 02/18/2007   Former smoker 02/18/2007    PCP: Verlon Au, MD   REFERRING PROVIDER: Edwin Cap, DPM   REFERRING DIAG: [phone] (ICD-10-CM) - Achilles tendinitis of left lower extremity   THERAPY DIAG:  Pain in left ankle and joints of left foot  Muscle weakness (generalized)  Difficulty in walking, not elsewhere classified  Rationale for Evaluation and Treatment: Rehabilitation  ONSET DATE: May 2024   SUBJECTIVE:   SUBJECTIVE STATEMENT:   Patton reports that she has been having little to no pain lately, with exception of yesterday.    PERTINENT HISTORY: DM, asthma, HTN, CPAP for OSA, R foot surgery PAIN:  Are you having pain? Yes: NPRS scale: 0/10 at rest and 10/10 with flare/10 Pain location: mid tendon Achilles on Left Pain description: deep ache and sometimes sharp Aggravating factors: walking too longer than, gettting up and down from seat getting in and out of car. Going up and down steps, bathing and putting on socks and doing household chores.  Relieving factors: not walking , resting  PRECAUTIONS: None  RED FLAGS: Bowel or bladder incontinence: No  WEIGHT BEARING RESTRICTIONS: No  FALLS:  Has patient fallen in last 6 months? No  LIVING ENVIRONMENT: Lives with: lives alone Lives in: House/apartment Stairs: No Has following equipment at home: None  OCCUPATION: retired  PLOF: Independent  PATIENT GOALS: Stop pain so I can get back to walking. I walk 30-45 minutes for exercise but when I have pain I cannot walk.  NEXT MD VISIT: TBD  OBJECTIVE:   DIAGNOSTIC FINDINGS: Multiple views x-ray of the left foot: no fracture, dislocation, swelling or degenerative changes noted and calcification of distal Achilles insertion    PATIENT SURVEYS:  FOTO 55%  predicted 67% 10/02/23 77%  COGNITION: Overall cognitive status: Within functional limits for tasks assessed     SENSATION: WFL   MUSCLE LENGTH: Hamstrings: Right 69 deg; Left 69 deg   POSTURE: rounded shoulders, forward head, and flexed trunk   PALPATION: TTP in mid achilles tendon on Left  LOWER EXTREMITY ROM:  Active ROM Right eval Left eval  Hip flexion    Hip extension    Hip abduction    Hip adduction    Hip internal rotation    Hip external rotation    Knee flexion 122/PROM 130 122/ PROM 130  Knee extension    Ankle dorsiflexion 10 +2  Ankle plantarflexion 48 45  Ankle inversion 42 32  Ankle eversion 27 20   (Blank rows = not tested)  LOWER EXTREMITY MMT:  MMT Right eval Left eval  Hip flexion 4 4+  Hip extension 4 4  Hip abduction 4- 4-  Hip adduction    Hip internal rotation    Hip external rotation    Knee flexion 4 4  Knee extension 4 4  Ankle dorsiflexion    Ankle plantarflexion 20/25 12/25  Ankle inversion    Ankle eversion     (Blank rows = not tested)    FUNCTIONAL TESTS:  5 times sit to stand: 34.72 sec  2 minute walk test: 296 ft  ( norm 479 ft)  GAIT: Distance walked: see above 2 MWT  Assistive device utilized: None Level of assistance: Complete Independence Comments: Pt with slow cadence,  Pt transfer from seat with Rt wt bearing and states she feels strain on R low back with compensatory movements.    TODAY'S TREATMENT:      [facility] Adult PT Treatment:                                                DATE: 10/14/2023  Therapeutic Exercise: Recumbent bike level 3 x 5 min total  Standing heel raises BIL 2 x 10 at airex Standing toe raises BIL 2 x 10 on airex   Ankle 4-way with blue TB x 15 each way  Seated calf stretch 2 x 30 sec  Therapeutic Activity:  TM walking incline 3-7% to address stride length  Followed by cueing for improved stride length  Obstacle Course  6" step, airex,  hurdles (4) x 2 laps fwd, x 2 laps lat Lateral walking on airex beam x 3 laps  Lateral walking x 2 laps with heels off  Lateral walking x 2 laps with toes off  HEP update and review   [facility] Adult PT Treatment:  DATE: 10/09/2023  Therapeutic Exercise: Recumbent bike level 1-3 x 5 min total  Standing heel raises BIL 2 x 10 at counter  Standing toe raises BIL 2 x 10 at counter  Ankle 4-way with blue TB x 15 each way  Seated calf stretch 2 x 30 sec TM walking incline 3-7% to address stride length  Followed by cueing for improved stride length   Manual Therapy: STM/IASTYM L gastroc and achilles insertion 8 min   [facility] Adult PT Treatment:                                                DATE: 10/07/2023  Therapeutic Exercise: Recumbent bike level 2 x 5 min  Standing heel raises BIL 2 x 10 at counter  Standing toe raises BIL 2 x 10 at counter   Manual Therapy: STM/IASTYM L gastroc and achilles insertion 8 min     PATIENT EDUCATION:  Education details: POC Explanation of findings Person educated: Patient Education method: Explanation, Demonstration, Tactile cues, Verbal cues, and Handouts Education comprehension: verbalized understanding, returned demonstration, verbal cues required, tactile cues required, and needs further education  HOME EXERCISE PROGRAM:  Access Code: [email or web address] URL: [email or web address] Date: 10/14/2023 Prepared by: Mauri Reading  Exercises - Gastroc Stretch on Wall  - 1 x daily - 7 x weekly - 3 sets - 30 sec hold - Seated Plantar Fascia Mobilization with Small Ball  - 1 x daily - 7 x weekly - 2 minute hold - Seated Plantar Fascia Stretch  - 1 x daily - 7 x weekly - 2 sets - 30 hold - Seated Ankle Plantarflexion with Resistance  - 1 x daily - 3 x weekly - 2 sets - 10 reps - 3 sec hold - Seated Ankle Dorsiflexion with Resistance  - 1 x daily - 3 x weekly - 2 sets - 10 reps - 3 sec hold - Seated  Ankle Inversion with Resistance  - 1 x daily - 7 x weekly - 2 sets - 10 reps - 3 sec hold - Seated Ankle Eversion with Resistance  - 1 x daily - 7 x weekly - 2 sets - 10 reps - 3 sec hold  ASSESSMENT:  CLINICAL IMPRESSION:    Lupita is demonstrating great improvement with activity tolerance, including standing and walking activities on both even and compliant surfaces. Updated and reviewed home exercises program including tips for correct muscle activation with resisted ankle strengthening exercises. Plan is to discharge at next visit.      OBJECTIVE IMPAIRMENTS: decreased activity tolerance, difficulty walking, decreased ROM, decreased strength, improper body mechanics, postural dysfunction, obesity, and pain.   ACTIVITY LIMITATIONS: bending, standing, squatting, sleeping, stairs, transfers, bed mobility, reach over head, locomotion level, and caring for others  PARTICIPATION LIMITATIONS: meal prep, cleaning, laundry, driving, shopping, and community activity  PERSONAL FACTORS: DM, asthma, HTN, CPAP for OSA, R foot surgery and obesity are also affecting patient's functional outcome.   REHAB POTENTIAL: Good  CLINICAL DECISION MAKING: Evolving/moderate complexity  EVALUATION COMPLEXITY: Moderate   GOALS: Goals reviewed with patient? Yes  SHORT TERM GOALS: Target date: 09-29-23 Pt will be independent with initial HEP Baseline:no knowledge Goal status: INITIAL  2.  Pt will be able to get in and out of car with 25 % greater ease than on eval Baseline: 5 x STS 34.72 sec; 10/02/23 Still problematic  but not from ankle region Goal status: INITIAL  3.  Sit and stand with RT=LT wt bearing to reduce lumbar strain and allow for increased tolerance for these positions for home tasks Baseline: Pt demo RT wt bear and strain of lumbar on eval Goal status: INITIAL    LONG TERM GOALS: Target date: 10-22-23  Pt will be independent with advanced HEP Baseline: no knowledge Goal status:  INITIAL  2.  Pt will be able to demonstrate standing to floor transfer independently since she lives along Baseline: unable to perform floor to stand Goal status: INITIAL  3.  Pt will be able to complete 6 MWT for at least 1200 ft to show improved LE strength and locomotion Baseline: 5 times sit to stand: 34.72 sec  2 minute walk test: 296 ft  ( norm 479 ft) Goal status: INITIAL  4.  FOTO will improve from  55%  to  67%   indicating improved functional mobility.  Baseline: 55%; 10/02/23 77% Goal status: Met  5.  Pt will be able to negotiate steps without increasing pain greater than 2/10 Baseline: Pt avoids steps Goal status: INITIAL  6.  Pt will be able to perform 25/25 SL  heel raise without exacerbation of pain greater than 2/10 Baseline: Pt with 10/10 with SL heel raise Goal status: INITIAL   PLAN:  PT FREQUENCY: 1-2x/week  PT DURATION: 8 weeks  PLANNED INTERVENTIONS: Therapeutic exercises, Therapeutic activity, Neuromuscular re-education, Balance training, Gait training, Patient/Family education, Self Care, Joint mobilization, Stair training, Dry Needling, Electrical stimulation, Cryotherapy, Moist heat, Taping, Ionotophoresis 4mg /ml Dexamethasone, Manual therapy, and Re-evaluation  PLAN FOR NEXT SESSION: progress with eccentric heel lowering and gasrocnemius , knee and hip strength.  Education on Achilles tendonitis   Mauri Reading, PT, DPT   10/14/23 6:29 PM Phone: [phone] Fax: [phone]

## 2023-10-16 ENCOUNTER — Ambulatory Visit: Payer: Medicare HMO | Admitting: Physical Therapy

## 2023-10-16 ENCOUNTER — Encounter: Payer: Self-pay | Admitting: Physical Therapy

## 2023-10-16 DIAGNOSIS — R262 Difficulty in walking, not elsewhere classified: Secondary | ICD-10-CM

## 2023-10-16 DIAGNOSIS — M25572 Pain in left ankle and joints of left foot: Secondary | ICD-10-CM

## 2023-10-16 DIAGNOSIS — M6281 Muscle weakness (generalized): Secondary | ICD-10-CM

## 2023-10-16 NOTE — Therapy (Addendum)
PHYSICAL THERAPY DISCHARGE SUMMARY  Visits from Start of Care: 10  Current functional level related to goals / functional outcomes: See assessment/goals   Remaining deficits: See assessment/goals   Education / Equipment: HEP and D/C plans  Patient agrees to discharge. Patient goals were  met or partially met . Patient is being discharged due to being pleased with the current functional level.    Patient Name: [name] [name] MRN: [id number] DOB:1946-12-17, 77 y.o., female Today's Date: 10/16/2023  END OF SESSION:  PT End of Session - 10/16/23 1016     Visit Number 10    Number of Visits 12    Date for PT Re-Evaluation 10/22/23    Authorization Type [employer] MCR    PT Start Time 1015   pt arrived late   PT Stop Time 1038    PT Time Calculation (min) 23 min    Activity Tolerance Patient tolerated treatment well    Behavior During Therapy WFL for tasks assessed/performed                   Past Medical History:  Diagnosis Date   Anemia    Arthritis    Asthma    COPD (chronic obstructive pulmonary disease) (HCC)    Diabetes mellitus without complication (HCC)    Headache    Hypertension    OSA (obstructive sleep apnea) 01/30/2018   Pneumonia    Sleep apnea    UTI (urinary tract infection) 2018   Past Surgical History:  Procedure Laterality Date   CESAREAN SECTION     COLONOSCOPY N/A 07/04/2017   Procedure: COLONOSCOPY;  Surgeon: Beverley Fiedler, MD;  Location: [facility];  Service: Gastroenterology;  Laterality: N/A;   ESOPHAGOGASTRODUODENOSCOPY N/A 07/04/2017   Procedure: ESOPHAGOGASTRODUODENOSCOPY (EGD);  Surgeon: Beverley Fiedler, MD;  Location: [facility];  Service: Gastroenterology;  Laterality: N/A;   FOOT SURGERY     Patient Active Problem List   Diagnosis Date Noted   Mixed hyperlipidemia 06/16/2021   Coronary artery disease 05/08/2021   Lumbar spondylosis 11/23/2019   History of trichomonal vaginitis 09/07/2018   Post-menopausal bleeding  08/26/2018   Bilateral sensorineural hearing loss 06/07/2018   Primary osteoarthritis of left knee 02/09/2018   OSA (obstructive sleep apnea) 01/30/2018   Sleep disturbance 12/10/2017   Mild intermittent asthma without complication 09/11/2017   B12 deficiency 09/10/2017   Uterine adenomyoma 07/04/2017   Symptomatic anemia 07/04/2017   Bacteremia, escherichia coli 07/04/2017   Angiodysplasia of duodenum    Iron deficiency anemia due to chronic blood loss    Rectal bleeding    Sepsis (HCC) 06/29/2017   Acute lower UTI 06/29/2017   Type 2 diabetes mellitus with obesity (HCC) 06/29/2017   Acute cystitis without hematuria    Allergic rhinitis 02/18/2007   Asthma 02/18/2007   BACK PAIN, LOW 02/18/2007   LEG PAIN OR KNEE PAIN 02/18/2007   Former smoker 02/18/2007    PCP: Verlon Au, MD   REFERRING PROVIDER: Edwin Cap, DPM   REFERRING DIAG: [phone] (ICD-10-CM) - Achilles tendinitis of left lower extremity   THERAPY DIAG:  Pain in left ankle and joints of left foot  Muscle weakness (generalized)  Difficulty in walking, not elsewhere classified  Rationale for Evaluation and Treatment: Rehabilitation  ONSET DATE: May 2024   SUBJECTIVE:   SUBJECTIVE STATEMENT:   Pt reports that she has had very little pain in her achilles "it's good".  She is having some sciatic pain.   PERTINENT HISTORY:  DM, asthma, HTN, CPAP for OSA, R foot surgery PAIN:  Are you having pain? 0/10  PRECAUTIONS: None  RED FLAGS: Bowel or bladder incontinence: No   WEIGHT BEARING RESTRICTIONS: No  FALLS:  Has patient fallen in last 6 months? No  LIVING ENVIRONMENT: Lives with: lives alone Lives in: House/apartment Stairs: No Has following equipment at home: None  OCCUPATION: retired  PLOF: Independent  PATIENT GOALS: Stop pain so I can get back to walking. I walk 30-45 minutes for exercise but when I have pain I cannot walk.  NEXT MD VISIT: TBD  OBJECTIVE:    DIAGNOSTIC FINDINGS: Multiple views x-ray of the left foot: no fracture, dislocation, swelling or degenerative changes noted and calcification of distal Achilles insertion   PATIENT SURVEYS:  FOTO 55%  predicted 67% 10/02/23 77%  COGNITION: Overall cognitive status: Within functional limits for tasks assessed     SENSATION: WFL   MUSCLE LENGTH: Hamstrings: Right 69 deg; Left 69 deg   POSTURE: rounded shoulders, forward head, and flexed trunk   PALPATION: TTP in mid achilles tendon on Left  LOWER EXTREMITY ROM:  Active ROM Right eval Left eval  Hip flexion    Hip extension    Hip abduction    Hip adduction    Hip internal rotation    Hip external rotation    Knee flexion 122/PROM 130 122/ PROM 130  Knee extension    Ankle dorsiflexion 10 +2  Ankle plantarflexion 48 45  Ankle inversion 42 32  Ankle eversion 27 20   (Blank rows = not tested)  LOWER EXTREMITY MMT:  MMT Right eval Left eval  Hip flexion 4 4+  Hip extension 4 4  Hip abduction 4- 4-  Hip adduction    Hip internal rotation    Hip external rotation    Knee flexion 4 4  Knee extension 4 4  Ankle dorsiflexion    Ankle plantarflexion 20/25 12/25  Ankle inversion    Ankle eversion     (Blank rows = not tested)    FUNCTIONAL TESTS:  5 times sit to stand: 34.72 sec  2 minute walk test: 296 ft  ( norm 479 ft)  GAIT: Distance walked: see above 2 MWT  Assistive device utilized: None Level of assistance: Complete Independence Comments: Pt with slow cadence,  Pt transfer from seat with Rt wt bearing and states she feels strain on R low back with compensatory movements.    TODAY'S TREATMENT:      [facility] Adult PT Treatment:                                                DATE: 10/16/2023  Therapeutic Activity - collecting information for goals, checking progress, and reviewing with patient   [facility] Adult PT Treatment:                                                DATE:  10/09/2023  Therapeutic Exercise: Recumbent bike level 1-3 x 5 min total  Standing heel raises BIL 2 x 10 at counter  Standing toe raises BIL 2 x 10 at counter  Ankle 4-way with blue TB x 15 each way  Seated calf stretch 2 x 30 sec  TM walking incline 3-7% to address stride length  Followed by cueing for improved stride length   Manual Therapy: STM/IASTYM L gastroc and achilles insertion 8 min   [facility] Adult PT Treatment:                                                DATE: 10/07/2023  Therapeutic Exercise: Recumbent bike level 2 x 5 min  Standing heel raises BIL 2 x 10 at counter  Standing toe raises BIL 2 x 10 at counter   Manual Therapy: STM/IASTYM L gastroc and achilles insertion 8 min     PATIENT EDUCATION:  Education details: POC Explanation of findings Person educated: Patient Education method: Explanation, Demonstration, Tactile cues, Verbal cues, and Handouts Education comprehension: verbalized understanding, returned demonstration, verbal cues required, tactile cues required, and needs further education  HOME EXERCISE PROGRAM:  Access Code: [email or web address] URL: [email or web address] Date: 10/14/2023 Prepared by: Mauri Reading  Exercises - Gastroc Stretch on Wall  - 1 x daily - 7 x weekly - 3 sets - 30 sec hold - Seated Plantar Fascia Mobilization with Small Ball  - 1 x daily - 7 x weekly - 2 minute hold - Seated Plantar Fascia Stretch  - 1 x daily - 7 x weekly - 2 sets - 30 hold - Seated Ankle Plantarflexion with Resistance  - 1 x daily - 3 x weekly - 2 sets - 10 reps - 3 sec hold - Seated Ankle Dorsiflexion with Resistance  - 1 x daily - 3 x weekly - 2 sets - 10 reps - 3 sec hold - Seated Ankle Inversion with Resistance  - 1 x daily - 7 x weekly - 2 sets - 10 reps - 3 sec hold - Seated Ankle Eversion with Resistance  - 1 x daily - 7 x weekly - 2 sets - 10 reps - 3 sec hold  ASSESSMENT:  CLINICAL IMPRESSION:    REET ALLAN has progressed well with  therapy.  Improved impairments include: LE and plantarflexion strength, pain.  Functional improvements include: ability to walk and navigate steps.  Progressions needed include: continued work at home with HEP.  Barriers to progress include: sciatica.  Please see GOALS section for progress on short term and long term goals established at evaluation.  I recommend D/C home with HEP; pt agrees with plan.    OBJECTIVE IMPAIRMENTS: decreased activity tolerance, difficulty walking, decreased ROM, decreased strength, improper body mechanics, postural dysfunction, obesity, and pain.   ACTIVITY LIMITATIONS: bending, standing, squatting, sleeping, stairs, transfers, bed mobility, reach over head, locomotion level, and caring for others  PARTICIPATION LIMITATIONS: meal prep, cleaning, laundry, driving, shopping, and community activity  PERSONAL FACTORS: DM, asthma, HTN, CPAP for OSA, R foot surgery and obesity are also affecting patient's functional outcome.   REHAB POTENTIAL: Good  CLINICAL DECISION MAKING: Evolving/moderate complexity  EVALUATION COMPLEXITY: Moderate   GOALS: Goals reviewed with patient? Yes  SHORT TERM GOALS: Target date: 09-29-23 Pt will be independent with initial HEP Baseline:no knowledge Goal status: MET  2.  Pt will be able to get in and out of car with 25 % greater ease than on eval Baseline: 5 x STS 34.72 sec; 10/02/23 Still problematic but not from ankle region Goal status: MET  3.  Sit and stand with RT=LT wt bearing to reduce lumbar strain and allow for  increased tolerance for these positions for home tasks Baseline: Pt demo RT wt bear and strain of lumbar on eval Goal status: MET    LONG TERM GOALS: Target date: 10-22-23  Pt will be independent with advanced HEP Baseline: no knowledge Goal status: MET  2.  Pt will be able to demonstrate standing to floor transfer independently since she lives along Baseline: unable to perform floor to stand Goal status:  NOT MET d/t sciatica  3.  Pt will be able to complete 6 MWT for at least 1200 ft to show improved LE strength and locomotion Baseline: 5 times sit to stand: 34.72 sec  2 minute walk test: 296 ft  ( norm 479 ft) 10/25: 1134 ft no pain Goal status: Partially MET  4.  FOTO will improve from  55%  to  67%   indicating improved functional mobility.  Baseline: 55%; 10/02/23 77% Goal status: MET  5.  Pt will be able to negotiate steps without increasing pain greater than 2/10 Baseline: Pt avoids steps 10/25: MET Goal status: MET  6.  Pt will be able to perform 25/25 SL  heel raise without exacerbation of pain greater than 2/10 Baseline: Pt with 10/10 with SL heel raise 10/25: 25 bil - unable to perform SL d/t weakness, not pain Goal status: MET   PLAN:  PT FREQUENCY: 1-2x/week  PT DURATION: 8 weeks  PLANNED INTERVENTIONS: Therapeutic exercises, Therapeutic activity, Neuromuscular re-education, Balance training, Gait training, Patient/Family education, Self Care, Joint mobilization, Stair training, Dry Needling, Electrical stimulation, Cryotherapy, Moist heat, Taping, Ionotophoresis 4mg /ml Dexamethasone, Manual therapy, and Re-evaluation  PLAN FOR NEXT SESSION: progress with eccentric heel lowering and gasrocnemius , knee and hip strength.  Education on Achilles tendonitis   Fredderick Phenix PT 10/16/23 10:59 AM Phone: [phone] Fax: [phone]   Garen Lah, PT, A[facility] Certified Exercise Expert for the Aging Adult  11/11/23 2:13 PM Phone: [phone] Fax: [phone]

## 2023-10-21 ENCOUNTER — Ambulatory Visit: Payer: Medicare HMO

## 2023-10-22 ENCOUNTER — Ambulatory Visit: Payer: Medicare HMO | Admitting: Podiatry

## 2023-10-29 ENCOUNTER — Ambulatory Visit: Payer: Medicare HMO | Admitting: Podiatry

## 2023-11-26 ENCOUNTER — Encounter: Payer: Self-pay | Admitting: Podiatry

## 2023-11-26 ENCOUNTER — Ambulatory Visit: Payer: Medicare HMO | Admitting: Podiatry

## 2023-11-26 DIAGNOSIS — M21612 Bunion of left foot: Secondary | ICD-10-CM | POA: Diagnosis not present

## 2023-11-26 DIAGNOSIS — M19072 Primary osteoarthritis, left ankle and foot: Secondary | ICD-10-CM | POA: Diagnosis not present

## 2023-11-26 DIAGNOSIS — M7662 Achilles tendinitis, left leg: Secondary | ICD-10-CM | POA: Diagnosis not present

## 2023-11-26 NOTE — Progress Notes (Signed)
  Subjective:  Patient ID: [name] [name], female    DOB: 1946/03/28,  MRN: [id number]  Chief Complaint  Patient presents with   Routine Post Op    PATIENT IS HERE FOR A FOLLOW UP TO TALK ABOUT THERAPY, PATIENT STATES HER FOOT HAS BEEN FINE AND SHE HAS NOT HAD NO PROBLEMS SINCE LAST VISIT.     77 y.o. female presents with the above complaint. History confirmed with patient.  Achilles is doing much better she has completed therapy not having the pain.  The bunion is bothering her more and she would like to have it fixed after she finishes moving this month  Objective:  Physical Exam: warm, good capillary refill, no trophic changes or ulcerative lesions, normal DP and PT pulses, and normal sensory exam. Left Foot: Today no pain on palpation to the Achilles tendon she has good 5 out of 5 strength in all planes no pain at the insertion or mid substance.  Her equinus is improved.  She has hallux rigidus with dorsal spurring and medial spurring of the first MTP  Radiographs: Multiple views x-ray of the left foot: Prior x-rays of the left foot taken on 01/06/2022 show significant osteoarthritis of the left first metatarsophalangeal joint with dorsal and medial spurring Assessment:   1. Achilles tendinitis of left lower extremity   2. Bunion of great toe of left foot   3. Osteoarthritis of first metatarsophalangeal (MTP) joint of left foot      Plan:  Patient was evaluated and treated and all questions answered.  Achilles is doing much better and no longer bothersome we discussed the risk of recurrence and importance of daily exercise and stretching.  We again reviewed her previous left foot radiographs and she would like to have permanent fixation of her hallux rigidus and osteoarthritis.  Surgical we discussed metatarsal phalangeal joint fusion of the left foot with bone graft from the heel.  We discussed the risk benefits and potential complications of the procedure including but not limited to   pain, swelling, infection, scar, numbness which may be temporary or permanent, chronic pain, stiffness, nerve pain or damage, wound healing problems, bone healing problems including delayed or non-union.  We discussed the recovery process and need for nonweightbearing for at least 3 to 4 weeks and then transition to gradual weightbearing in a boot and its impact on her work schedule.  She understands wishes to proceed.  Her A1c remains well-controlled at 5.7%.  Surgery be scheduled for early next spring.   Surgical plan:  Procedure: -Left foot first metatarsophalangeal joint fusion  Location: -[facility]  Anesthesia plan: -IV sedation with regional block  Postoperative pain plan: - Tylenol 1000 mg every 6 hours, ibuprofen 600 mg every 6 hours, gabapentin 300 mg every 8 hours x5 days, oxycodone 5 mg 1-2 tabs every 6 hours only as needed  DVT prophylaxis: -None required  WB Restrictions / DME needs: -NWB in CAM boot postop    No follow-ups on file.

## 2023-11-30 ENCOUNTER — Telehealth: Payer: Self-pay | Admitting: Urology

## 2023-11-30 NOTE — Telephone Encounter (Signed)
Called and LM to call back when she is ready to schedule her sx with Dr. Lilian Kapur.

## 2024-06-09 ENCOUNTER — Ambulatory Visit: Admitting: Podiatry

## 2024-06-21 ENCOUNTER — Ambulatory Visit: Admitting: Podiatry

## 2024-07-12 ENCOUNTER — Encounter: Payer: Self-pay | Admitting: Podiatry

## 2024-07-12 ENCOUNTER — Ambulatory Visit: Admitting: Podiatry

## 2024-07-12 DIAGNOSIS — M7662 Achilles tendinitis, left leg: Secondary | ICD-10-CM | POA: Diagnosis not present

## 2024-07-12 NOTE — Patient Instructions (Signed)

## 2024-07-13 NOTE — Progress Notes (Signed)
  Subjective:  Patient ID: [name] [name], female    DOB: 11-12-1946,  MRN: [id number]  Chief Complaint  Patient presents with   Tendonitis    I'm having the same pain that I was having before in my left foot.    78 y.o. female presents with the above complaint. History confirmed with patient.  She returns for follow-up having pain in the Achilles again.  Had to cancel her bunion surgery due to high A1c of 8%  Objective:  Physical Exam: warm, good capillary refill, no trophic changes or ulcerative lesions, normal DP and PT pulses, and normal sensory exam. Left Foot: tenderness at Achilles tendon insertion and gastrocnemius equinus is noted with a positive silverskiold test   Radiographs: Multiple views x-ray of the left foot: no fracture, dislocation, swelling or degenerative changes noted and calcification of distal Achilles insertion Assessment:   1. Achilles tendinitis of left lower extremity      Plan:  Patient was evaluated and treated and all questions answered.  Discussed the etiology and treatment options for Achilles tendinitis including stretching, formal physical therapy with an eccentric exercises therapy plan, supportive shoegears such as a running shoe or sneaker, heel lifts, topical and oral medications.  We also discussed that I do not routinely perform injections in this area because of the risk of an increased damage or rupture of the tendon.  We also discussed the role of surgical treatment of this for patients who do not improve after exhausting non-surgical treatment options.  -XR reviewed with patient -Educated on stretching and icing of the affected limb. -At home physical therapy plan dispensed -If not improving or worsening we will proceed with Topaz and PRP which worked well on her other side  Return in about 6 weeks (around 08/23/2024) for re-check Achilles tendon.

## 2024-08-05 ENCOUNTER — Ambulatory Visit
Admission: RE | Admit: 2024-08-05 | Discharge: 2024-08-05 | Disposition: A | Discharge status: Home / Self Care | Source: Ambulatory Visit | Attending: Nurse Practitioner | Admitting: Nurse Practitioner

## 2024-08-05 ENCOUNTER — Other Ambulatory Visit: Payer: Self-pay | Admitting: Nurse Practitioner

## 2024-08-05 DIAGNOSIS — R7612 Nonspecific reaction to cell mediated immunity measurement of gamma interferon antigen response without active tuberculosis: Secondary | ICD-10-CM

## 2024-08-23 ENCOUNTER — Ambulatory Visit: Admitting: Podiatry

## 2024-12-31 ENCOUNTER — Emergency Department (HOSPITAL_COMMUNITY): Admission: EM | Admit: 2024-12-31 | Discharge: 2024-12-31 | Disposition: A | Discharge status: Home / Self Care

## 2024-12-31 ENCOUNTER — Emergency Department (HOSPITAL_COMMUNITY)

## 2024-12-31 ENCOUNTER — Emergency Department (HOSPITAL_COMMUNITY): Admit: 2024-12-31 | Discharge: 2024-12-31 | Disposition: A

## 2024-12-31 ENCOUNTER — Other Ambulatory Visit: Payer: Self-pay

## 2024-12-31 DIAGNOSIS — Z7984 Long term (current) use of oral hypoglycemic drugs: Secondary | ICD-10-CM | POA: Insufficient documentation

## 2024-12-31 DIAGNOSIS — M79604 Pain in right leg: Secondary | ICD-10-CM | POA: Insufficient documentation

## 2024-12-31 DIAGNOSIS — E119 Type 2 diabetes mellitus without complications: Secondary | ICD-10-CM | POA: Insufficient documentation

## 2024-12-31 MED ORDER — OXYCODONE HCL 5 MG PO TABS
5.0000 mg | ORAL_TABLET | ORAL | Status: AC
  Administered 2024-12-31: 5 mg via ORAL
  Filled 2024-12-31: qty 1

## 2024-12-31 NOTE — Discharge Instructions (Addendum)
 Your work appears overall reassuring.  You have no sign of infection.  Your x-ray is normal.  Your ultrasound is normal, this means you do not have a blood clot.  I would recommend ice and heat over the area of pain.  You can also use anti-inflammatories and Tylenol .  Please follow-up with your primary care doctor to ensure recheck of symptoms.  Return to the ED with new or worsening symptoms.

## 2024-12-31 NOTE — Progress Notes (Signed)
 Right lower extremity venous duplex has been completed. Preliminary results can be found in CV Proc through chart review.  Results were given to Warren Shad PA.  12/31/2024 2:15 PM Cathlyn Collet RVT

## 2024-12-31 NOTE — ED Triage Notes (Signed)
 Pt reports leg pain from thigh to bottom of foot, thought it was her sciatica until she noticed a lump on her right shin, pt c/o pain in area

## 2024-12-31 NOTE — ED Provider Notes (Signed)
 " [place] [facility] AT [facility] Provider Note   CSN: [id number] Arrival date & time: 12/31/24  0901     Patient presents with: Leg Pain   [name] [name] is a 79 y.o. female.  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  She reports this is chronic but does feel different today.  She describes the pain as a burning sensation that radiates down her leg.  She has noted a lump to her anterior shin, she is concerned for blood clot.  She is not anticoagulated.  She denies any history of DVT or PE.  She denies any chest pain or shortness of breath.  She does not have fever or rash.    Leg Pain      Prior to Admission medications  Medication Sig Start Date End Date Taking? Authorizing Provider  albuterol  (PROVENTIL  HFA;VENTOLIN  HFA) 108 (90 Base) MCG/ACT inhaler Inhale 2 puffs into the lungs every 6 (six) hours as needed for wheezing or shortness of breath. 07/04/17   Dhungel, Nishant, MD  Cholecalciferol (VITAMIN D) 125 MCG (5000 UT) CAPS Take by mouth.    [provider]  diclofenac  sodium (VOLTAREN ) 1 % GEL Apply 2 g topically 4 (four) times daily. 01/12/18   Petrucelli, Samantha R, PA-C  ferrous sulfate  325 (65 FE) MG tablet Take 1 tablet (325 mg total) by mouth 3 (three) times daily with meals. Patient taking differently: Take 325 mg by mouth daily. 07/04/17   Dhungel, Nishant, MD  gabapentin (NEURONTIN) 300 MG capsule Take by mouth. 04/03/23   [provider]  metFORMIN  (GLUCOPHAGE ) 1000 MG tablet Take 1 tablet (1,000 mg total) by mouth 2 (two) times daily with a meal. 07/04/17   Dhungel, Nishant, MD  OZEMPIC, 0.25 OR 0.5 MG/DOSE, 2 MG/3ML SOPN SMARTSIG:0.2 Milliliter(s) SUB-Q Once a Week 01/01/23   [provider]  rosuvastatin  (CRESTOR ) 10 MG tablet Take 1  tablet (10 mg total) by mouth daily. 06/17/21 09/15/21  Patwardhan, Newman PARAS, MD  vitamin B-12 (CYANOCOBALAMIN ) 1000 MCG tablet Take 1,000 mcg by mouth daily.    [provider]    Allergies: Patient has no known allergies.    Review of Systems  Musculoskeletal:  Positive for arthralgias.    Updated Vital Signs BP (!) 154/81 (BP Location: Right Arm)   Pulse 85   Temp 98.1 F (36.7 C) (Oral)   Resp 16   SpO2 98%   Physical Exam Vitals and nursing note reviewed.  Constitutional:      General: She is not in acute distress.    Appearance: She is not toxic-appearing.  HENT:     Head: Normocephalic and atraumatic.  Eyes:     General: No scleral icterus.    Conjunctiva/sclera: Conjunctivae normal.  Cardiovascular:     Rate and Rhythm: Normal rate and regular rhythm.     Pulses: Normal pulses.     Heart sounds: Normal heart sounds.  Pulmonary:     Effort: Pulmonary effort is normal. No respiratory distress.     Breath sounds: Normal breath sounds.  Abdominal:     General: Abdomen is flat. Bowel sounds are normal.     Palpations: Abdomen is soft.     Tenderness: There is no abdominal tenderness.  Musculoskeletal:     Comments: Right lower extremity: Small soft tissue nodule to  anterior shin.  No abscess or area of fluctuance.  No surrounding cellulitis.  Compartments are soft.  No obvious edema.  Strong dorsal pedal pulse.  Skin:    General: Skin is warm and dry.     Findings: No lesion.  Neurological:     General: No focal deficit present.     Mental Status: She is alert and oriented to person, place, and time. Mental status is at baseline.     (all labs ordered are listed, but only abnormal results are displayed) Labs Reviewed - No data to display  EKG: None  Radiology: VAS US  LOWER EXTREMITY VENOUS (DVT) (7a-5p) Result Date: 12/31/2024  Lower Venous DVT Study Patient Name:  [name] [name]  Date of Exam:   12/31/2024 Medical Rec #: [id number]    Accession #:     [id number] Date of Birth: 13-Sep-1946   Patient Gender: F Patient Age:   72 years Exam Location:  [name] Procedure:      VAS US  LOWER EXTREMITY VENOUS (DVT) Referring Phys: WARREN William Schake --------------------------------------------------------------------------------  Indications: Pain.  Risk Factors: None identified. Comparison Study: No prior studies. Performing Technologist: Cordella Collet RVT  Examination Guidelines: A complete evaluation includes B-mode imaging, spectral Doppler, color Doppler, and power Doppler as needed of all accessible portions of each vessel. Bilateral testing is considered an integral part of a complete examination. Limited examinations for reoccurring indications may be performed as noted. The reflux portion of the exam is performed with the patient in reverse Trendelenburg.  +---------+---------------+---------+-----------+----------+--------------+ RIGHT    CompressibilityPhasicitySpontaneityPropertiesThrombus Aging +---------+---------------+---------+-----------+----------+--------------+ CFV      Full           Yes      Yes                                 +---------+---------------+---------+-----------+----------+--------------+ SFJ      Full                                                        +---------+---------------+---------+-----------+----------+--------------+ FV Prox  Full                                                        +---------+---------------+---------+-----------+----------+--------------+ FV Mid   Full                                                        +---------+---------------+---------+-----------+----------+--------------+ FV DistalFull                                                        +---------+---------------+---------+-----------+----------+--------------+ PFV      Full                                                         +---------+---------------+---------+-----------+----------+--------------+  POP      Full           Yes      Yes                                 +---------+---------------+---------+-----------+----------+--------------+ PTV      Full                                                        +---------+---------------+---------+-----------+----------+--------------+ PERO     Full                                                        +---------+---------------+---------+-----------+----------+--------------+   +----+---------------+---------+-----------+----------+--------------+ LEFTCompressibilityPhasicitySpontaneityPropertiesThrombus Aging +----+---------------+---------+-----------+----------+--------------+ CFV Full           Yes      Yes                                 +----+---------------+---------+-----------+----------+--------------+     Summary: RIGHT: - There is no evidence of deep vein thrombosis in the lower extremity.  - No cystic structure found in the popliteal fossa.   *See table(s) above for measurements and observations.    Preliminary    DG Tibia/Fibula Right Result Date: 12/31/2024 CLINICAL DATA:  Right leg pain. EXAM: RIGHT TIBIA AND FIBULA - 2 VIEW COMPARISON:  None Available. FINDINGS: There is no evidence of fracture or other focal bone lesions. Mild right knee osteoarthritis noted. Soft tissues are unremarkable. IMPRESSION: No acute findings. Mild right knee osteoarthritis. Electronically Signed   By: Norleen DELENA Kil M.D.   On: 12/31/2024 13:05     Procedures   Medications Ordered in the ED  oxyCODONE  (Oxy IR/ROXICODONE ) immediate release tablet 5 mg (5 mg Oral Given 12/31/24 1236)                                    Medical Decision Making Amount and/or Complexity of Data Reviewed Radiology: ordered.  Risk Prescription drug management.   This patient presents to the ED for concern of low back pain/leg pain, this involves an extensive  number of treatment options, and is a complaint that carries with it a high risk of complications and morbidity.  The differential diagnosis includes MSK in nature, fracture, epidural hematoma/abscess, cauda equina syndrome, spinal stenosis, spinal malignancy, discitis, spinal infection, spondylitises/ spondylosis, conus medullaris, DDD of the back.   Imaging Studies ordered:  I ordered imaging studies including x-ray of the right tib-fib, DVT study I independently visualized and interpreted imaging which showed geta for acute findings I agree with the radiologist interpretation   Cardiac Monitoring: / EKG:  The patient was maintained on a cardiac monitor.     Problem List / ED Course / Critical interventions / Medication management  Patient presents with worsening right leg pain.  She does admit to a chronic right sided low back pain with radicular symptoms down her right leg.  She denies any saddle anesthesia or loss of  bowel or bladder.  No history of cancer or IV drug use.  No fever with this.  She is primarily concerned about her right leg pain.  She is neurovascularly intact.  She is able to ambulate.  She denies trauma.  I did obtain an x-ray of the area which shows no acute findings.  I also obtain DVT study which shows no acute findings. Rule out ddx including abscess, cauda equina syndrome, malignancy less likely given history of present illness. No red flag symptoms indication need for emergent MR at this time.   I ordered medication including oxycodone  for pain control Reevaluation of the patient after these medicines showed that the patient improved I have reviewed the patients home medicines and have made adjustments as needed Vitals have been stable throughout duration of stay.  She has had reassuring workup here.  She is feeling better after treatment.  Feel stable for discharge with close outpatient follow-up.  She was given return precautions.        Final diagnoses:   Right leg pain    ED Discharge Orders     None          Shermon Warren SAILOR, PA-C 12/31/24 1429    Neysa Caron PARAS, DO 12/31/24 1455  "

## 2025-01-05 ENCOUNTER — Other Ambulatory Visit: Payer: Self-pay | Admitting: Nurse Practitioner

## 2025-01-05 DIAGNOSIS — R229 Localized swelling, mass and lump, unspecified: Secondary | ICD-10-CM

## 2025-01-30 ENCOUNTER — Other Ambulatory Visit

## 2025-02-02 ENCOUNTER — Other Ambulatory Visit
